# Patient Record
Sex: Male | Born: 1985 | Race: White | Hispanic: No | Smoking: Current every day smoker
Health system: Southern US, Community
[De-identification: ages and names within clinical notes are randomized; demographics above are authoritative.]

## PROBLEM LIST (undated history)

## (undated) DIAGNOSIS — J45909 Unspecified asthma, uncomplicated: Secondary | ICD-10-CM

## (undated) DIAGNOSIS — K439 Ventral hernia without obstruction or gangrene: Secondary | ICD-10-CM

## (undated) DIAGNOSIS — F1910 Other psychoactive substance abuse, uncomplicated: Secondary | ICD-10-CM

## (undated) DIAGNOSIS — K219 Gastro-esophageal reflux disease without esophagitis: Secondary | ICD-10-CM

## (undated) DIAGNOSIS — R000 Tachycardia, unspecified: Secondary | ICD-10-CM

## (undated) DIAGNOSIS — M1990 Unspecified osteoarthritis, unspecified site: Secondary | ICD-10-CM

## (undated) DIAGNOSIS — R0600 Dyspnea, unspecified: Secondary | ICD-10-CM

## (undated) DIAGNOSIS — J189 Pneumonia, unspecified organism: Secondary | ICD-10-CM

## (undated) DIAGNOSIS — I2089 Other forms of angina pectoris: Secondary | ICD-10-CM

## (undated) DIAGNOSIS — I208 Other forms of angina pectoris: Secondary | ICD-10-CM

## (undated) HISTORY — PX: HERNIA REPAIR: SHX51

## (undated) HISTORY — PX: OTHER SURGICAL HISTORY: SHX169

---
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
## 2004-05-02 ENCOUNTER — Emergency Department: Payer: Self-pay | Admitting: Emergency Medicine

## 2004-07-29 ENCOUNTER — Emergency Department: Payer: Self-pay | Admitting: Unknown Physician Specialty

## 2004-09-05 ENCOUNTER — Emergency Department: Payer: Self-pay | Admitting: General Practice

## 2004-12-30 ENCOUNTER — Emergency Department: Payer: Self-pay | Admitting: Unknown Physician Specialty

## 2006-02-21 ENCOUNTER — Emergency Department: Payer: Self-pay | Admitting: Emergency Medicine

## 2006-05-04 ENCOUNTER — Emergency Department: Payer: Self-pay | Admitting: Emergency Medicine

## 2006-11-17 ENCOUNTER — Emergency Department: Payer: Self-pay | Admitting: Emergency Medicine

## 2007-01-01 ENCOUNTER — Emergency Department: Payer: Self-pay | Admitting: Emergency Medicine

## 2007-10-02 ENCOUNTER — Emergency Department: Payer: Self-pay | Admitting: Emergency Medicine

## 2008-09-10 ENCOUNTER — Emergency Department: Payer: Self-pay | Admitting: Emergency Medicine

## 2009-07-28 ENCOUNTER — Emergency Department: Payer: Self-pay | Admitting: Internal Medicine

## 2009-10-30 ENCOUNTER — Emergency Department (HOSPITAL_COMMUNITY): Admission: EM | Admit: 2009-10-30 | Discharge: 2009-10-30 | Payer: Self-pay | Admitting: Emergency Medicine

## 2009-10-31 ENCOUNTER — Emergency Department (HOSPITAL_COMMUNITY): Admission: EM | Admit: 2009-10-31 | Discharge: 2009-10-31 | Payer: Self-pay | Admitting: Emergency Medicine

## 2010-03-12 ENCOUNTER — Emergency Department (HOSPITAL_COMMUNITY)
Admission: EM | Admit: 2010-03-12 | Discharge: 2010-03-12 | Payer: Self-pay | Source: Home / Self Care | Admitting: Emergency Medicine

## 2010-05-09 ENCOUNTER — Emergency Department: Payer: Self-pay | Admitting: Emergency Medicine

## 2010-05-19 ENCOUNTER — Emergency Department: Payer: Self-pay | Admitting: Emergency Medicine

## 2010-05-26 ENCOUNTER — Emergency Department (HOSPITAL_COMMUNITY)
Admission: EM | Admit: 2010-05-26 | Discharge: 2010-05-26 | Payer: Self-pay | Source: Home / Self Care | Admitting: Emergency Medicine

## 2010-08-16 LAB — RPR: RPR Ser Ql: NONREACTIVE

## 2010-08-28 ENCOUNTER — Emergency Department (HOSPITAL_COMMUNITY)
Admission: EM | Admit: 2010-08-28 | Discharge: 2010-08-29 | Disposition: A | Discharge status: Home / Self Care | Payer: Self-pay | Attending: Emergency Medicine | Admitting: Emergency Medicine

## 2010-08-28 DIAGNOSIS — E860 Dehydration: Secondary | ICD-10-CM | POA: Insufficient documentation

## 2010-08-28 DIAGNOSIS — R5383 Other fatigue: Secondary | ICD-10-CM | POA: Insufficient documentation

## 2010-08-28 DIAGNOSIS — IMO0001 Reserved for inherently not codable concepts without codable children: Secondary | ICD-10-CM | POA: Insufficient documentation

## 2010-08-28 DIAGNOSIS — R5381 Other malaise: Secondary | ICD-10-CM | POA: Insufficient documentation

## 2010-08-28 LAB — COMPREHENSIVE METABOLIC PANEL
ALT: 16 U/L (ref 0–53)
AST: 22 U/L (ref 0–37)
Albumin: 4 g/dL (ref 3.5–5.2)
Alkaline Phosphatase: 91 U/L (ref 39–117)
BUN: 8 mg/dL (ref 6–23)
CO2: 27 mEq/L (ref 19–32)
Calcium: 9.1 mg/dL (ref 8.4–10.5)
Chloride: 96 mEq/L (ref 96–112)
Creatinine, Ser: 1.06 mg/dL (ref 0.4–1.5)
GFR calc Af Amer: >60 mL/min (ref >60)
GFR calc non Af Amer: >60 mL/min (ref >60)
Glucose, Bld: 102 mg/dL — ABNORMAL HIGH (ref 70–99)
Potassium: 3.6 mEq/L (ref 3.5–5.1)
Sodium: 132 mEq/L — ABNORMAL LOW (ref 135–145)
Total Bilirubin: 0.3 mg/dL (ref 0.3–1.2)
Total Protein: 7.1 g/dL (ref 6.0–8.3)

## 2010-08-28 LAB — URINALYSIS, ROUTINE W REFLEX MICROSCOPIC
Bilirubin Urine: NEGATIVE
Nitrite: NEGATIVE
Specific Gravity, Urine: 1.01 (ref 1.005–1.030)
Urobilinogen, UA: 1 mg/dL (ref 0.0–1.0)
pH: 7 (ref 5.0–8.0)

## 2010-08-28 LAB — CBC
HCT: 40.1 % (ref 39.0–52.0)
MCV: 90.5 fL (ref 78.0–100.0)
Platelets: 249 10*3/uL (ref 150–400)
RBC: 4.43 MIL/uL (ref 4.22–5.81)
WBC: 11.4 10*3/uL — ABNORMAL HIGH (ref 4.0–10.5)

## 2010-08-28 LAB — DIFFERENTIAL
Basophils Absolute: 0.1 10*3/uL (ref 0.0–0.1)
Eosinophils Absolute: 0.2 10*3/uL (ref 0.0–0.7)
Lymphocytes Relative: 12 % (ref 12–46)
Lymphs Abs: 1.3 10*3/uL (ref 0.7–4.0)
Neutrophils Relative %: 79 % — ABNORMAL HIGH (ref 43–77)

## 2011-04-22 ENCOUNTER — Emergency Department: Payer: Self-pay | Admitting: Emergency Medicine

## 2011-08-02 ENCOUNTER — Emergency Department: Payer: Self-pay | Admitting: Emergency Medicine

## 2011-08-02 LAB — CBC WITH DIFFERENTIAL/PLATELET
Basophil %: 1.2 %
Eosinophil %: 6.3 %
HCT: 40 % (ref 40.0–52.0)
Lymphocyte %: 36.4 %
Monocyte %: 7.8 %
Neutrophil %: 48.3 %
RDW: 13.7 % (ref 11.5–14.5)
WBC: 7 10*3/uL (ref 3.8–10.6)

## 2011-08-02 LAB — URINALYSIS, COMPLETE
Bacteria: NONE SEEN
Bilirubin,UR: NEGATIVE
Glucose,UR: NEGATIVE mg/dL (ref 0–75)
Ketone: NEGATIVE
Leukocyte Esterase: NEGATIVE
Ph: 7 (ref 4.5–8.0)
RBC,UR: 1 /HPF (ref 0–5)
Squamous Epithelial: NONE SEEN
WBC UR: NONE SEEN /HPF (ref 0–5)

## 2011-08-02 LAB — BASIC METABOLIC PANEL
Anion Gap: 12 (ref 7–16)
BUN: 9 mg/dL (ref 7–18)
Calcium, Total: 9 mg/dL (ref 8.5–10.1)
Chloride: 103 mmol/L (ref 98–107)
Co2: 26 mmol/L (ref 21–32)
Osmolality: 280 (ref 275–301)

## 2011-08-04 LAB — URINE CULTURE

## 2012-03-22 ENCOUNTER — Emergency Department: Payer: Self-pay | Admitting: Emergency Medicine

## 2012-08-25 ENCOUNTER — Emergency Department: Payer: Self-pay | Admitting: Unknown Physician Specialty

## 2012-08-25 LAB — CBC WITH DIFFERENTIAL/PLATELET
Basophil #: 0.1 10*3/uL (ref 0.0–0.1)
Basophil %: 1.4 %
Eosinophil #: 0.4 10*3/uL (ref 0.0–0.7)
Eosinophil %: 6.4 %
HCT: 39.5 % — ABNORMAL LOW (ref 40.0–52.0)
MCH: 31 pg (ref 26.0–34.0)
MCHC: 33.3 g/dL (ref 32.0–36.0)
MCV: 93 fL (ref 80–100)
Monocyte #: 0.5 x10 3/mm (ref 0.2–1.0)
Monocyte %: 8.5 %
Neutrophil #: 2.8 10*3/uL (ref 1.4–6.5)
Neutrophil %: 45.9 %
WBC: 6 10*3/uL (ref 3.8–10.6)

## 2012-08-25 LAB — BASIC METABOLIC PANEL
BUN: 13 mg/dL (ref 7–18)
Co2: 30 mmol/L (ref 21–32)
EGFR (Non-African Amer.): >60
Osmolality: 275 (ref 275–301)
Potassium: 3.9 mmol/L (ref 3.5–5.1)

## 2012-09-20 ENCOUNTER — Emergency Department: Payer: Self-pay | Admitting: Emergency Medicine

## 2012-09-20 LAB — COMPREHENSIVE METABOLIC PANEL
Albumin: 4.6 g/dL (ref 3.4–5.0)
Alkaline Phosphatase: 84 U/L (ref 50–136)
Anion Gap: 8 (ref 7–16)
BUN: 8 mg/dL (ref 7–18)
Bilirubin,Total: 0.3 mg/dL (ref 0.2–1.0)
Chloride: 108 mmol/L — ABNORMAL HIGH (ref 98–107)
Co2: 25 mmol/L (ref 21–32)
EGFR (African American): >60
EGFR (Non-African Amer.): >60
SGOT(AST): 26 U/L (ref 15–37)

## 2012-09-20 LAB — CBC
HGB: 15.2 g/dL (ref 13.0–18.0)
MCH: 32.6 pg (ref 26.0–34.0)
MCHC: 34.9 g/dL (ref 32.0–36.0)
Platelet: 297 10*3/uL (ref 150–440)
WBC: 7.3 10*3/uL (ref 3.8–10.6)

## 2012-09-20 LAB — DRUG SCREEN, URINE
Amphetamines, Ur Screen: NEGATIVE (ref ?–1000)
Barbiturates, Ur Screen: NEGATIVE (ref ?–200)
Benzodiazepine, Ur Scrn: NEGATIVE (ref ?–200)
Cannabinoid 50 Ng, Ur ~~LOC~~: POSITIVE (ref ?–50)
Cocaine Metabolite,Ur ~~LOC~~: NEGATIVE (ref ?–300)
MDMA (Ecstasy)Ur Screen: NEGATIVE (ref ?–500)
Methadone, Ur Screen: NEGATIVE (ref ?–300)
Opiate, Ur Screen: NEGATIVE (ref ?–300)

## 2012-09-20 LAB — TSH: Thyroid Stimulating Horm: 3.17 u[IU]/mL

## 2012-09-20 LAB — URINALYSIS, COMPLETE
Bacteria: NONE SEEN
Glucose,UR: NEGATIVE mg/dL (ref 0–75)
Leukocyte Esterase: NEGATIVE
Nitrite: NEGATIVE
Ph: 5 (ref 4.5–8.0)
RBC,UR: 74 /HPF (ref 0–5)
Squamous Epithelial: <1
WBC UR: NONE SEEN /HPF (ref 0–5)

## 2012-09-20 LAB — ETHANOL: Ethanol: 143 mg/dL

## 2013-01-14 ENCOUNTER — Emergency Department: Payer: Self-pay | Admitting: Emergency Medicine

## 2013-01-14 LAB — COMPREHENSIVE METABOLIC PANEL
Albumin: 3.9 g/dL (ref 3.4–5.0)
Alkaline Phosphatase: 103 U/L (ref 50–136)
BUN: 12 mg/dL (ref 7–18)
Chloride: 105 mmol/L (ref 98–107)
EGFR (Non-African Amer.): >60
Glucose: 73 mg/dL (ref 65–99)
SGOT(AST): 25 U/L (ref 15–37)
SGPT (ALT): 25 U/L (ref 12–78)

## 2013-01-14 LAB — CBC WITH DIFFERENTIAL/PLATELET
Basophil %: 5 %
Lymphocyte %: 37.1 %
MCH: 32 pg (ref 26.0–34.0)
Monocyte #: 0.4 x10 3/mm (ref 0.2–1.0)
Platelet: 261 10*3/uL (ref 150–440)
RBC: 4.57 10*6/uL (ref 4.40–5.90)
RDW: 12.8 % (ref 11.5–14.5)

## 2013-12-21 ENCOUNTER — Emergency Department: Payer: Self-pay | Admitting: Emergency Medicine

## 2014-06-07 ENCOUNTER — Emergency Department: Payer: Self-pay | Admitting: Emergency Medicine

## 2017-01-17 ENCOUNTER — Encounter: Payer: Self-pay | Admitting: Emergency Medicine

## 2017-01-17 ENCOUNTER — Emergency Department: Payer: Self-pay

## 2017-01-17 ENCOUNTER — Emergency Department
Admission: EM | Admit: 2017-01-17 | Discharge: 2017-01-17 | Disposition: A | Discharge status: Home / Self Care | Payer: Self-pay | Attending: Emergency Medicine | Admitting: Emergency Medicine

## 2017-01-17 DIAGNOSIS — R079 Chest pain, unspecified: Secondary | ICD-10-CM | POA: Insufficient documentation

## 2017-01-17 DIAGNOSIS — F17200 Nicotine dependence, unspecified, uncomplicated: Secondary | ICD-10-CM | POA: Insufficient documentation

## 2017-01-17 LAB — TROPONIN I: Troponin I: <0.03 ng/mL (ref <0.03)

## 2017-01-17 LAB — BASIC METABOLIC PANEL
ANION GAP: 8 (ref 5–15)
BUN: 20 mg/dL (ref 6–20)
CO2: 27 mmol/L (ref 22–32)
Calcium: 9.6 mg/dL (ref 8.9–10.3)
Chloride: 101 mmol/L (ref 101–111)
Creatinine, Ser: 1.21 mg/dL (ref 0.61–1.24)
GFR calc Af Amer: >60 mL/min (ref >60)
GFR calc non Af Amer: >60 mL/min (ref >60)
GLUCOSE: 97 mg/dL (ref 65–99)
POTASSIUM: 3.7 mmol/L (ref 3.5–5.1)
Sodium: 136 mmol/L (ref 135–145)

## 2017-01-17 LAB — CBC
HEMATOCRIT: 42.6 % (ref 40.0–52.0)
HEMOGLOBIN: 14.8 g/dL (ref 13.0–18.0)
MCH: 32.2 pg (ref 26.0–34.0)
MCHC: 34.7 g/dL (ref 32.0–36.0)
MCV: 92.9 fL (ref 80.0–100.0)
PLATELETS: 336 10*3/uL (ref 150–440)
RBC: 4.59 MIL/uL (ref 4.40–5.90)
RDW: 13.8 % (ref 11.5–14.5)
WBC: 6.2 10*3/uL (ref 3.8–10.6)

## 2017-01-17 MED ORDER — IBUPROFEN 600 MG PO TABS: 600.0000 mg | ORAL_TABLET | Freq: Four times a day (QID) | ORAL | 0 refills | Status: DC | PRN

## 2017-01-17 MED ORDER — KETOROLAC TROMETHAMINE 60 MG/2ML IM SOLN
60.0000 mg | Freq: Once | INTRAMUSCULAR | Status: AC
  Administered 2017-01-17: 60 mg via INTRAMUSCULAR
  Filled 2017-01-17: qty 2

## 2017-01-17 NOTE — ED Provider Notes (Signed)
[facility] Emergency Department Provider Note  Time seen: 6:44 PM  I have reviewed the triage vital signs and the nursing notes.   HISTORY  Chief Complaint Chest Pain and Pleurisy    HPI Kenneth Bautista is a 31 y.o. male with no past medical history presents to the emergency department for chest pain. According to the patient since yesterday morning he has been experiencing central chest discomfort which he states is worse with movement, deep breath or. Presses on the area. Denies any cough or congestion. Denies any leg pain or swelling. Denies fever. Denies any increased exertion. Denies any trauma. Describes his chest pain is mild to moderate sharp type pain.  History reviewed. No pertinent past medical history.  There are no active problems to display for this patient.   History reviewed. No pertinent surgical history.  Prior to Admission medications   Not on File    No Known Allergies  No family history on file.  Social History Social History  Substance Use Topics  . Smoking status: Current Every Day Smoker  . Smokeless tobacco: Never Used  . Alcohol use Yes    Review of Systems Constitutional: Negative for fever. Cardiovascular: Positive for central chest pain worse with palpation or movement Respiratory: Negative for shortness of breath. Gastrointestinal: Negative for abdominal pain Musculoskeletal: No leg pain or swelling. Neurological: Negative for headache All other ROS negative  ____________________________________________   PHYSICAL EXAM:  VITAL SIGNS: ED Triage Vitals [01/17/17 1611]  Enc Vitals Group     BP 122/71     Pulse Rate 61     Resp 20     Temp 98.2 F (36.8 C)     Temp Source Oral     SpO2 100 %     Weight 140 lb (63.5 kg)     Height 5\' 6"  (1.676 m)     Head Circumference      Peak Flow      Pain Score 6     Pain Loc      Pain Edu?      Excl. in GC?     Constitutional: Alert and oriented. Well  appearing and in no distress. Eyes: Normal exam ENT   Head: Normocephalic and atraumatic   Mouth/Throat: Mucous membranes are moist. Cardiovascular: Normal rate, regular rhythm. No murmur Respiratory: Normal respiratory effort without tachypnea nor retractions. Breath sounds are clear. Extremely reproducible central chest pain. Gastrointestinal: Soft and nontender. No distention.   Musculoskeletal: Nontender with normal range of motion in all extremities. No leg edema or tenderness. Neurologic:  Normal speech and language. No gross focal neurologic deficits  Skin:  Skin is warm, dry and intact.  Psychiatric: Mood and affect are normal.  ____________________________________________    EKG  EKG reviewed and interpreted by myself shows normal sinus rhythm at 63 bpm, narrow QRS, normal axis, normal intervals, no ST changes. Normal EKG  ____________________________________________    RADIOLOGY  X-ray negative  ____________________________________________   INITIAL IMPRESSION / ASSESSMENT AND PLAN / ED COURSE  Pertinent labs & imaging results that were available during my care of the patient were reviewed by me and considered in my medical decision making (see chart for details).  Patient presents to the emergency department with chest pain, chest pain is extremely reproducible. Patient's labs are negative including cardiac enzymes. Chest x-ray negative. We will monitor on cardiac monitor, does Toradol for pain control. Reassuringly the patient's vitals are normal. PERC negative.  Patient state his pain  is much improved after Toradol. We will discharge home with ibuprofen. Patient's pain very suggestive Musca skeletal pain.  ____________________________________________   FINAL CLINICAL IMPRESSION(S) / ED DIAGNOSES  Chest pain   Minna Antis, MD 01/17/17 [phone]

## 2017-01-17 NOTE — Discharge Instructions (Signed)
You have been seen in the emergency department today for chest pain. Your workup has shown normal results. As we discussed please follow-up with your primary care physician in the next 1-2 days for recheck. Return to the emergency department for any further chest pain, trouble breathing, or any other symptom personally concerning to yourself. °

## 2017-01-17 NOTE — ED Triage Notes (Signed)
Presents with upper chest discomfort for 2 days   States pain is increased with inspiration and touch

## 2018-07-25 ENCOUNTER — Encounter: Payer: Self-pay | Admitting: Emergency Medicine

## 2018-07-25 ENCOUNTER — Emergency Department: Payer: Self-pay

## 2018-07-25 ENCOUNTER — Emergency Department
Admission: EM | Admit: 2018-07-25 | Discharge: 2018-07-25 | Disposition: A | Discharge status: Home / Self Care | Payer: Self-pay | Attending: Emergency Medicine | Admitting: Emergency Medicine

## 2018-07-25 ENCOUNTER — Other Ambulatory Visit: Payer: Self-pay

## 2018-07-25 DIAGNOSIS — F17210 Nicotine dependence, cigarettes, uncomplicated: Secondary | ICD-10-CM | POA: Insufficient documentation

## 2018-07-25 DIAGNOSIS — J209 Acute bronchitis, unspecified: Secondary | ICD-10-CM | POA: Insufficient documentation

## 2018-07-25 MED ORDER — AZITHROMYCIN 500 MG PO TABS
500.0000 mg | ORAL_TABLET | Freq: Once | ORAL | Status: AC
  Administered 2018-07-25: 500 mg via ORAL
  Filled 2018-07-25: qty 1

## 2018-07-25 MED ORDER — ALBUTEROL SULFATE HFA 108 (90 BASE) MCG/ACT IN AERS: 2.0000 | INHALATION_SPRAY | Freq: Four times a day (QID) | RESPIRATORY_TRACT | 0 refills | Status: DC | PRN

## 2018-07-25 MED ORDER — BENZONATATE 100 MG PO CAPS: 100.0000 mg | ORAL_CAPSULE | Freq: Three times a day (TID) | ORAL | 0 refills | Status: DC | PRN

## 2018-07-25 MED ORDER — ALBUTEROL SULFATE (2.5 MG/3ML) 0.083% IN NEBU: 3.0000 mL | INHALATION_SOLUTION | Freq: Once | RESPIRATORY_TRACT | Status: DC

## 2018-07-25 MED ORDER — AZITHROMYCIN 500 MG PO TABS: 500.0000 mg | ORAL_TABLET | Freq: Every day | ORAL | 0 refills | Status: AC

## 2018-07-25 MED ORDER — BENZONATATE 100 MG PO CAPS
100.0000 mg | ORAL_CAPSULE | Freq: Once | ORAL | Status: AC
  Administered 2018-07-25: 100 mg via ORAL
  Filled 2018-07-25: qty 1

## 2018-07-25 NOTE — ED Provider Notes (Signed)
[facility] Emergency Department Provider Note ____   First MD Initiated Contact with Patient 07/25/18 2251     (approximate)  I have reviewed the triage vital signs and the nursing notes.   HISTORY  Chief Complaint Chest Pain and Cough   HPI Kenneth Bautista is a 33 y.o. male   presents to the emergency department 1 day history of cough congestion.  Patient states half a pack per day cigarette smoking.  Patient denies any fever afebrile on presentation temperature 98.7.  Patient denies taking any medication at home for his symptoms.    History reviewed. No pertinent past medical history.  There are no active problems to display for this patient.   History reviewed. No pertinent surgical history.  Prior to Admission medications   Medication Sig Start Date End Date Taking? Authorizing Provider  ibuprofen (ADVIL,MOTRIN) 600 MG tablet Take 1 tablet (600 mg total) by mouth every 6 (six) hours as needed. 01/17/17   Minna Antis, MD    Allergies Patient has no known allergies.  History reviewed. No pertinent family history.  Social History Social History   Tobacco Use  . Smoking status: Current Every Day Smoker  . Smokeless tobacco: Never Used  Substance Use Topics  . Alcohol use: Yes  . Drug use: Yes    Types: Marijuana    Review of Systems Constitutional: No fever/chills Eyes: No visual changes. ENT: No sore throat. Cardiovascular: Denies chest pain. Respiratory: Denies shortness of breath. Positive for cough Gastrointestinal: No abdominal pain.  No nausea, no vomiting.  No diarrhea.  No constipation. Genitourinary: Negative for dysuria. Musculoskeletal: Negative for neck pain.  Negative for back pain. Integumentary: Negative for rash. Neurological: Negative for headaches, focal weakness or numbness.  ____________________________________________   PHYSICAL EXAM:  VITAL SIGNS: ED Triage Vitals  Enc Vitals Group     BP  07/25/18 1717 117/60     Pulse Rate 07/25/18 1717 77     Resp 07/25/18 1717 (!) 22     Temp 07/25/18 1717 98.7 F (37.1 C)     Temp Source 07/25/18 1717 Oral     SpO2 07/25/18 1717 99 %     Weight 07/25/18 1718 65.8 kg (145 lb)     Height 07/25/18 1718 1.676 m (5\' 6" )     Head Circumference --      Peak Flow --      Pain Score 07/25/18 1718 8     Pain Loc --      Pain Edu? --      Excl. in GC? --     Constitutional: Alert and oriented. Well appearing and in no acute distress. Eyes: Conjunctivae are normal. PERRL. EOMI. Mouth/Throat: Mucous membranes are moist. Oropharynx non-erythematous. Neck: No stridor.   Cardiovascular: Normal rate, regular rhythm. Good peripheral circulation. Grossly normal heart sounds. Respiratory: Normal respiratory effort.  No retractions. Lungs CTAB. Gastrointestinal: Soft and nontender. No distention.  Musculoskeletal: No lower extremity tenderness nor edema. No gross deformities of extremities. Neurologic:  Normal speech and language. No gross focal neurologic deficits are appreciated.  Skin:  Skin is warm, dry and intact. No rash noted. Psychiatric: Mood and affect are normal. Speech and behavior are normal.  ___________  EKG ED ECG REPORT I, Mahar[place] BROWN, the attending physician, personally viewed and interpreted this ECG.   Date: 07/25/2018  EKG Time: 5:27 PM  Rate: 78  Rhythm: Normal sinus rhythm  Axis: Normal  Intervals: Normal  ST&T Change: None  __________________  RADIOLOGY I, Darci Current, personally viewed and evaluated these images (plain radiographs) as part of my medical decision making, as well as reviewing the written report by the radiologist.  ED MD interpretation: Mild bronchitic changes on chest x-ray per radiologist  Official radiology report(s): Dg Chest 2 View  Result Date: 07/25/2018 CLINICAL DATA:  Cough, chest pain EXAM: CHEST - 2 VIEW COMPARISON:  05/25/2018 FINDINGS: Mild peribronchial thickening.  Heart and mediastinal contours are within normal limits. No focal opacities or effusions. No acute bony abnormality. IMPRESSION: Mild bronchitic changes. Electronically Signed   By: Charlett Nose M.D.   On: 07/25/2018 18:46    ____________________________________________    Procedures   ____________________________________________   INITIAL IMPRESSION / MDM / ASSESSMENT AND PLAN / ED COURSE  As part of my medical decision making, I reviewed the following data within the electronic MEDICAL RECORD NUMBER   33 year old male presenting with above-stated history and physical exam with differential diagnosis including pneumonia versus bronchitis.  Chest x-ray consistent with bronchitis.  Patient given Tessalon and azithromycin and albuterol inhaler with recommendation to discontinue smoking. ____________________________________________  FINAL CLINICAL IMPRESSION(S) / ED DIAGNOSES  Final diagnoses:  Acute bronchitis, unspecified organism     MEDICATIONS GIVEN DURING THIS VISIT:  Medications  albuterol (PROVENTIL HFA;VENTOLIN HFA) 108 (90 Base) MCG/ACT inhaler 2 puff (has no administration in time range)  azithromycin (ZITHROMAX) tablet 500 mg (has no administration in time range)  benzonatate (TESSALON) capsule 100 mg (has no administration in time range)     ED Discharge Orders    None       Note:  This document was prepared using Dragon voice recognition software and may include unintentional dictation errors.   Darci Current, MD 07/25/18 [phone]

## 2018-07-25 NOTE — ED Triage Notes (Signed)
Pt to ER with c/o cough, chest pain that worsens with deep inspiration.  PT states cough becomes worse when he walks.  PT states "slept all day yesterday because of this".  Pt reports cough productive of white sputum.

## 2019-06-24 ENCOUNTER — Ambulatory Visit: Payer: Self-pay | Attending: Internal Medicine

## 2019-06-24 DIAGNOSIS — Z8616 Personal history of COVID-19: Secondary | ICD-10-CM

## 2019-06-24 DIAGNOSIS — Z20822 Contact with and (suspected) exposure to covid-19: Secondary | ICD-10-CM

## 2019-06-24 DIAGNOSIS — U071 COVID-19: Secondary | ICD-10-CM | POA: Insufficient documentation

## 2019-06-24 HISTORY — DX: Personal history of COVID-19: Z86.16

## 2019-06-25 LAB — NOVEL CORONAVIRUS, NAA: SARS-CoV-2, NAA: DETECTED — AB

## 2019-06-28 ENCOUNTER — Other Ambulatory Visit: Payer: Self-pay

## 2019-06-28 ENCOUNTER — Emergency Department
Admission: EM | Admit: 2019-06-28 | Discharge: 2019-06-28 | Disposition: A | Discharge status: Home / Self Care | Payer: Self-pay | Attending: Emergency Medicine | Admitting: Emergency Medicine

## 2019-06-28 ENCOUNTER — Emergency Department: Payer: Self-pay

## 2019-06-28 DIAGNOSIS — F17200 Nicotine dependence, unspecified, uncomplicated: Secondary | ICD-10-CM | POA: Insufficient documentation

## 2019-06-28 DIAGNOSIS — U071 COVID-19: Secondary | ICD-10-CM | POA: Insufficient documentation

## 2019-06-28 DIAGNOSIS — J45909 Unspecified asthma, uncomplicated: Secondary | ICD-10-CM | POA: Insufficient documentation

## 2019-06-28 HISTORY — DX: Unspecified osteoarthritis, unspecified site: M19.90

## 2019-06-28 LAB — BASIC METABOLIC PANEL
Anion gap: 10 (ref 5–15)
BUN: 12 mg/dL (ref 6–20)
CO2: 27 mmol/L (ref 22–32)
Calcium: 8.8 mg/dL — ABNORMAL LOW (ref 8.9–10.3)
Chloride: 102 mmol/L (ref 98–111)
Creatinine, Ser: 0.96 mg/dL (ref 0.61–1.24)
GFR calc Af Amer: >60 mL/min (ref >60)
GFR calc non Af Amer: >60 mL/min (ref >60)
Glucose, Bld: 78 mg/dL (ref 70–99)
Potassium: 3.6 mmol/L (ref 3.5–5.1)
Sodium: 139 mmol/L (ref 135–145)

## 2019-06-28 LAB — CBC
HCT: 42.5 % (ref 39.0–52.0)
Hemoglobin: 14.6 g/dL (ref 13.0–17.0)
MCH: 31.7 pg (ref 26.0–34.0)
MCHC: 34.4 g/dL (ref 30.0–36.0)
MCV: 92.4 fL (ref 80.0–100.0)
Platelets: 279 10*3/uL (ref 150–400)
RBC: 4.6 MIL/uL (ref 4.22–5.81)
RDW: 12.9 % (ref 11.5–15.5)
WBC: 5.6 10*3/uL (ref 4.0–10.5)
nRBC: 0 % (ref 0.0–0.2)

## 2019-06-28 LAB — TROPONIN I (HIGH SENSITIVITY): Troponin I (High Sensitivity): <2 ng/L (ref <18)

## 2019-06-28 MED ORDER — ALBUTEROL SULFATE HFA 108 (90 BASE) MCG/ACT IN AERS: 2.0000 | INHALATION_SPRAY | Freq: Four times a day (QID) | RESPIRATORY_TRACT | 0 refills | Status: DC | PRN

## 2019-06-28 MED ORDER — AZITHROMYCIN 250 MG PO TABS: ORAL_TABLET | ORAL | 0 refills | Status: DC

## 2019-06-28 MED ORDER — SODIUM CHLORIDE 0.9% FLUSH: 3.0000 mL | Freq: Once | INTRAVENOUS | Status: DC

## 2019-06-28 MED ORDER — PREDNISONE 10 MG (21) PO TBPK: ORAL_TABLET | ORAL | 0 refills | Status: DC

## 2019-06-28 NOTE — ED Notes (Signed)
See triage note  Presents with some chest discomfort   States was tested positive for COVID last Friday  But states having sharp pain to chest   Afebrile on arrival

## 2019-06-28 NOTE — ED Triage Notes (Signed)
Pt comes via POV from home with c/o chest pain and SOB. Pt states COVID+ result on Friday.  Pt states pain in chest at 7/10 that is sharp pain. Pt states central chest and no radiation.  Pt states hx of asthma.

## 2019-06-28 NOTE — ED Provider Notes (Signed)
[name] Emergency Department Provider Note  ____________________________________________   First MD Initiated Contact with Patient 06/28/19 1718     (approximate)  I have reviewed the triage vital signs and the nursing notes.   HISTORY  Chief Complaint Chest Pain and COVID+    HPI Kenneth Bautista is a 34 y.o. male presents emergency department complaining of chest discomfort.  Tested positive for Covid on Friday.  No fever or chills.  No shortness of breath.  States has a history of asthma and does not have any medication.  No swelling in extremities.  No vomiting or diarrhea.    Past Medical History:  Diagnosis Date  . Arthritis     There are no problems to display for this patient.   History reviewed. No pertinent surgical history.  Prior to Admission medications   Medication Sig Start Date End Date Taking? Authorizing Provider  albuterol (VENTOLIN HFA) 108 (90 Base) MCG/ACT inhaler Inhale 2 puffs into the lungs every 6 (six) hours as needed for wheezing or shortness of breath. 06/28/19   Sherrie Mustache, Roselyn Bering, PA-C  azithromycin (ZITHROMAX Z-PAK) 250 MG tablet 2 pills today then 1 pill a day for 4 days 06/28/19   Sherrie Mustache Roselyn Bering, PA-C  predniSONE (STERAPRED UNI-PAK 21 TAB) 10 MG (21) TBPK tablet Take 6 pills on day one then decrease by 1 pill each day 06/28/19   Faythe Ghee, PA-C    Allergies Patient has no known allergies.  No family history on file.  Social History Social History   Tobacco Use  . Smoking status: Current Every Day Smoker  . Smokeless tobacco: Never Used  Substance Use Topics  . Alcohol use: Yes  . Drug use: Not Currently    Types: Marijuana    Review of Systems  Constitutional: No fever/chills Eyes: No visual changes. ENT: No sore throat. Respiratory: Denies cough Cardiovascular: Positive chest pain Gastrointestinal: Denies abdominal pain Genitourinary: Negative for dysuria. Musculoskeletal: Negative for back  pain. Skin: Negative for rash. Psychiatric: no mood changes,     ____________________________________________   PHYSICAL EXAM:  VITAL SIGNS: ED Triage Vitals  Enc Vitals Group     BP 06/28/19 1621 (!) 158/91     Pulse Rate 06/28/19 1621 79     Resp 06/28/19 1621 19     Temp 06/28/19 1621 98 F (36.7 C)     Temp Source 06/28/19 1621 Oral     SpO2 06/28/19 1621 100 %     Weight 06/28/19 1622 145 lb (65.8 kg)     Height 06/28/19 1622 5\' 6"  (1.676 m)     Head Circumference --      Peak Flow --      Pain Score 06/28/19 1622 7     Pain Loc --      Pain Edu? --      Excl. in GC? --     Constitutional: Alert and oriented. Well appearing and in no acute distress. Eyes: Conjunctivae are normal.  Head: Atraumatic. Nose: No congestion/rhinnorhea. Mouth/Throat: Mucous membranes are moist.   Neck:  supple no lymphadenopathy noted Cardiovascular: Normal rate, regular rhythm. Heart sounds are normal Respiratory: Normal respiratory effort.  No retractions, lungs c t a  GU: deferred Musculoskeletal: FROM all extremities, warm and well perfused Neurologic:  Normal speech and language.  Skin:  Skin is warm, dry and intact. No rash noted. Psychiatric: Mood and affect are normal. Speech and behavior are normal.  ____________________________________________   LABS (all  labs ordered are listed, but only abnormal results are displayed)  Labs Reviewed  BASIC METABOLIC PANEL - Abnormal; Notable for the following components:      Result Value   Calcium 8.8 (*)    All other components within normal limits  CBC  TROPONIN I (HIGH SENSITIVITY)   ____________________________________________   ____________________________________________  RADIOLOGY  Chest x-ray is normal  ____________________________________________   PROCEDURES  Procedure(s) performed: EKG shows normal sinus rhythm   Procedures    ____________________________________________   INITIAL IMPRESSION /  ASSESSMENT AND PLAN / ED COURSE  Pertinent labs & imaging results that were available during my care of the patient were reviewed by me and considered in my medical decision making (see chart for details).   Patient is 34 year old male presents emergency department with positive Covid test and some chest discomfort.  See HPI  Physical exam patient appears very well.  Vitals are normal.  No acute distress.  Remainder the exam is unremarkable  DDx: Covid, CAP, MI  CBC is normal, basic metabolic panel is normal troponin is normal, chest x-ray is normal, EKG has normal sinus rhythm  Explained all the findings to the patient.  Explained to him that everything is normal.  It is normal for him to have some discomfort in his chest due to Covid.  He is to follow-up with his regular doctor return emergency department if worsening.  He was given a prescription for Z-Pak, steroid pack, and albuterol inhaler.  Patient states he does not have any money so I did send this to medication management in hopes that they will be able to help him get his medication.  He was discharged in stable condition.    Kenneth Bautista was evaluated in Emergency Department on 06/28/2019 for the symptoms described in the history of present illness. He was evaluated in the context of the global COVID-19 pandemic, which necessitated consideration that the patient might be at risk for infection with the SARS-CoV-2 virus that causes COVID-19. Institutional protocols and algorithms that pertain to the evaluation of patients at risk for COVID-19 are in a state of rapid change based on information released by regulatory bodies including the CDC and federal and state organizations. These policies and algorithms were followed during the patient's care in the ED.   As part of my medical decision making, I reviewed the following data within the [facility] notes reviewed and incorporated, Labs reviewed the above, EKG  interpreted NSR, Old chart reviewed, Radiograph reviewed chest x-ray normal, Notes from prior ED visits and [place] Controlled Substance Database  ____________________________________________   FINAL CLINICAL IMPRESSION(S) / ED DIAGNOSES  Final diagnoses:  COVID-19      NEW MEDICATIONS STARTED DURING THIS VISIT:  New Prescriptions   ALBUTEROL (VENTOLIN HFA) 108 (90 BASE) MCG/ACT INHALER    Inhale 2 puffs into the lungs every 6 (six) hours as needed for wheezing or shortness of breath.   AZITHROMYCIN (ZITHROMAX Z-PAK) 250 MG TABLET    2 pills today then 1 pill a day for 4 days   PREDNISONE (STERAPRED UNI-PAK 21 TAB) 10 MG (21) TBPK TABLET    Take 6 pills on day one then decrease by 1 pill each day     Note:  This document was prepared using Dragon voice recognition software and may include unintentional dictation errors.    Versie Starks, PA-C 06/28/19 Rutha Bouchard, MD 06/29/19 1106

## 2019-06-28 NOTE — ED Notes (Signed)
Pt verbalized understanding of discharge instructions. NAD at this time. 

## 2019-06-28 NOTE — Discharge Instructions (Addendum)
Return to the emergency department if you are worsening.  Follow-up with medication management to obtain your prescriptions.  Take over-the-counter cough medication.  If your you are becoming more short of breath please return emergency department.  You should remain quarantined for 10 days from the date turned positive.

## 2019-07-05 ENCOUNTER — Other Ambulatory Visit: Payer: Self-pay

## 2019-07-07 ENCOUNTER — Ambulatory Visit: Payer: Self-pay | Attending: Internal Medicine

## 2019-07-07 DIAGNOSIS — R29719 NIHSS score 19: Secondary | ICD-10-CM

## 2019-07-07 DIAGNOSIS — Z20822 Contact with and (suspected) exposure to covid-19: Secondary | ICD-10-CM | POA: Insufficient documentation

## 2019-07-08 LAB — NOVEL CORONAVIRUS, NAA: SARS-CoV-2, NAA: NOT DETECTED

## 2019-12-24 ENCOUNTER — Emergency Department (HOSPITAL_COMMUNITY)
Admission: EM | Admit: 2019-12-24 | Discharge: 2019-12-24 | Disposition: A | Discharge status: Home / Self Care | Payer: Self-pay | Attending: Emergency Medicine | Admitting: Emergency Medicine

## 2019-12-24 ENCOUNTER — Emergency Department (HOSPITAL_COMMUNITY): Payer: Self-pay

## 2019-12-24 ENCOUNTER — Encounter (HOSPITAL_COMMUNITY): Payer: Self-pay

## 2019-12-24 ENCOUNTER — Other Ambulatory Visit: Payer: Self-pay

## 2019-12-24 DIAGNOSIS — N179 Acute kidney failure, unspecified: Secondary | ICD-10-CM | POA: Insufficient documentation

## 2019-12-24 DIAGNOSIS — J4531 Mild persistent asthma with (acute) exacerbation: Secondary | ICD-10-CM | POA: Insufficient documentation

## 2019-12-24 DIAGNOSIS — Z20822 Contact with and (suspected) exposure to covid-19: Secondary | ICD-10-CM | POA: Insufficient documentation

## 2019-12-24 DIAGNOSIS — R079 Chest pain, unspecified: Secondary | ICD-10-CM | POA: Insufficient documentation

## 2019-12-24 DIAGNOSIS — F17200 Nicotine dependence, unspecified, uncomplicated: Secondary | ICD-10-CM | POA: Insufficient documentation

## 2019-12-24 DIAGNOSIS — R0600 Dyspnea, unspecified: Secondary | ICD-10-CM | POA: Insufficient documentation

## 2019-12-24 DIAGNOSIS — E860 Dehydration: Secondary | ICD-10-CM | POA: Insufficient documentation

## 2019-12-24 DIAGNOSIS — E876 Hypokalemia: Secondary | ICD-10-CM | POA: Insufficient documentation

## 2019-12-24 DIAGNOSIS — Z79899 Other long term (current) drug therapy: Secondary | ICD-10-CM | POA: Insufficient documentation

## 2019-12-24 HISTORY — DX: Unspecified asthma, uncomplicated: J45.909

## 2019-12-24 LAB — TROPONIN I (HIGH SENSITIVITY)
Troponin I (High Sensitivity): 10 ng/L (ref <18)
Troponin I (High Sensitivity): 16 ng/L (ref <18)

## 2019-12-24 LAB — BASIC METABOLIC PANEL
Anion gap: 19 — ABNORMAL HIGH (ref 5–15)
BUN: 15 mg/dL (ref 6–20)
CO2: 17 mmol/L — ABNORMAL LOW (ref 22–32)
Calcium: 9.6 mg/dL (ref 8.9–10.3)
Chloride: 100 mmol/L (ref 98–111)
Creatinine, Ser: 1.35 mg/dL — ABNORMAL HIGH (ref 0.61–1.24)
GFR calc Af Amer: >60 mL/min (ref >60)
GFR calc non Af Amer: >60 mL/min (ref >60)
Glucose, Bld: 102 mg/dL — ABNORMAL HIGH (ref 70–99)
Potassium: 2.8 mmol/L — ABNORMAL LOW (ref 3.5–5.1)
Sodium: 136 mmol/L (ref 135–145)

## 2019-12-24 LAB — CBC
HCT: 42.6 % (ref 39.0–52.0)
Hemoglobin: 14.9 g/dL (ref 13.0–17.0)
MCH: 31.8 pg (ref 26.0–34.0)
MCHC: 35 g/dL (ref 30.0–36.0)
MCV: 90.8 fL (ref 80.0–100.0)
Platelets: 332 10*3/uL (ref 150–400)
RBC: 4.69 MIL/uL (ref 4.22–5.81)
RDW: 12.3 % (ref 11.5–15.5)
WBC: 12.1 10*3/uL — ABNORMAL HIGH (ref 4.0–10.5)
nRBC: 0 % (ref 0.0–0.2)

## 2019-12-24 LAB — D-DIMER, QUANTITATIVE: D-Dimer, Quant: <0.27 ug/mL-FEU (ref 0.00–0.50)

## 2019-12-24 LAB — SARS CORONAVIRUS 2 BY RT PCR (HOSPITAL ORDER, PERFORMED IN ~~LOC~~ HOSPITAL LAB): SARS Coronavirus 2: NEGATIVE

## 2019-12-24 MED ORDER — DEXAMETHASONE SODIUM PHOSPHATE 10 MG/ML IJ SOLN
10.0000 mg | Freq: Once | INTRAMUSCULAR | Status: AC
  Administered 2019-12-24: 10 mg via INTRAVENOUS
  Filled 2019-12-24: qty 1

## 2019-12-24 MED ORDER — SODIUM CHLORIDE 0.9 % IV BOLUS
1000.0000 mL | Freq: Once | INTRAVENOUS | Status: AC
  Administered 2019-12-24: 1000 mL via INTRAVENOUS

## 2019-12-24 MED ORDER — POTASSIUM CHLORIDE CRYS ER 20 MEQ PO TBCR
40.0000 meq | EXTENDED_RELEASE_TABLET | Freq: Once | ORAL | Status: AC
  Administered 2019-12-24: 40 meq via ORAL
  Filled 2019-12-24: qty 2

## 2019-12-24 MED ORDER — ALBUTEROL SULFATE HFA 108 (90 BASE) MCG/ACT IN AERS
2.0000 | INHALATION_SPRAY | Freq: Once | RESPIRATORY_TRACT | Status: AC
  Administered 2019-12-24: 2 via RESPIRATORY_TRACT
  Filled 2019-12-24: qty 6.7

## 2019-12-24 MED ORDER — ACETAMINOPHEN 500 MG PO TABS
1000.0000 mg | ORAL_TABLET | Freq: Once | ORAL | Status: AC
  Administered 2019-12-24: 1000 mg via ORAL
  Filled 2019-12-24: qty 2

## 2019-12-24 MED ORDER — FENTANYL CITRATE (PF) 100 MCG/2ML IJ SOLN
50.0000 ug | Freq: Once | INTRAMUSCULAR | Status: AC
  Administered 2019-12-24: 50 ug via INTRAVENOUS
  Filled 2019-12-24: qty 2

## 2019-12-24 MED ORDER — SODIUM CHLORIDE 0.9% FLUSH
3.0000 mL | Freq: Once | INTRAVENOUS | Status: AC
  Administered 2019-12-24: 3 mL via INTRAVENOUS

## 2019-12-24 NOTE — ED Triage Notes (Addendum)
Pt arrives to ED w/ c/o intermittent 5/10 centrally located chest pain and SOB since 1100. Pt denies pain at this time. Pt states she has hx of COPD and asthma, states that his inhalers have not been giving him any relief. Resp e/L in triage, SPO2 100% on RA.

## 2019-12-24 NOTE — ED Provider Notes (Signed)
[facility] EMERGENCY DEPARTMENT Provider Note   CSN: [id number] Arrival date & time: 12/24/19  1450     History Chief Complaint  Patient presents with  . Shortness of Breath    Kenneth Bautista is a 34 y.o. male.  Patient with history of COPD/asthma, uses albuterol inhaler as needed, cigarette smoker presents with intermittent sharp central chest pain nonradiating shortness of breath the last approximately 2 to 3 hours starting 11:00 today. Shortness of breath feels more significant than his typical asthma history. No cough or fever. No cardiac or blood clot history. No leg swelling. No recent surgeries or travel.        Past Medical History:  Diagnosis Date  . Arthritis   . Asthma     There are no problems to display for this patient.   History reviewed. No pertinent surgical history.     No family history on file.  Social History   Tobacco Use  . Smoking status: Current Every Day Smoker  . Smokeless tobacco: Never Used  Substance Use Topics  . Alcohol use: Yes  . Drug use: Not Currently    Types: Marijuana    Home Medications Prior to Admission medications   Medication Sig Start Date End Date Taking? Authorizing Provider  albuterol (VENTOLIN HFA) 108 (90 Base) MCG/ACT inhaler Inhale 2 puffs into the lungs every 6 (six) hours as needed for wheezing or shortness of breath. 06/28/19   Sherrie Mustache, Roselyn Bering, PA-C  azithromycin (ZITHROMAX Z-PAK) 250 MG tablet 2 pills today then 1 pill a day for 4 days 06/28/19   Sherrie Mustache Roselyn Bering, PA-C  predniSONE (STERAPRED UNI-PAK 21 TAB) 10 MG (21) TBPK tablet Take 6 pills on day one then decrease by 1 pill each day 06/28/19   Faythe Ghee, PA-C    Allergies    Patient has no known allergies.  Review of Systems   Review of Systems  Constitutional: Negative for chills and fever.  HENT: Negative for congestion.   Eyes: Negative for visual disturbance.  Respiratory: Positive for shortness of breath.     Cardiovascular: Positive for chest pain. Negative for leg swelling.  Gastrointestinal: Negative for abdominal pain and vomiting.  Genitourinary: Negative for dysuria and flank pain.  Musculoskeletal: Negative for back pain, neck pain and neck stiffness.  Skin: Negative for rash.  Neurological: Negative for light-headedness and headaches.    Physical Exam Updated Vital Signs BP 125/74   Pulse 74   Temp 98.3 F (36.8 C) (Oral)   Resp 16   SpO2 99%   Physical Exam Vitals and nursing note reviewed.  Constitutional:      Appearance: He is well-developed.  HENT:     Head: Normocephalic and atraumatic.  Eyes:     General:        Right eye: No discharge.        Left eye: No discharge.     Conjunctiva/sclera: Conjunctivae normal.  Neck:     Trachea: No tracheal deviation.  Cardiovascular:     Rate and Rhythm: Regular rhythm. Tachycardia present.  Pulmonary:     Effort: Pulmonary effort is normal.     Breath sounds: Decreased breath sounds (lower bilateral) present.  Abdominal:     General: There is no distension.     Palpations: Abdomen is soft.     Tenderness: There is no abdominal tenderness. There is no guarding.  Musculoskeletal:     Cervical back: Normal range of motion and neck supple.  Skin:    General: Skin is warm.     Findings: No rash.  Neurological:     Mental Status: He is alert and oriented to person, place, and time.     ED Results / Procedures / Treatments   Labs (all labs ordered are listed, but only abnormal results are displayed) Labs Reviewed  BASIC METABOLIC PANEL - Abnormal; Notable for the following components:      Result Value   Potassium 2.8 (*)    CO2 17 (*)    Glucose, Bld 102 (*)    Creatinine, Ser 1.35 (*)    Anion gap 19 (*)    All other components within normal limits  CBC - Abnormal; Notable for the following components:   WBC 12.1 (*)    All other components within normal limits  SARS CORONAVIRUS 2 BY RT PCR (HOSPITAL ORDER,  PERFORMED IN [facility])  D-DIMER, QUANTITATIVE (NOT AT [facility])  TROPONIN I (HIGH SENSITIVITY)  TROPONIN I (HIGH SENSITIVITY)    EKG EKG Interpretation  Date/Time:  Friday December 24 2019 15:03:11 EDT Ventricular Rate:  112 PR Interval:  112 QRS Duration: 100 QT Interval:  362 QTC Calculation: 494 R Axis:   104 Text Interpretation: Sinus tachycardia Rightward axis Borderline ECG Confirmed by Tilden Fossa [phone]) on 12/24/2019 4:30:49 PM   Radiology DG Chest 2 View  Result Date: 12/24/2019 CLINICAL DATA:  Shortness of breath, productive cough EXAM: CHEST - 2 VIEW COMPARISON:  Radiograph 06/28/2019 FINDINGS: No consolidation, features of edema, pneumothorax, or effusion. Pulmonary vascularity is normally distributed. The cardiomediastinal contours are unremarkable. No acute osseous or soft tissue abnormality. IMPRESSION: No acute cardiopulmonary abnormality. Electronically Signed   By: Kreg Shropshire M.D.   On: 12/24/2019 15:44    Procedures Procedures (including critical care time)  Medications Ordered in ED Medications  potassium chloride SA (KLOR-CON) CR tablet 40 mEq (has no administration in time range)  sodium chloride flush (NS) 0.9 % injection 3 mL (3 mLs Intravenous Given 12/24/19 2050)  potassium chloride SA (KLOR-CON) CR tablet 40 mEq (40 mEq Oral Given 12/24/19 1934)  acetaminophen (TYLENOL) tablet 1,000 mg (1,000 mg Oral Given 12/24/19 1934)  dexamethasone (DECADRON) injection 10 mg (10 mg Intravenous Given 12/24/19 1936)  albuterol (VENTOLIN HFA) 108 (90 Base) MCG/ACT inhaler 2 puff (2 puffs Inhalation Given 12/24/19 1936)  sodium chloride 0.9 % bolus 1,000 mL (1,000 mLs Intravenous New Bag/Given 12/24/19 2035)  fentaNYL (SUBLIMAZE) injection 50 mcg (50 mcg Intravenous Given 12/24/19 2050)    ED Course  I have reviewed the triage vital signs and the nursing notes.  Pertinent labs & imaging results that were available during my care of the patient were reviewed  by me and considered in my medical decision making (see chart for details).    MDM Rules/Calculators/A&P                          Patient presents with worsening shortness of breath and intermittent sharp chest pain. Aside from cigarette smoking patient is low risk from a cardiac standpoint, patient has no classic risk factors for blood clots however with tachycardia and shortness of breath D-dimer sent and pending. Initial troponin negative. EKG no acute ischemic findings. Blood work reviewed normal hemoglobin, slight elevation white blood cell count 12, with anion gap of 19. Potassium 2.8. Oral potassium, oral fluids and IV fluid bolus ordered. Patient not in respiratory distress, not requiring oxygen, mild tachycardia and tachypnea.  Covid test added. Albuterol and steroids given for possible asthma exacerbation. Chest x-ray no acute abnormalities. Tylenol given for pain.  On reassessment patient improving, heart rate in the 70s.  IV and oral fluids given.  Repeat oral potassium ordered.  Discussed importance of repeat lab work next week, staying well-hydrated.  D-dimer negative, repeat troponin negative.  Pain medicines given.  Patient stable for outpatient follow-up.  No indication for CT scan at this time.  Kenneth Bautista was evaluated in Emergency Department on 12/24/2019 for the symptoms described in the history of present illness. He was evaluated in the context of the global COVID-19 pandemic, which necessitated consideration that the patient might be at risk for infection with the SARS-CoV-2 virus that causes COVID-19. Institutional protocols and algorithms that pertain to the evaluation of patients at risk for COVID-19 are in a state of rapid change based on information released by regulatory bodies including the CDC and federal and state organizations. These policies and algorithms were followed during the patient's care in the ED.  Final Clinical Impression(s) / ED Diagnoses Final diagnoses:   Acute chest pain  Acute dyspnea  Dehydration  Acute renal failure, unspecified acute renal failure type (HCC)  Hypokalemia  Mild persistent asthma with acute exacerbation    Rx / DC Orders ED Discharge Orders    None       Blane Ohara, MD 12/24/19 2101

## 2019-12-24 NOTE — ED Notes (Signed)
Patient verbalizes understanding of discharge instructions. Opportunity for questioning and answers were provided. Armband removed by staff, pt discharged from ED ambulatory.   

## 2019-12-24 NOTE — Discharge Instructions (Addendum)
Have kidney function and potassium lab rechecked Monday or Tuesday by a primary doctor. Stay well hydrated with water. Avoid NSAIDS (ibuprofen, motrin type medicines)

## 2020-01-01 ENCOUNTER — Emergency Department (HOSPITAL_COMMUNITY)
Admission: EM | Admit: 2020-01-01 | Discharge: 2020-01-02 | Disposition: A | Discharge status: Home / Self Care | Payer: Self-pay | Attending: Emergency Medicine | Admitting: Emergency Medicine

## 2020-01-01 ENCOUNTER — Emergency Department (HOSPITAL_COMMUNITY): Payer: Self-pay

## 2020-01-01 DIAGNOSIS — R300 Dysuria: Secondary | ICD-10-CM | POA: Insufficient documentation

## 2020-01-01 DIAGNOSIS — R42 Dizziness and giddiness: Secondary | ICD-10-CM | POA: Insufficient documentation

## 2020-01-01 DIAGNOSIS — Z5321 Procedure and treatment not carried out due to patient leaving prior to being seen by health care provider: Secondary | ICD-10-CM | POA: Insufficient documentation

## 2020-01-01 DIAGNOSIS — R0602 Shortness of breath: Secondary | ICD-10-CM | POA: Insufficient documentation

## 2020-01-01 DIAGNOSIS — R0789 Other chest pain: Secondary | ICD-10-CM | POA: Insufficient documentation

## 2020-01-01 LAB — BASIC METABOLIC PANEL
Anion gap: 11 (ref 5–15)
BUN: 12 mg/dL (ref 6–20)
CO2: 22 mmol/L (ref 22–32)
Calcium: 9.4 mg/dL (ref 8.9–10.3)
Chloride: 104 mmol/L (ref 98–111)
Creatinine, Ser: 1.2 mg/dL (ref 0.61–1.24)
GFR calc Af Amer: >60 mL/min (ref >60)
GFR calc non Af Amer: >60 mL/min (ref >60)
Glucose, Bld: 93 mg/dL (ref 70–99)
Potassium: 3.3 mmol/L — ABNORMAL LOW (ref 3.5–5.1)
Sodium: 137 mmol/L (ref 135–145)

## 2020-01-01 LAB — TROPONIN I (HIGH SENSITIVITY)
Troponin I (High Sensitivity): 3 ng/L (ref <18)
Troponin I (High Sensitivity): 4 ng/L (ref <18)

## 2020-01-01 LAB — CBC
HCT: 39 % (ref 39.0–52.0)
Hemoglobin: 13.5 g/dL (ref 13.0–17.0)
MCH: 31.5 pg (ref 26.0–34.0)
MCHC: 34.6 g/dL (ref 30.0–36.0)
MCV: 90.9 fL (ref 80.0–100.0)
Platelets: 330 10*3/uL (ref 150–400)
RBC: 4.29 MIL/uL (ref 4.22–5.81)
RDW: 12.4 % (ref 11.5–15.5)
WBC: 5.5 10*3/uL (ref 4.0–10.5)
nRBC: 0 % (ref 0.0–0.2)

## 2020-01-01 MED ORDER — SODIUM CHLORIDE 0.9% FLUSH: 3.0000 mL | Freq: Once | INTRAVENOUS | Status: DC

## 2020-01-01 NOTE — ED Notes (Addendum)
Pt was told the risk of leaving. Pt was encouraged to stay. Pt  Said they can no longer wait

## 2020-01-01 NOTE — ED Notes (Signed)
Pt step outside 

## 2020-01-01 NOTE — ED Triage Notes (Signed)
Pt returns persistent chest pain, shortness of breath.  Onset several days dizziness and dysuria.  Denies hematuria, penile discharge.  Sexually active, one partner, does not use condoms.

## 2020-01-26 NOTE — Patient Instructions (Signed)
Thank you for choosing Primary Care at [facility] to be your medical home!    Kenneth Bautista was seen by De Hollingshead, DO today.   Kenneth Bautista's primary care provider is Marcy Siren, DO.   For the best care possible, you should try to see Marcy Siren, DO whenever you come to the clinic.   We look forward to seeing you again soon!  If you have any questions about your visit today, please call us at [phone] or feel free to reach your primary care provider via MyChart.

## 2020-01-27 ENCOUNTER — Ambulatory Visit (INDEPENDENT_AMBULATORY_CARE_PROVIDER_SITE_OTHER): Payer: 59 | Admitting: Internal Medicine

## 2020-01-27 ENCOUNTER — Encounter: Payer: Self-pay | Admitting: Internal Medicine

## 2020-01-27 DIAGNOSIS — J4541 Moderate persistent asthma with (acute) exacerbation: Secondary | ICD-10-CM

## 2020-01-27 DIAGNOSIS — Z7689 Persons encountering health services in other specified circumstances: Secondary | ICD-10-CM

## 2020-01-27 DIAGNOSIS — N179 Acute kidney failure, unspecified: Secondary | ICD-10-CM

## 2020-01-27 DIAGNOSIS — E876 Hypokalemia: Secondary | ICD-10-CM

## 2020-01-27 DIAGNOSIS — Z09 Encounter for follow-up examination after completed treatment for conditions other than malignant neoplasm: Secondary | ICD-10-CM | POA: Diagnosis not present

## 2020-01-27 DIAGNOSIS — M940 Chondrocostal junction syndrome [Tietze]: Secondary | ICD-10-CM

## 2020-01-27 MED ORDER — ALBUTEROL SULFATE HFA 108 (90 BASE) MCG/ACT IN AERS: 2.0000 | INHALATION_SPRAY | Freq: Four times a day (QID) | RESPIRATORY_TRACT | 0 refills | Status: DC | PRN

## 2020-01-27 MED ORDER — FLOVENT HFA 44 MCG/ACT IN AERO: 2.0000 | INHALATION_SPRAY | Freq: Two times a day (BID) | RESPIRATORY_TRACT | 12 refills | Status: DC

## 2020-01-27 MED ORDER — MELOXICAM 15 MG PO TABS: 15.0000 mg | ORAL_TABLET | Freq: Every day | ORAL | 0 refills | Status: DC

## 2020-01-27 NOTE — Progress Notes (Signed)
Virtual Visit via Telephone Note  I connected with Kenneth Bautista, on 01/27/2020 at 10:32 AM by telephone due to the COVID-19 pandemic and verified that I am speaking with the correct person using two identifiers.   Consent: I discussed the limitations, risks, security and privacy concerns of performing an evaluation and management service by telephone and the availability of in person appointments. I also discussed with the patient that there may be a patient responsible charge related to this service. The patient expressed understanding and agreed to proceed.   Location of Patient: Home   Location of Provider: Clinic    Persons participating in Telemedicine visit: Nain Rudd [facility] Dr. Earlene Plater      History of Present Illness: Patient has a visit to establish care. PMH significant for asthma. Has not seen a PCP for several years.     Was seen in ED on 7/23 for asthma exacerbation. Reports he is still having some SOB and chest tightness. Feels like muscle discomfort "around his heart". Was told at the hospital that his discomfort is related to muscle pain. Bending over and lifting boxes worsens his pain. He is using Albuterol at least twice per day. Has nighttime awakenings about 2, maybe 3, times per week with SOB. Thinks he might have been on a controller inhaler at some point after a hospital discharge for asthma but doesn't remember the name and the Rx ran out since he wasn't established with a PCP. Has been admitted for his asthma. Has not been on vent.   ED Provider A/P:   Patient presents with worsening shortness of breath and intermittent sharp chest pain. Aside from cigarette smoking patient is low risk from a cardiac standpoint, patient has no classic risk factors for blood clots however with tachycardia and shortness of breath D-dimer sent and pending. Initial troponin negative. EKG no acute ischemic findings. Blood work reviewed normal hemoglobin, slight  elevation white blood cell count 12, with anion gap of 19. Potassium 2.8. Oral potassium, oral fluids and IV fluid bolus ordered. Patient not in respiratory distress, not requiring oxygen, mild tachycardia and tachypnea. Covid test added. Albuterol and steroids given for possible asthma exacerbation. Chest x-ray no acute abnormalities. Tylenol given for pain.  On reassessment patient improving, heart rate in the 70s.  IV and oral fluids given.  Repeat oral potassium ordered.  Discussed importance of repeat lab work next week, staying well-hydrated.  D-dimer negative, repeat troponin negative.  Pain medicines given.  Patient stable for outpatient follow-up.  No indication for CT scan at this time   Past Medical History:  Diagnosis Date  . Arthritis   . Asthma    No Known Allergies  Current Outpatient Medications on File Prior to Visit  Medication Sig Dispense Refill  . albuterol (VENTOLIN HFA) 108 (90 Base) MCG/ACT inhaler Inhale 2 puffs into the lungs every 6 (six) hours as needed for wheezing or shortness of breath. 16 g 0   No current facility-administered medications on file prior to visit.    Observations/Objective: NAD. Speaking clearly.  Work of breathing normal.  Alert and oriented. Mood appropriate.   Assessment and Plan: 1. Encounter to establish care Reviewed patient's PMH, social history, surgical history, and medications.  Is overdue for annual exam, screening blood work, and health maintenance topics. Have asked patient to return for visit to address these items.   2. Hospital discharge follow-up  3. Moderate persistent asthma  Does not seem to be having an overt exacerbation  but suspect that he is suffering from uncontrolled persistent asthma. Will add in inhaled corticosteroid to his regimen. Discussed that he should use this BID regardless of symptoms in an effort to reduce his use of rescue inhaler and prevent further exacerbations. Smoking reduction/quitting would  benefit his asthma significantly.  - albuterol (VENTOLIN HFA) 108 (90 Base) MCG/ACT inhaler; Inhale 2 puffs into the lungs every 6 (six) hours as needed for wheezing or shortness of breath.  Dispense: 16 g; Refill: 0 - fluticasone (FLOVENT HFA) 44 MCG/ACT inhaler; Inhale 2 puffs into the lungs in the morning and at bedtime.  Dispense: 1 each; Refill: 12  4. Acute renal failure, unspecified acute renal failure type (HCC) Cr was initially 1.35 but improved to 1.2 after IVFs in hospital. Monitor.  - Basic Metabolic Panel  5. Hypokalemia K 2.8>3.3 after repletion. Monitor.  - Basic Metabolic Panel  6. Costochondritis Symptoms sound consistent with costochondritis from persistent asthma. Treat with anti-inflammatory. Reviewed that had normal troponin and EKG in hospital. Negative D-dimer.   - meloxicam (MOBIC) 15 MG tablet; Take 1 tablet (15 mg total) by mouth daily.  Dispense: 30 tablet; Refill: 0   Follow Up Instructions: Lab visit 8.27; Annual exam 9/20    I discussed the assessment and treatment plan with the patient. The patient was provided an opportunity to ask questions and all were answered. The patient agreed with the plan and demonstrated an understanding of the instructions.   The patient was advised to call back or seek an in-person evaluation if the symptoms worsen or if the condition fails to improve as anticipated.     I provided 22 minutes total of non-face-to-face time during this encounter including median intraservice time, reviewing previous notes, investigations, ordering medications, medical decision making, coordinating care and patient verbalized understanding at the end of the visit.    Marcy Siren, D.O. Primary Care at [facility]  01/27/2020, 10:32 AM

## 2020-01-28 ENCOUNTER — Other Ambulatory Visit (INDEPENDENT_AMBULATORY_CARE_PROVIDER_SITE_OTHER): Payer: 59

## 2020-01-28 ENCOUNTER — Other Ambulatory Visit: Payer: Self-pay

## 2020-01-28 DIAGNOSIS — N179 Acute kidney failure, unspecified: Secondary | ICD-10-CM

## 2020-01-28 DIAGNOSIS — E876 Hypokalemia: Secondary | ICD-10-CM | POA: Diagnosis not present

## 2020-01-29 LAB — BASIC METABOLIC PANEL
BUN/Creatinine Ratio: 12 (ref 9–20)
BUN: 12 mg/dL (ref 6–20)
CO2: 25 mmol/L (ref 20–29)
Calcium: 9.5 mg/dL (ref 8.7–10.2)
Chloride: 102 mmol/L (ref 96–106)
Creatinine, Ser: 0.98 mg/dL (ref 0.76–1.27)
GFR calc Af Amer: 115 mL/min/{1.73_m2} (ref >59)
GFR calc non Af Amer: 99 mL/min/{1.73_m2} (ref >59)
Glucose: 73 mg/dL (ref 65–99)
Potassium: 4.4 mmol/L (ref 3.5–5.2)
Sodium: 138 mmol/L (ref 134–144)

## 2020-02-18 NOTE — Patient Instructions (Addendum)
Labs today: hep C and HIV  Stop flovent  You declined your flu vaccine  Please get a COVID vaccine, see below:  COVID-19 Vaccine Information can be found at: PodExchange.nl For questions related to vaccine distribution or appointments, please email [email or web address] or call [phone].   Stop smoking   Obtain a dental exam, you will need your teeth removed   Return Dr Earlene Plater in 4 months face to face   Smoking Tobacco Information, Adult Smoking tobacco can be harmful to your health. Tobacco contains a poisonous (toxic), colorless chemical called nicotine. Nicotine is addictive. It changes the brain and can make it hard to stop smoking. Tobacco also has other toxic chemicals that can hurt your body and raise your risk of many cancers. How can smoking tobacco affect me? Smoking tobacco puts you at risk for:  Cancer. Smoking is most commonly associated with lung cancer, but can also lead to cancer in other parts of the body.  Chronic obstructive pulmonary disease (COPD). This is a long-term lung condition that makes it hard to breathe. It also gets worse over time.  High blood pressure (hypertension), heart disease, stroke, or heart attack.  Lung infections, such as pneumonia.  Cataracts. This is when the lenses in the eyes become clouded.  Digestive problems. This may include peptic ulcers, heartburn, and gastroesophageal reflux disease (GERD).  Oral health problems, such as gum disease and tooth loss.  Loss of taste and smell. Smoking can affect your appearance by causing:  Wrinkles.  Yellow or stained teeth, fingers, and fingernails. Smoking tobacco can also affect your social life, because:  It may be challenging to find places to smoke when away from home. Many workplaces, [employer], hotels, and public places are tobacco-free.  Smoking is expensive. This is due to the cost of tobacco and the  long-term costs of treating health problems from smoking.  Secondhand smoke may affect those around you. Secondhand smoke can cause lung cancer, breathing problems, and heart disease. Children of smokers have a higher risk for: ? Sudden infant death syndrome (SIDS). ? Ear infections. ? Lung infections. If you currently smoke tobacco, quitting now can help you:  Lead a longer and healthier life.  Look, smell, breathe, and feel better over time.  Save money.  Protect others from the harms of secondhand smoke. What actions can I take to prevent health problems? Quit smoking   Do not start smoking. Quit if you already do.  Make a plan to quit smoking and commit to it. Look for programs to help you and ask your health care provider for recommendations and ideas.  Set a date and write down all the reasons you want to quit.  Let your friends and family know you are quitting so they can help and support you. Consider finding friends who also want to quit. It can be easier to quit with someone else, so that you can support each other.  Talk with your health care provider about using nicotine replacement medicines to help you quit, such as gum, lozenges, patches, sprays, or pills.  Do not replace cigarette smoking with electronic cigarettes, which are commonly called e-cigarettes. The safety of e-cigarettes is not known, and some may contain harmful chemicals.  If you try to quit but return to smoking, stay positive. It is common to slip up when you first quit, so take it one day at a time.  Be prepared for cravings. When you feel the urge to smoke, chew gum or suck on hard  candy. Lifestyle  Stay busy and take care of your body.  Drink enough fluid to keep your urine pale yellow.  Get plenty of exercise and eat a healthy diet. This can help prevent weight gain after quitting.  Monitor your eating habits. Quitting smoking can cause you to have a larger appetite than when you  smoke.  Find ways to relax. Go out with friends or family to a movie or a restaurant where people do not smoke.  Ask your health care provider about having regular tests (screenings) to check for cancer. This may include blood tests, imaging tests, and other tests.  Find ways to manage your stress, such as meditation, yoga, or exercise. Where to find support To get support to quit smoking, consider:  Asking your health care provider for more information and resources.  Taking classes to learn more about quitting smoking.  Looking for local organizations that offer resources about quitting smoking.  Joining a support group for people who want to quit smoking in your local community.  Calling the smokefree.gov counselor helpline: 1-800-Quit-Now (551)670-8569) Where to find more information You may find more information about quitting smoking from:  HelpGuide.org: www.helpguide.org  BankRights.uy: smokefree.gov  American Lung Association: www.lung.org Contact a health care provider if you:  Have problems breathing.  Notice that your lips, nose, or fingers turn blue.  Have chest pain.  Are coughing up blood.  Feel faint or you pass out.  Have other health changes that cause you to worry. Summary  Smoking tobacco can negatively affect your health, the health of those around you, your finances, and your social life.  Do not start smoking. Quit if you already do. If you need help quitting, ask your health care provider.  Think about joining a support group for people who want to quit smoking in your local community. There are many effective programs that will help you to quit this behavior. This information is not intended to replace advice given to you by your health care provider. Make sure you discuss any questions you have with your health care provider. Document Revised: 02/12/2019 Document Reviewed: 06/04/2016 Elsevier Patient Education  2020 [employer].    Keeping you healthy  Get these tests  Blood pressure- Have your blood pressure checked once a year by your healthcare provider.  Normal blood pressure is 120/80.  Weight- Have your body mass index (BMI) calculated to screen for obesity.  BMI is a measure of body fat based on height and weight. You can also calculate your own BMI at https://www.west-esparza.com/.  Cholesterol- Have your cholesterol checked regularly starting at age 12, sooner may be necessary if you have diabetes, high blood pressure, if a family member developed heart diseases at an early age or if you smoke.   Chlamydia, HIV, and other sexual transmitted disease- Get screened each year until the age of 69 then within three months of each new sexual partner.  Diabetes- Have your blood sugar checked regularly if you have high blood pressure, high cholesterol, a family history of diabetes or if you are overweight.  Get these vaccines  Flu shot- Every fall.  Tetanus shot- Every 10 years.  Menactra- Single dose; prevents meningitis.  Take these steps  Don't smoke- If you do smoke, ask your healthcare provider about quitting. For tips on how to quit, go to www.smokefree.gov or call 1-800-QUIT-NOW.  Be physically active- Exercise 5 days a week for at least 30 minutes.  If you are not already physically active  start slow and gradually work up to 30 minutes of moderate physical activity.  Examples of moderate activity include walking briskly, mowing the yard, dancing, swimming bicycling, etc.  Eat a healthy diet- Eat a variety of healthy foods such as fruits, vegetables, low fat milk, low fat cheese, yogurt, lean meats, poultry, fish, beans, tofu, etc.  For more information on healthy eating, go to www.thenutritionsource.org  Drink alcohol in moderation- Limit alcohol intake two drinks or less a day.  Never drink and drive.  Dentist- Brush and floss teeth twice daily; visit your dentis twice a year.  Depression-Your  emotional health is as important as your physical health.  If you're feeling down, losing interest in things you normally enjoy please talk with your healthcare provider.  Gun Safety- If you keep a gun in your home, keep it unloaded and with the safety lock on.  Bullets should be stored separately.  Helmet use- Always wear a helmet when riding a motorcycle, bicycle, rollerblading or skateboarding.  Safe sex- If you may be exposed to a sexually transmitted infection, use a condom  Seat belts- Seat bels can save your life; always wear one.  Smoke/Carbon Monoxide detectors- These detectors need to be installed on the appropriate level of your home.  Replace batteries at least once a year.  Skin Cancer- When out in the sun, cover up and use sunscreen SPF 15 or higher.  Violence- If anyone is threatening or hurting you, please tell your healthcare provider.

## 2020-02-20 NOTE — Progress Notes (Signed)
Subjective:    Patient ID: Kenneth Bautista, male    DOB: [birth year], 34 y.o.   MRN: [id number]  This a 34 year old male seen today for evaluation to establish primary care.  Note the patient has suffered homelessness and was homeless for 4 months this year but just 2 weeks ago obtain housing.  He smokes 1 pack a day of cigarettes.  The patient had a visit in August that was a phone visit to establish care.  At that time he was given a prescription for Flovent inhaler for asthma however he could not fill this because he could not afford the medications.  He does have the albuterol he uses this as needed.  He was trying to cut back on smoking.  He also had been in the hospital previously with volume depletion and dehydration creatinine 1.35 however follow-up metabolic panel recently returned normal with a creatinine and potassium was normal as well.  He also has history of costochondritis exacerbated by coughing and dehydration.  He is on meloxicam which is not improved the symptoms as much.  Patient denies any Covid exposure.  On arrival blood pressure is 128/74.   Past Medical History:  Diagnosis Date  . Arthritis   . Asthma      History reviewed. No pertinent family history.   Social History   Socioeconomic History  . Marital status: Single    Spouse name: Not on file  . Number of children: Not on file  . Years of education: Not on file  . Highest education level: Not on file  Occupational History  . Not on file  Tobacco Use  . Smoking status: Current Every Day Smoker  . Smokeless tobacco: Never Used  Substance and Sexual Activity  . Alcohol use: Yes  . Drug use: Not Currently    Types: Marijuana  . Sexual activity: Not on file  Other Topics Concern  . Not on file  Social History Narrative  . Not on file   Social Determinants of Health   Financial Resource Strain:   . Difficulty of Paying Living Expenses: Not on file  Food Insecurity:   . Worried About Brewing technologist in the Last Year: Not on file  . Ran Out of Food in the Last Year: Not on file  Transportation Needs:   . Lack of Transportation (Medical): Not on file  . Lack of Transportation (Non-Medical): Not on file  Physical Activity:   . Days of Exercise per Week: Not on file  . Minutes of Exercise per Session: Not on file  Stress:   . Feeling of Stress : Not on file  Social Connections:   . Frequency of Communication with Friends and Family: Not on file  . Frequency of Social Gatherings with Friends and Family: Not on file  . Attends Religious Services: Not on file  . Active Member of Clubs or Organizations: Not on file  . Attends Banker Meetings: Not on file  . Marital Status: Not on file  Intimate Partner Violence:   . Fear of Current or Ex-Partner: Not on file  . Emotionally Abused: Not on file  . Physically Abused: Not on file  . Sexually Abused: Not on file     No Known Allergies   Outpatient Medications Prior to Visit  Medication Sig Dispense Refill  . albuterol (VENTOLIN HFA) 108 (90 Base) MCG/ACT inhaler Inhale 2 puffs into the lungs every 6 (six) hours as needed for wheezing or shortness  of breath. 16 g 0  . meloxicam (MOBIC) 15 MG tablet Take 1 tablet (15 mg total) by mouth daily. 30 tablet 0  . fluticasone (FLOVENT HFA) 44 MCG/ACT inhaler Inhale 2 puffs into the lungs in the morning and at bedtime. (Patient not taking: Reported on 02/21/2020) 1 each 12   No facility-administered medications prior to visit.     Review of Systems  Constitutional: Negative.   HENT: Positive for dental problem.   Eyes: Negative.   Respiratory: Positive for cough. Negative for shortness of breath and wheezing.   Cardiovascular: Positive for chest pain.  Gastrointestinal: Negative.   Endocrine: Negative.   Musculoskeletal: Negative.   Allergic/Immunologic: Negative.   Neurological: Negative.   Hematological: Negative.   Psychiatric/Behavioral: Negative.         Objective:   Physical Exam Vitals:   02/21/20 0857  BP: 128/74  Pulse: (!) 51  Resp: 17  Temp: (!) 97.3 F (36.3 C)  TempSrc: Temporal  SpO2: 98%  Weight: 136 lb (61.7 kg)  Height: 5\' 4"  (1.626 m)    Gen: Pleasant, well-nourished, in no distress,  normal affect  ENT: No lesions,  mouth clear,  oropharynx clear, no postnasal drip, multiple carious teeth many of whom are worn down  Neck: No JVD, no TMG, no carotid bruits  Lungs: No use of accessory muscles, no dullness to percussion, clear without rales or rhonchi Tender anterior chest wall along the left sternal border compatible with costochondritis  Cardiovascular: RRR, heart sounds normal, no murmur or gallops, no peripheral edema  Abdomen: soft and NT, no HSM,  BS normal  Musculoskeletal: No deformities, no cyanosis or clubbing  Neuro: alert, non focal  Skin: Warm, no lesions or rashes  No results found.       Assessment & Plan:  I personally reviewed all images and lab data in the [facility] system as well as any outside material available during this office visit and agree with the  radiology impressions.   Asthma, mild intermittent Mild intermittent asthma stable at this time continue albuterol alone discontinue Flovent pursue smoking cessation  Dental caries Severe dental caries with significant erosion of both teeth upper and lower  Referral to dentistry given  Tobacco use disorder    . Current smoking consumption amount: 1 pack a day  . Dicsussion on advise to quit smoking and smoking impacts: Effects on lung oral and cardiovascular health  . Patient's willingness to quit: Willing to quit  . Methods to quit smoking discussed: Use of nicotine lozenge behavioral modification  . Medication management of smoking session drugs discussed: Use 4 mg nicotine lozenges 3-4 times daily  . Resources provided:  AVS   . Setting quit date 2 months  . Follow-up arranged 4 months   Time spent counseling the  patient:     Costochondritis Costochondritis anterior chest wall will give Voltaren gel for this   Kenneth Bautista was seen today for annual exam and asthma.  Diagnoses and all orders for this visit:  Annual physical exam -     Cancel: CBC with Differential -     Cancel: Comprehensive metabolic panel -     Cancel: Lipid Panel -     CBC with Differential; Future -     Comprehensive metabolic panel; Future -     Lipid Panel; Future  Need for hepatitis C screening test -     Cancel: HCV Ab w/Rflx to Verification -     HCV Ab w/Rflx to Verification; Future  Moderate persistent asthma with exacerbation  Encounter for screening for HIV -     Cancel: HIV antibody (with reflex) -     HIV antibody (with reflex); Future  Tobacco use disorder  Mild intermittent asthma without complication  Dental caries  Costochondritis  Other orders -     diclofenac Sodium (VOLTAREN) 1 % GEL; Apply 2 g topically 4 (four) times daily.     Will check HIV level and hepatitis C assays  I spent 10 minutes discussing the need for Covid vaccination I believe the patient will give this consideration.  Initially when he came in he said it had been studied too quickly.  I addressed all of his concerns and used shared decision making.  The combination of this and the smoking cessation took 15 minutes of this office visit

## 2020-02-21 ENCOUNTER — Ambulatory Visit (INDEPENDENT_AMBULATORY_CARE_PROVIDER_SITE_OTHER): Payer: 59 | Admitting: Critical Care Medicine

## 2020-02-21 ENCOUNTER — Other Ambulatory Visit: Payer: Self-pay

## 2020-02-21 VITALS — BP 128/74 | HR 51 | Temp 97.3°F | Resp 17 | Ht 64.0 in | Wt 136.0 lb

## 2020-02-21 DIAGNOSIS — J4541 Moderate persistent asthma with (acute) exacerbation: Secondary | ICD-10-CM

## 2020-02-21 DIAGNOSIS — F17200 Nicotine dependence, unspecified, uncomplicated: Secondary | ICD-10-CM

## 2020-02-21 DIAGNOSIS — K029 Dental caries, unspecified: Secondary | ICD-10-CM | POA: Insufficient documentation

## 2020-02-21 DIAGNOSIS — Z720 Tobacco use: Secondary | ICD-10-CM | POA: Insufficient documentation

## 2020-02-21 DIAGNOSIS — J4520 Mild intermittent asthma, uncomplicated: Secondary | ICD-10-CM | POA: Insufficient documentation

## 2020-02-21 DIAGNOSIS — Z114 Encounter for screening for human immunodeficiency virus [HIV]: Secondary | ICD-10-CM | POA: Diagnosis not present

## 2020-02-21 DIAGNOSIS — M940 Chondrocostal junction syndrome [Tietze]: Secondary | ICD-10-CM | POA: Insufficient documentation

## 2020-02-21 DIAGNOSIS — Z0000 Encounter for general adult medical examination without abnormal findings: Secondary | ICD-10-CM | POA: Diagnosis not present

## 2020-02-21 DIAGNOSIS — Z1159 Encounter for screening for other viral diseases: Secondary | ICD-10-CM | POA: Diagnosis not present

## 2020-02-21 MED ORDER — DICLOFENAC SODIUM 1 % EX GEL: 2.0000 g | Freq: Four times a day (QID) | CUTANEOUS | 0 refills | Status: DC

## 2020-02-21 NOTE — Assessment & Plan Note (Signed)
Mild intermittent asthma stable at this time continue albuterol alone discontinue Flovent pursue smoking cessation

## 2020-02-21 NOTE — Assessment & Plan Note (Signed)
Costochondritis anterior chest wall will give Voltaren gel for this

## 2020-02-21 NOTE — Assessment & Plan Note (Signed)
  .   Current smoking consumption amount: 1 pack a day  . Dicsussion on advise to quit smoking and smoking impacts: Effects on lung oral and cardiovascular health  . Patient's willingness to quit: Willing to quit  . Methods to quit smoking discussed: Use of nicotine lozenge behavioral modification  . Medication management of smoking session drugs discussed: Use 4 mg nicotine lozenges 3-4 times daily  . Resources provided:  AVS   . Setting quit date 2 months  . Follow-up arranged 4 months   Time spent counseling the patient:

## 2020-02-21 NOTE — Assessment & Plan Note (Signed)
Severe dental caries with significant erosion of both teeth upper and lower  Referral to dentistry given

## 2020-04-03 ENCOUNTER — Emergency Department (HOSPITAL_COMMUNITY)
Admission: EM | Admit: 2020-04-03 | Discharge: 2020-04-03 | Disposition: A | Discharge status: Home / Self Care | Payer: 59 | Attending: Emergency Medicine | Admitting: Emergency Medicine

## 2020-04-03 ENCOUNTER — Emergency Department (HOSPITAL_COMMUNITY): Payer: 59

## 2020-04-03 ENCOUNTER — Encounter (HOSPITAL_COMMUNITY): Payer: Self-pay | Admitting: *Deleted

## 2020-04-03 ENCOUNTER — Other Ambulatory Visit: Payer: Self-pay

## 2020-04-03 DIAGNOSIS — J4520 Mild intermittent asthma, uncomplicated: Secondary | ICD-10-CM | POA: Diagnosis not present

## 2020-04-03 DIAGNOSIS — F17200 Nicotine dependence, unspecified, uncomplicated: Secondary | ICD-10-CM | POA: Diagnosis not present

## 2020-04-03 DIAGNOSIS — R0789 Other chest pain: Secondary | ICD-10-CM | POA: Insufficient documentation

## 2020-04-03 LAB — CBC
HCT: 42.8 % (ref 39.0–52.0)
Hemoglobin: 14.7 g/dL (ref 13.0–17.0)
MCH: 31.8 pg (ref 26.0–34.0)
MCHC: 34.3 g/dL (ref 30.0–36.0)
MCV: 92.6 fL (ref 80.0–100.0)
Platelets: 265 10*3/uL (ref 150–400)
RBC: 4.62 MIL/uL (ref 4.22–5.81)
RDW: 12.7 % (ref 11.5–15.5)
WBC: 6 10*3/uL (ref 4.0–10.5)
nRBC: 0 % (ref 0.0–0.2)

## 2020-04-03 LAB — TROPONIN I (HIGH SENSITIVITY): Troponin I (High Sensitivity): <2 ng/L (ref <18)

## 2020-04-03 LAB — BASIC METABOLIC PANEL
Anion gap: 9 (ref 5–15)
BUN: 13 mg/dL (ref 6–20)
CO2: 24 mmol/L (ref 22–32)
Calcium: 9.2 mg/dL (ref 8.9–10.3)
Chloride: 103 mmol/L (ref 98–111)
Creatinine, Ser: 0.88 mg/dL (ref 0.61–1.24)
GFR, Estimated: >60 mL/min (ref >60)
Glucose, Bld: 114 mg/dL — ABNORMAL HIGH (ref 70–99)
Potassium: 3.3 mmol/L — ABNORMAL LOW (ref 3.5–5.1)
Sodium: 136 mmol/L (ref 135–145)

## 2020-04-03 MED ORDER — DICLOFENAC SODIUM 1 % EX GEL
2.0000 g | Freq: Four times a day (QID) | CUTANEOUS | 0 refills | Status: DC
Start: 2020-04-03 — End: 2020-08-17

## 2020-04-03 NOTE — ED Provider Notes (Signed)
[facility]-EMERGENCY DEPT Provider Note   CSN: [id number] Arrival date & time: 04/03/20  6644     History Chief Complaint  Patient presents with  . Chest Pain    Kenneth Bautista is a 34 y.o. male.  HPI  HPI: A 34 year old patient presents for evaluation of chest pain. Initial onset of pain was more than 6 hours ago. The patient's chest pain is sharp and is not worse with exertion. The patient's chest pain is middle- or left-sided, is not well-localized, is not described as heaviness/pressure/tightness and does not radiate to the arms/jaw/neck. The patient does not complain of nausea and denies diaphoresis. The patient has smoked in the past 90 days. The patient has no history of stroke, has no history of peripheral artery disease, denies any history of treated diabetes, has no relevant family history of coronary artery disease (first degree relative at less than age 38), is not hypertensive, has no history of hypercholesterolemia and does not have an elevated BMI (>=30). Patient presents to the ED for evaluation of chest pain.  Patient states he has been having the symptoms off and on for the last couple of months.  Patient states he has a sharp pain in the anterior aspect of his chest that increases with palpation and movement.  Patient states he was previously seen in the emergency room and was started on ibuprofen.  Patient was not given any refills.  Medical records indicate the patient saw a doctor at [facility] for his asthma.  There is a comment about him having costochondritis and getting Voltaren gel.  Patient states he has not received that medication.  Patient denies any shortness of breath.  No fevers or chills.  He had he was at work today when he had the pain and had to come to the ED.  Past Medical History:  Diagnosis Date  . Arthritis   . Asthma     Patient Active Problem List   Diagnosis Date Noted  . Tobacco use disorder 02/21/2020  .  Asthma, mild intermittent 02/21/2020  . Dental caries 02/21/2020  . Costochondritis 02/21/2020    History reviewed. No pertinent surgical history.     No family history on file.  Social History   Tobacco Use  . Smoking status: Current Every Day Smoker  . Smokeless tobacco: Never Used  Substance Use Topics  . Alcohol use: Yes  . Drug use: Not Currently    Types: Marijuana    Home Medications Prior to Admission medications   Medication Sig Start Date End Date Taking? Authorizing Provider  albuterol (VENTOLIN HFA) 108 (90 Base) MCG/ACT inhaler Inhale 2 puffs into the lungs every 6 (six) hours as needed for wheezing or shortness of breath. 01/27/20   Arvilla Market, DO  diclofenac Sodium (VOLTAREN) 1 % GEL Apply 2 g topically 4 (four) times daily. 04/03/20   Linwood Dibbles, MD    Allergies    Patient has no known allergies.  Review of Systems   Review of Systems  All other systems reviewed and are negative.   Physical Exam Updated Vital Signs BP (!) 120/52   Pulse 61   Temp 98.2 F (36.8 C) (Oral)   Resp 15   Ht 1.676 m (5\' 6" )   Wt 61.2 kg   SpO2 100%   BMI 21.79 kg/m   Physical Exam Vitals and nursing note reviewed.  Constitutional:      General: He is not in acute distress.  Appearance: He is well-developed.  HENT:     Head: Normocephalic and atraumatic.     Right Ear: External ear normal.     Left Ear: External ear normal.  Eyes:     General: No scleral icterus.       Right eye: No discharge.        Left eye: No discharge.     Conjunctiva/sclera: Conjunctivae normal.  Neck:     Trachea: No tracheal deviation.  Cardiovascular:     Rate and Rhythm: Normal rate and regular rhythm.  Pulmonary:     Effort: Pulmonary effort is normal. No respiratory distress.     Breath sounds: Normal breath sounds. No stridor. No wheezing or rales.  Chest:     Chest wall: Tenderness present. No crepitus.  Abdominal:     General: Bowel sounds are normal.  There is no distension.     Palpations: Abdomen is soft.     Tenderness: There is no abdominal tenderness. There is no guarding or rebound.  Musculoskeletal:        General: No tenderness.     Cervical back: Neck supple.  Skin:    General: Skin is warm and dry.     Findings: No rash.  Neurological:     Mental Status: He is alert.     Cranial Nerves: No cranial nerve deficit (no facial droop, extraocular movements intact, no slurred speech).     Sensory: No sensory deficit.     Motor: No abnormal muscle tone or seizure activity.     Coordination: Coordination normal.     ED Results / Procedures / Treatments   Labs (all labs ordered are listed, but only abnormal results are displayed) Labs Reviewed  BASIC METABOLIC PANEL - Abnormal; Notable for the following components:      Result Value   Potassium 3.3 (*)    Glucose, Bld 114 (*)    All other components within normal limits  CBC  TROPONIN I (HIGH SENSITIVITY)    EKG EKG Interpretation  Date/Time:  Monday April 03 2020 09:28:11 EDT Ventricular Rate:  87 PR Interval:    QRS Duration: 100 QT Interval:  356 QTC Calculation: 429 R Axis:   91 Text Interpretation: Sinus rhythm Borderline short PR interval Borderline right axis deviation 12 Lead; Mason-Likar No significant change since last tracing Confirmed by Linwood Dibbles [phone]) on 04/03/2020 10:39:21 AM   Radiology DG Chest 2 View  Result Date: 04/03/2020 CLINICAL DATA:  Chest pain. EXAM: CHEST - 2 VIEW COMPARISON:  01/01/2020. FINDINGS: The heart size and mediastinal contours are within normal limits. Both lungs are clear. No pleural effusions or visible pneumothorax. No acute osseous abnormality. Mild dextrocurvature of the upper thoracic spine. IMPRESSION: No active cardiopulmonary disease. Electronically Signed   By: Feliberto Harts MD   On: 04/03/2020 09:46    Procedures Procedures (including critical care time)  Medications Ordered in ED Medications - No data  to display  ED Course  I have reviewed the triage vital signs and the nursing notes.  Pertinent labs & imaging results that were available during my care of the patient were reviewed by me and considered in my medical decision making (see chart for details).  Clinical Course as of Apr 03 1224  Mon Apr 03, 2020  1124 Chest x-ray without acute findings   [JK]  1124 Laboratory tests unremarkable   [JK]    Clinical Course User Index [JK] Linwood Dibbles, MD   MDM Rules/Calculators/A&P HEAR Score:  1                        Patient presented to ED for evaluation of chest pain.  Symptoms atypical for cardiac etiology.  Low risk heart score.  Troponin negative.  Per heart pathway second troponin is not necessary.  Patient is not having any dyspnea.  No fevers.  I doubt acute infection.  I doubt PE or dissection.  He does have chest wall tenderness and I suspect this is the etiology of his pain.  I will give a prescription for Voltaren gel that was previously recommended by his doctor. Final Clinical Impression(s) / ED Diagnoses Final diagnoses:  Chest wall pain    Rx / DC Orders ED Discharge Orders         Ordered    diclofenac Sodium (VOLTAREN) 1 % GEL  4 times daily        04/03/20 1223           Linwood Dibbles, MD 04/03/20 1225

## 2020-04-03 NOTE — ED Triage Notes (Signed)
Pt states he has been having upper mid and left chest pain for about 2 months, it has become worse the last few days, he was seen at [facility] but did not follow up outpatient. Pt states he was sent home from work on Sat and took 7 Asprin. Pt hyperventilating in triage

## 2020-04-03 NOTE — Discharge Instructions (Signed)
Take the Voltaren gel medications to help with your pain.  Follow-up with your doctor if you continue to have trouble with your chest pain.

## 2020-06-19 ENCOUNTER — Encounter (HOSPITAL_COMMUNITY): Payer: Self-pay | Admitting: *Deleted

## 2020-06-19 ENCOUNTER — Other Ambulatory Visit: Payer: Self-pay

## 2020-06-19 ENCOUNTER — Emergency Department (HOSPITAL_COMMUNITY)
Admission: EM | Admit: 2020-06-19 | Discharge: 2020-06-19 | Disposition: A | Discharge status: Home / Self Care | Payer: 59 | Attending: Emergency Medicine | Admitting: Emergency Medicine

## 2020-06-19 DIAGNOSIS — R059 Cough, unspecified: Secondary | ICD-10-CM | POA: Insufficient documentation

## 2020-06-19 DIAGNOSIS — F17200 Nicotine dependence, unspecified, uncomplicated: Secondary | ICD-10-CM | POA: Diagnosis not present

## 2020-06-19 DIAGNOSIS — R197 Diarrhea, unspecified: Secondary | ICD-10-CM | POA: Insufficient documentation

## 2020-06-19 DIAGNOSIS — J4520 Mild intermittent asthma, uncomplicated: Secondary | ICD-10-CM | POA: Insufficient documentation

## 2020-06-19 DIAGNOSIS — R509 Fever, unspecified: Secondary | ICD-10-CM | POA: Diagnosis not present

## 2020-06-19 DIAGNOSIS — Z20822 Contact with and (suspected) exposure to covid-19: Secondary | ICD-10-CM | POA: Insufficient documentation

## 2020-06-19 LAB — RESP PANEL BY RT-PCR (FLU A&B, COVID) ARPGX2
Influenza A by PCR: NEGATIVE
Influenza B by PCR: NEGATIVE
SARS Coronavirus 2 by RT PCR: NEGATIVE

## 2020-06-19 NOTE — Discharge Instructions (Signed)

## 2020-06-19 NOTE — ED Provider Notes (Signed)
[facility] EMERGENCY DEPARTMENT Provider Note   CSN: [id number] Arrival date & time: 06/19/20  0844     History Chief Complaint  Patient presents with  . Cough  . Fever    Kenneth Bautista is a 35 y.o. male.   Cough Cough characteristics:  Non-productive Sputum characteristics:  Nondescript Severity:  Moderate Onset quality:  Gradual Duration:  2 days Timing:  Constant Progression:  Waxing and waning Chronicity:  New Context: sick contacts and upper respiratory infection   Relieved by:  Nothing Worsened by:  Nothing Ineffective treatments:  None tried Associated symptoms: fever   Associated symptoms: no chest pain, no chills, no headaches, no rash, no rhinorrhea and no shortness of breath   Fever Associated symptoms: cough and diarrhea   Associated symptoms: no chest pain, no chills, no congestion, no dysuria, no headaches, no nausea, no rash, no rhinorrhea and no vomiting        Past Medical History:  Diagnosis Date  . Arthritis   . Asthma     Patient Active Problem List   Diagnosis Date Noted  . Tobacco use disorder 02/21/2020  . Asthma, mild intermittent 02/21/2020  . Dental caries 02/21/2020  . Costochondritis 02/21/2020    History reviewed. No pertinent surgical history.     History reviewed. No pertinent family history.  Social History   Tobacco Use  . Smoking status: Current Every Day Smoker  . Smokeless tobacco: Never Used  Substance Use Topics  . Alcohol use: Yes  . Drug use: Not Currently    Types: Marijuana    Home Medications Prior to Admission medications   Medication Sig Start Date End Date Taking? Authorizing Provider  albuterol (VENTOLIN HFA) 108 (90 Base) MCG/ACT inhaler Inhale 2 puffs into the lungs every 6 (six) hours as needed for wheezing or shortness of breath. 01/27/20   Arvilla Market, DO  diclofenac Sodium (VOLTAREN) 1 % GEL Apply 2 g topically 4 (four) times daily. 04/03/20   Linwood Dibbles, MD     Allergies    Patient has no known allergies.  Review of Systems   Review of Systems  Constitutional: Positive for fever. Negative for chills.  HENT: Negative for congestion and rhinorrhea.   Respiratory: Positive for cough. Negative for shortness of breath.   Cardiovascular: Negative for chest pain and palpitations.  Gastrointestinal: Positive for diarrhea. Negative for anal bleeding, nausea and vomiting.  Genitourinary: Negative for difficulty urinating and dysuria.  Musculoskeletal: Negative for arthralgias and back pain.  Skin: Negative for color change and rash.  Neurological: Negative for light-headedness and headaches.    Physical Exam Updated Vital Signs BP (!) 151/75 (BP Location: Left Arm)   Pulse 85   Temp 98.3 F (36.8 C) (Oral)   Resp 16   Ht 5\' 6"  (1.676 m)   Wt 63.5 kg   SpO2 98%   BMI 22.60 kg/m   Physical Exam Vitals and nursing note reviewed.  Constitutional:      General: He is not in acute distress.    Appearance: Normal appearance.  HENT:     Head: Normocephalic and atraumatic.     Nose: No rhinorrhea.     Mouth/Throat:     Mouth: Mucous membranes are moist.  Eyes:     General:        Right eye: No discharge.        Left eye: No discharge.     Conjunctiva/sclera: Conjunctivae normal.  Cardiovascular:  Rate and Rhythm: Normal rate and regular rhythm.  Pulmonary:     Effort: Pulmonary effort is normal. No respiratory distress.     Breath sounds: No stridor. No wheezing, rhonchi or rales.  Abdominal:     General: Abdomen is flat. There is no distension.     Palpations: Abdomen is soft.  Musculoskeletal:        General: No deformity or signs of injury.  Skin:    General: Skin is warm and dry.     Capillary Refill: Capillary refill takes less than 2 seconds.  Neurological:     General: No focal deficit present.     Mental Status: He is alert. Mental status is at baseline.     Motor: No weakness.  Psychiatric:        Mood and  Affect: Mood normal.        Behavior: Behavior normal.        Thought Content: Thought content normal.     ED Results / Procedures / Treatments   Labs (all labs ordered are listed, but only abnormal results are displayed) Labs Reviewed  RESP PANEL BY RT-PCR (FLU A&B, COVID) [id number]    EKG None  Radiology No results found.  Procedures Procedures (including critical care time)  Medications Ordered in ED Medications - No data to display  ED Course  I have reviewed the triage vital signs and the nursing notes.  Pertinent labs & imaging results that were available during my care of the patient were reviewed by me and considered in my medical decision making (see chart for details).    MDM Rules/Calculators/A&P                          Well-appearing 35 year old male exposed to COVID with viral illness type symptoms to include cough congestion fevers chills and diarrhea.  Overall well-appearing with a soft abdomen normal work of breathing clear lungs stable vital signs.  COVID test sent and pending at time of discharge with precautions and quarantine instructions given.  Patient agrees with outpatient follow-up.  He is not vaccinated but is planning on getting it later this month.  Pt is safe for DC home with outpatient follow up. The patient agrees with the plan and has no other questions or concerns.  Final Clinical Impression(s) / ED Diagnoses Final diagnoses:  Cough  Fever, unspecified fever cause  Close exposure to COVID-19 virus    Rx / DC Orders ED Discharge Orders    None       Sabino Donovan, MD 06/19/20 [phone]

## 2020-06-19 NOTE — ED Triage Notes (Signed)
Pt reports having mild cough and low grade fever x 2 days.

## 2020-06-22 ENCOUNTER — Encounter: Payer: Self-pay | Admitting: Internal Medicine

## 2020-06-22 ENCOUNTER — Ambulatory Visit (INDEPENDENT_AMBULATORY_CARE_PROVIDER_SITE_OTHER): Payer: 59 | Admitting: Internal Medicine

## 2020-06-22 ENCOUNTER — Other Ambulatory Visit: Payer: Self-pay

## 2020-06-22 VITALS — BP 119/75 | HR 83 | Temp 98.1°F | Resp 18 | Ht 66.0 in | Wt 144.0 lb

## 2020-06-22 DIAGNOSIS — J069 Acute upper respiratory infection, unspecified: Secondary | ICD-10-CM | POA: Diagnosis not present

## 2020-06-22 DIAGNOSIS — J4520 Mild intermittent asthma, uncomplicated: Secondary | ICD-10-CM

## 2020-06-22 MED ORDER — BENZONATATE 100 MG PO CAPS: 100.0000 mg | ORAL_CAPSULE | Freq: Two times a day (BID) | ORAL | 0 refills | Status: DC | PRN

## 2020-06-22 NOTE — Progress Notes (Signed)
  Subjective:    Kenneth Bautista - 35 y.o. male MRN [id number]  Date of birth: February 25, 1986  HPI  RANBIR CHEW is here for asthma follow up. Patient had ED visit on 1/17 for cough and chest pain. At the time was thought to be viral in nature. COVID test was obtained due to close exposure and was negative. Patient has albuterol inhaler for prn use. Not currently on controller medication. Reports was previously febrile, now afebrile. Symptoms have improved but still has mild cough and congestion. No SOB or wheezing. Has been using Albuterol slightly more often maybe 2x per day with his cough. No nighttime awakenings or need for rescue inhaler.      Health Maintenance:  Health Maintenance Due  Topic Date Due  . Hepatitis C Screening  Never done    -  reports that he has been smoking. He has never used smokeless tobacco. - Review of Systems: Per HPI. - Past Medical History: Patient Active Problem List   Diagnosis Date Noted  . Tobacco use disorder 02/21/2020  . Asthma, mild intermittent 02/21/2020  . Dental caries 02/21/2020  . Costochondritis 02/21/2020   - Medications: reviewed and updated   Objective:   Physical Exam BP 119/75 (BP Location: Right Arm, Patient Position: Sitting, Cuff Size: Normal)   Pulse 83   Temp 98.1 F (36.7 C) (Oral)   Resp 18   Ht 5\' 6"  (1.676 m)   Wt 144 lb (65.3 kg)   SpO2 95%   BMI 23.24 kg/m  Physical Exam Constitutional:      General: He is not in acute distress.    Appearance: He is not diaphoretic.  HENT:     Head: Normocephalic and atraumatic.  Eyes:     Extraocular Movements: EOM normal.     Conjunctiva/sclera: Conjunctivae normal.  Cardiovascular:     Rate and Rhythm: Normal rate and regular rhythm.     Heart sounds: Normal heart sounds. No murmur heard.   Pulmonary:     Effort: Pulmonary effort is normal. No respiratory distress.     Breath sounds: Normal breath sounds.  Musculoskeletal:        General: Normal range of motion.   Skin:    General: Skin is warm and dry.  Neurological:     Mental Status: He is alert and oriented to person, place, and time.  Psychiatric:        Mood and Affect: Affect normal.        Judgment: Judgment normal.            Assessment & Plan:   1. Mild intermittent asthma without complication No signs of acute exacerbation. Continue Albuterol prn.   2. Viral URI with cough Suspect common cold as etiology of his symptoms. Recently COVID neg. Tessalon Rx given. Discussed other supportive care measures can take for symptom improvement/relief. Return precautions given underlying h/o asthma discussed.  - benzonatate (TESSALON) 100 MG capsule; Take 1 capsule (100 mg total) by mouth 2 (two) times daily as needed for cough.  Dispense: 20 capsule; Refill: 0   , D.O. 06/22/2020, 9:31 AM Primary Care at [facility]

## 2020-08-17 ENCOUNTER — Observation Stay (HOSPITAL_COMMUNITY)
Admission: EM | Admit: 2020-08-17 | Discharge: 2020-08-18 | Disposition: A | Discharge status: Home / Self Care | Payer: 59 | Attending: Internal Medicine | Admitting: Internal Medicine

## 2020-08-17 ENCOUNTER — Other Ambulatory Visit: Payer: Self-pay

## 2020-08-17 ENCOUNTER — Encounter (HOSPITAL_COMMUNITY): Payer: Self-pay

## 2020-08-17 ENCOUNTER — Emergency Department (HOSPITAL_COMMUNITY): Payer: 59

## 2020-08-17 DIAGNOSIS — R0789 Other chest pain: Secondary | ICD-10-CM | POA: Diagnosis not present

## 2020-08-17 DIAGNOSIS — J4520 Mild intermittent asthma, uncomplicated: Secondary | ICD-10-CM

## 2020-08-17 DIAGNOSIS — N179 Acute kidney failure, unspecified: Secondary | ICD-10-CM | POA: Insufficient documentation

## 2020-08-17 DIAGNOSIS — F17200 Nicotine dependence, unspecified, uncomplicated: Secondary | ICD-10-CM | POA: Insufficient documentation

## 2020-08-17 DIAGNOSIS — Z720 Tobacco use: Secondary | ICD-10-CM

## 2020-08-17 DIAGNOSIS — F1410 Cocaine abuse, uncomplicated: Secondary | ICD-10-CM

## 2020-08-17 DIAGNOSIS — R42 Dizziness and giddiness: Secondary | ICD-10-CM | POA: Diagnosis present

## 2020-08-17 DIAGNOSIS — R7989 Other specified abnormal findings of blood chemistry: Secondary | ICD-10-CM

## 2020-08-17 DIAGNOSIS — Z20822 Contact with and (suspected) exposure to covid-19: Secondary | ICD-10-CM | POA: Diagnosis not present

## 2020-08-17 DIAGNOSIS — J45909 Unspecified asthma, uncomplicated: Secondary | ICD-10-CM | POA: Insufficient documentation

## 2020-08-17 DIAGNOSIS — Z79899 Other long term (current) drug therapy: Secondary | ICD-10-CM | POA: Insufficient documentation

## 2020-08-17 DIAGNOSIS — R079 Chest pain, unspecified: Secondary | ICD-10-CM | POA: Diagnosis not present

## 2020-08-17 DIAGNOSIS — F1910 Other psychoactive substance abuse, uncomplicated: Secondary | ICD-10-CM | POA: Diagnosis not present

## 2020-08-17 DIAGNOSIS — F1010 Alcohol abuse, uncomplicated: Secondary | ICD-10-CM | POA: Diagnosis not present

## 2020-08-17 DIAGNOSIS — R778 Other specified abnormalities of plasma proteins: Secondary | ICD-10-CM

## 2020-08-17 DIAGNOSIS — E860 Dehydration: Secondary | ICD-10-CM

## 2020-08-17 LAB — CBC
HCT: 48.9 % (ref 39.0–52.0)
Hemoglobin: 17.2 g/dL — ABNORMAL HIGH (ref 13.0–17.0)
MCH: 31.8 pg (ref 26.0–34.0)
MCHC: 35.2 g/dL (ref 30.0–36.0)
MCV: 90.4 fL (ref 80.0–100.0)
Platelets: 369 10*3/uL (ref 150–400)
RBC: 5.41 MIL/uL (ref 4.22–5.81)
RDW: 12.8 % (ref 11.5–15.5)
WBC: 9.5 10*3/uL (ref 4.0–10.5)
nRBC: 0 % (ref 0.0–0.2)

## 2020-08-17 LAB — BASIC METABOLIC PANEL
Anion gap: 15 (ref 5–15)
BUN: 16 mg/dL (ref 6–20)
CO2: 23 mmol/L (ref 22–32)
Calcium: 9.7 mg/dL (ref 8.9–10.3)
Chloride: 96 mmol/L — ABNORMAL LOW (ref 98–111)
Creatinine, Ser: 1.25 mg/dL — ABNORMAL HIGH (ref 0.61–1.24)
GFR, Estimated: >60 mL/min (ref >60)
Glucose, Bld: 90 mg/dL (ref 70–99)
Potassium: 3.6 mmol/L (ref 3.5–5.1)
Sodium: 134 mmol/L — ABNORMAL LOW (ref 135–145)

## 2020-08-17 LAB — URINALYSIS, ROUTINE W REFLEX MICROSCOPIC
Bilirubin Urine: NEGATIVE
Glucose, UA: NEGATIVE mg/dL
Hgb urine dipstick: NEGATIVE
Ketones, ur: 80 mg/dL — AB
Leukocytes,Ua: NEGATIVE
Nitrite: NEGATIVE
Protein, ur: NEGATIVE mg/dL
Specific Gravity, Urine: 1.025 (ref 1.005–1.030)
pH: 5 (ref 5.0–8.0)

## 2020-08-17 LAB — HEPATIC FUNCTION PANEL
ALT: 22 U/L (ref 0–44)
AST: 26 U/L (ref 15–41)
Albumin: 5.2 g/dL — ABNORMAL HIGH (ref 3.5–5.0)
Alkaline Phosphatase: 77 U/L (ref 38–126)
Bilirubin, Direct: 0.2 mg/dL (ref 0.0–0.2)
Indirect Bilirubin: 1.2 mg/dL — ABNORMAL HIGH (ref 0.3–0.9)
Total Bilirubin: 1.4 mg/dL — ABNORMAL HIGH (ref 0.3–1.2)
Total Protein: 8.1 g/dL (ref 6.5–8.1)

## 2020-08-17 LAB — LACTIC ACID, PLASMA
Lactic Acid, Venous: 2.1 mmol/L (ref 0.5–1.9)
Lactic Acid, Venous: 1 mmol/L (ref 0.5–1.9)

## 2020-08-17 LAB — TSH: TSH: 1.801 u[IU]/mL (ref 0.350–4.500)

## 2020-08-17 LAB — LIPASE, BLOOD: Lipase: 25 U/L (ref 11–51)

## 2020-08-17 LAB — SARS CORONAVIRUS 2 (TAT 6-24 HRS): SARS Coronavirus 2: NEGATIVE

## 2020-08-17 LAB — TROPONIN I (HIGH SENSITIVITY)
Troponin I (High Sensitivity): 6 ng/L (ref <18)
Troponin I (High Sensitivity): 52 ng/L — ABNORMAL HIGH (ref <18)
Troponin I (High Sensitivity): 58 ng/L — ABNORMAL HIGH (ref <18)

## 2020-08-17 MED ORDER — SODIUM CHLORIDE 0.9 % IV BOLUS
1000.0000 mL | Freq: Once | INTRAVENOUS | Status: AC
  Administered 2020-08-17: 1000 mL via INTRAVENOUS

## 2020-08-17 MED ORDER — LIDOCAINE VISCOUS HCL 2 % MT SOLN
15.0000 mL | Freq: Once | OROMUCOSAL | Status: AC
  Administered 2020-08-17: 15 mL via ORAL
  Filled 2020-08-17: qty 15

## 2020-08-17 MED ORDER — ALUM & MAG HYDROXIDE-SIMETH 200-200-20 MG/5ML PO SUSP
30.0000 mL | Freq: Once | ORAL | Status: AC
  Administered 2020-08-17: 30 mL via ORAL
  Filled 2020-08-17: qty 30

## 2020-08-17 MED ORDER — LIDOCAINE 5 % EX PTCH
1.0000 | MEDICATED_PATCH | CUTANEOUS | Status: DC
  Administered 2020-08-18: 1 via TRANSDERMAL
  Filled 2020-08-17: qty 1

## 2020-08-17 MED ORDER — PANTOPRAZOLE SODIUM 40 MG PO TBEC: 40.0000 mg | DELAYED_RELEASE_TABLET | Freq: Every day | ORAL | Status: DC

## 2020-08-17 MED ORDER — ACETAMINOPHEN 325 MG PO TABS
650.0000 mg | ORAL_TABLET | Freq: Four times a day (QID) | ORAL | Status: DC | PRN
  Administered 2020-08-18: 650 mg via ORAL
  Filled 2020-08-17: qty 2

## 2020-08-17 MED ORDER — ASPIRIN EC 81 MG PO TBEC
81.0000 mg | DELAYED_RELEASE_TABLET | Freq: Every day | ORAL | Status: DC
  Administered 2020-08-18: 81 mg via ORAL
  Filled 2020-08-17: qty 1

## 2020-08-17 MED ORDER — ASPIRIN 81 MG PO CHEW
324.0000 mg | CHEWABLE_TABLET | Freq: Every day | ORAL | Status: AC
  Administered 2020-08-17: 324 mg via ORAL
  Filled 2020-08-17: qty 4

## 2020-08-17 MED ORDER — SODIUM CHLORIDE 0.9% FLUSH
3.0000 mL | Freq: Two times a day (BID) | INTRAVENOUS | Status: DC
  Administered 2020-08-18 (×2): 3 mL via INTRAVENOUS

## 2020-08-17 MED ORDER — ACETAMINOPHEN 650 MG RE SUPP: 650.0000 mg | Freq: Four times a day (QID) | RECTAL | Status: DC | PRN

## 2020-08-17 MED ORDER — ENOXAPARIN SODIUM 40 MG/0.4ML ~~LOC~~ SOLN
40.0000 mg | SUBCUTANEOUS | Status: DC
  Administered 2020-08-18: 40 mg via SUBCUTANEOUS
  Filled 2020-08-17: qty 0.4

## 2020-08-17 MED ORDER — PROMETHAZINE HCL 25 MG PO TABS: 12.5000 mg | ORAL_TABLET | Freq: Four times a day (QID) | ORAL | Status: DC | PRN

## 2020-08-17 MED ORDER — NICOTINE 14 MG/24HR TD PT24
14.0000 mg | MEDICATED_PATCH | Freq: Every day | TRANSDERMAL | Status: DC
  Administered 2020-08-17 – 2020-08-18 (×2): 14 mg via TRANSDERMAL
  Filled 2020-08-17 (×2): qty 1

## 2020-08-17 NOTE — H&P (Addendum)
Date: 08/17/2020               Patient Name:  Kenneth Bautista MRN: [id number]  DOB: 1986-04-10 Age / Sex: 35 y.o., male   PCP: Arvilla Market, DO         Medical Service: Internal Medicine Teaching Service         Attending Physician: Dr. Earl Lagos, MD    First Contact: Dr. Thalia Bloodgood Pager: [phone]  Second Contact: Dr. Dellia Cloud Pager: [phone]       After Hours (After 5p/  First Contact Pager: [phone]  weekends / holidays): Second Contact Pager: [phone]   Chief Complaint: fatigue, chest pain  History of Present Illness: Mr Kenneth Bautista is a 35 year old male with PMHx of asthma presenting with 3-4 days of decreased appetite, increasing fatigue and notes that he has been mostly sleeping over the past three days. He notes increasing generalized weakness. Has not been eating or drinking much due to decreased appetite Notes that this morning, he had some chest pain for which he took two aspirins and fell asleep. He notes having some dizziness when he awoke earlier but denies any nausea/vomiting. He notes having intermittent right-sided chest pain over the past several months. Over this past week, he has had 2-3 episodes of right sided chest pain that has awaken him from sleep. Also endorses some chest pain when walking to work.  However, this does not always helps symptoms. He notes that approximately one week ago, he had a squeezing type pain across his entire chest radiating to his back. He notes not being able to breath during this episode. Each episode usually lasts approximately one hour, couple times per week, sometimes daily. Denies any current chest pain, shortness of breath, nausea/vomiting, recent fevers/chills, runny nose, cough,  loss of consciousness, palpitations, abdominal pain, diarrhea/constipation, or urinary symptoms.  Notes one episode of loose stool earlier today after getting fluids.   Meds:  Current Meds  Medication Sig  . albuterol  (VENTOLIN HFA) 108 (90 Base) MCG/ACT inhaler Inhale 2 puffs into the lungs every 6 (six) hours as needed for wheezing or shortness of breath.   Allergies: Allergies as of 08/17/2020  . (No Known Allergies)   Past Medical History:  Diagnosis Date  . Arthritis   . Asthma    Family History:  Family History  Problem Relation Age of Onset  . Heart disease Mother        details unknown  . Heart disease Father        details unknown  Father - cancer, passed of MI at age 31   Social History:  Patient lives by himself. He works at [employer]. He currently smokes 10-15 cigarettes/day for 15 years. He also smokes marijuana, last use was Sunday. Smokes cocaine as well occasionally - last smoke was on Wednesday.  He drinks up to 1/5 liquor per day, last drink was one week ago. Usually drinks 1/2 bottle every 2 weeks. Previously drinking a lot more but has cut down since his DUI in December.   Review of Systems: A complete ROS was negative except as per HPI.   Physical Exam: Blood pressure 123/74, pulse 76, temperature 97.8 F (36.6 C), resp. rate (!) 22, height 5\' 6"  (1.676 m), weight 68 kg, SpO2 100 %. Physical Exam Constitutional:      General: He is not in acute distress.    Comments: Hands are soiled with black stains on clothing  HENT:     Head: Normocephalic and atraumatic.     Right Ear: External ear normal.     Left Ear: External ear normal.     Mouth/Throat:     Mouth: Mucous membranes are dry.  Eyes:     Extraocular Movements: Extraocular movements intact.     Pupils: Pupils are equal, round, and reactive to light.  Cardiovascular:     Rate and Rhythm: Normal rate and regular rhythm.     Pulses: Normal pulses.     Heart sounds: Normal heart sounds. No murmur heard. No friction rub. No gallop.      Comments: Distal pulses intact, ttp of the sternal area Pulmonary:     Effort: Pulmonary effort is normal.     Breath sounds: No wheezing or rhonchi.  Abdominal:     General:  Abdomen is flat. There is no distension.     Palpations: There is no mass.     Tenderness: There is abdominal tenderness (epigastric).  Musculoskeletal:        General: No signs of injury. Normal range of motion.     Cervical back: Normal range of motion and neck supple.     Right lower leg: No edema.     Left lower leg: No edema.  Skin:    General: Skin is warm and dry.     Capillary Refill: Capillary refill takes less than 2 seconds.  Neurological:     General: No focal deficit present.     Mental Status: He is alert and oriented to person, place, and time.     Cranial Nerves: No cranial nerve deficit.     Motor: No weakness.  Psychiatric:        Mood and Affect: Mood normal.        Behavior: Behavior normal.    EKG: personally reviewed my interpretation is normal sinus rhythm, No ST segment changes.   CXR: The heart size and mediastinal contours are within normal limits. Both lungs are clear. The visualized skeletal structures are unremarkable.  Assessment & Plan by Problem: Active Problems:   Atypical chest pain  Mr Kenneth Bautista is a 35 year old male with PMHx of asthma presenting with 3-4 days of decreased appetite, fatigue, dizziness, and episodes of atypical chest pain.   Atypical chest pain Patient reports intermittent episodes of right sided chest pain starting in July of 2021. Pain generally wakes him out of sleep and lasts about 1 hour. Prior EKG unremarkable. Given Voltaren gel for costochondritis by PCP without improvement. Tachy to 110s on arrival with BP of 99/70 in setting of poor PO intake. Improved with IV fluids HR now in 70s, HD stable. EKG in NSR with no ST changes. Troponin 6>52>56. TSH and lipase wnl. UDS and COVID pending. CP may be due to cocaine use. Does endorse recent cocaine use yesterday after chest pain started states this somewhat helped with pain. PE less likely Wells score of 1.5 for recent immobility, pericarditis also a possibility likely has viral  illness. Checking EHO for structural heart disease.  If further cardiac workup negative possible this is due to costochondritis. Pt endorses frequently lifting heavy boxes that worsens pain with reproducible pain on exam.  - Cardiology consult, appreciate recommendations - continue ASA 81 mg   - Lidocaine patch for pain - Follow up echo - Follow up lipid panel - Avoid BB in setting of recent cocaine use - Monitor on tele  AKI Fatigue, lightheadedness Presents with fatigue and  decreased appetite for the past 3 days. Tachycardiac and mildly hypotensive on arrival with improvement with 2L NS in the ED. Currently afebrile with, vitals stable. No leukocytosis. Hemoglobin of 17.2 and Cr. of 1.25 in the setting of decreased PO intake. Lactic acid of 2.1, now resolvedPossibly due to acute viral illness. COVID pending. - Follow up COVID test - Restart diet and encourage PO intake - CMP in AM  Asthma, mild intermittent No symptoms of dyspnea, stable on room air, no wheezing on exam. -Continue albuterol   Substance use disorder Patient his history of tobacco, alcohol, marajuana, and cocaine use. Reports last drink was Thursday, and drank half a bottle of gin. Drinks inconsistently when he gets paid. Has been trying to drink less since DUI in December. Denies prior symptoms of withdrawals or DTs. Last used cocaine yesterday states he does this intermittently for chest pain.  - Follow up UDS - nicotine patch - Continue to encourage cessation  Dispo: Admit patient to Observation with expected length of stay less than 2 midnights.  Signed: Quincy Simmonds, MD 08/17/2020, 8:42 PM  Pager: [phone] After 5pm on weekdays and 1pm on weekends: On Call pager: [phone]

## 2020-08-17 NOTE — ED Provider Notes (Signed)
Please see provider Fayrene Helper, PA-C documentation for full HPI and MDM.  Received a call from Cardiology APP. Patient was evaluated by Cardiology with initial plans for Cardiology admission, however after evaluation patient's presentation was felt to be more viral/dehydration etiology. Cardiology states they will round on patient and there are plans for ECHO and trend troponins.   Consult placed for admission, Internal Medicine teaching service to admit.   Robinson, [place] N, PA-C 08/17/20 1847    Gerhard Munch, MD 08/21/20 [phone]

## 2020-08-17 NOTE — Consult Note (Addendum)
Cardiology Consultation   Patient ID: Kenneth Bautista MRN: [id number]; DOB: May 09, 1986   Admission date: 08/17/2020  PCP:  Arvilla Market, DO   [facility]  Cardiologist:  New to Dr. Mayford Knife Advanced Practice Provider:  No care team member to display Electrophysiologist:  None   Chief Complaint:  Weak, lightheaded, no appetite, chest pain  Patient Profile:   Kenneth Bautista is a 35 y.o. male with a hx of asthma, tobacco/THC use, cocaine use, sporadic ETOH use who is being seen today for the evaluation of chest pain at the request of Dr. Jeraldine Loots.  History of Present Illness:   Kenneth Bautista denies any prior cardiac history or workup. His parents had heart disease but he does not recall the details. He has prior history of heavier ETOH use but cut down since his DUI and now drinks about a fifth of gin every 1-2 weeks. He has smoked about 1ppd since age 58. He originally denied cocaine to me, but reported to MD that his last use was 8 days ago. He has been under a lot of stress lately. He was previously homeless but now resides in an apartment.  He has been having chest pain for about 4-5 months. He states he's previously had this evaluated by a regular medical doctor and was told it was nothing. He has a really hard time describing it. It is across his chest, nonradiating. It happens a few times a week up to an hour. Sometimes it wakes him up out of sleep. He usually can convince himself to go back to sleep and when he wakes up it is gone. He does a lot of walking for his job at [employer]. This sometimes happens when he exerts himself but other times does not affect him at all. He has been feeling poorly the last few days in a non-descript way - general malaise, weakness, poor appetite, states he has barely eaten or drank in 3 days. Denies palpitations, fevers, chills, nausea, vomiting, or diarrhea - but did feel feverish yesterday. States he had to lay in bed for  several days. He got up for work this morning and when he arrived felt lightheaded. He mentioned feeling poorly to his boss who recommended he come to the hospital. On the way here he had recurrent chest discomfort as well as some SOB as well. He states he does not usually get SOB otherwise. He received a GI cocktail with relief of pain. No recent long travel (rode train to [place] to pay a fine recently) or surgery. Chest pain was not worse with inspiration or palpation. No edema or orthopnea. VSS. Labs show mild hyponatremia/hypochoremia, elevated albumin, elevated TBili, Hgb 17.2, lactic acidosis of 2.1, hsTroponin 6->52, UA with ketones, Covid swab pending, TSH wnl, AKI with Cr 1.25 (previously 0.9-1). He was also given IV fluids due to his elevated lactic acid. Marland Kitchen He denies any current symptoms and is feeling much better presently.  Past Medical History:  Diagnosis Date   Arthritis    Asthma     Past Surgical History:  Procedure Laterality Date   None       Medications Prior to Admission: Prior to Admission medications   Medication Sig Start Date End Date Taking? Authorizing Provider  albuterol (VENTOLIN HFA) 108 (90 Base) MCG/ACT inhaler Inhale 2 puffs into the lungs every 6 (six) hours as needed for wheezing or shortness of breath. 01/27/20  Yes Arvilla Market, DO  benzonatate (TESSALON) 100 MG  capsule Take 1 capsule (100 mg total) by mouth 2 (two) times daily as needed for cough. Patient not taking: Reported on 08/17/2020 06/22/20   Arvilla Market, DO     Allergies:   No Known Allergies  Social History:   Social History   Socioeconomic History   Marital status: Single    Spouse name: Not on file   Number of children: Not on file   Years of education: Not on file   Highest education level: Not on file  Occupational History   Not on file  Tobacco Use   Smoking status: Current Every Day Smoker   Smokeless tobacco: Never Used   Tobacco comment: since age  57  Vaping Use   Vaping Use: Never used  Substance and Sexual Activity   Alcohol use: Yes    Comment: 1/5th of gin every 1-2 weeks   Drug use: Yes    Types: Marijuana, Cocaine   Sexual activity: Not on file  Other Topics Concern   Not on file  Social History Narrative   Not on file   Social Determinants of Health   Financial Resource Strain: Not on file  Food Insecurity: Not on file  Transportation Needs: Not on file  Physical Activity: Not on file  Stress: Not on file  Social Connections: Not on file  Intimate Partner Violence: Not on file    Family History:   The patient's family history includes Heart disease in his father and mother.   Father had cancer Mother had hold in her heart and skipped heart beats.  ROS:  Please see the history of present illness.  All other ROS reviewed and negative.     Physical Exam/Data:   Vitals:   08/17/20 1630 08/17/20 1645 08/17/20 1700 08/17/20 1715  BP: 108/68 108/62 124/74   Pulse: 72 67 88 66  Resp: 18 17 18 15   Temp:      SpO2: 98% 100% 98% 100%  Weight:      Height:        Intake/Output Summary (Last 24 hours) at 08/17/2020 1802 Last data filed at 08/17/2020 1440 Gross per 24 hour  Intake 2000 ml  Output --  Net 2000 ml   Last 3 Weights 08/17/2020 06/22/2020 06/19/2020  Weight (lbs) 150 lb 144 lb 140 lb  Weight (kg) 68.04 kg 65.318 kg 63.504 kg     Body mass index is 24.21 kg/m.  General: Thin M in no acute distress. Head: Normocephalic, atraumatic, sclera non-icteric, no xanthomas, nares are without discharge. Neck: Negative for carotid bruits. JVP not elevated. Lungs: Clear bilaterally to auscultation without wheezes, rales, or rhonchi. Breathing is unlabored. Heart: RRR S1 S2 without murmurs, rubs, or gallops.  Abdomen: Soft, non-tender, non-distended with normoactive bowel sounds. No rebound/guarding. Extremities: No clubbing or cyanosis. No edema. Distal pedal pulses are 2+ and equal bilaterally. Hands have  residual grime on them from working on car recently Neuro: Alert and oriented X 3. Moves all extremities spontaneously. Psych:  Responds to questions appropriately with a normal affect.  EKG:  The ECG that was done today was personally reviewed:  1) NSR 67bpm sinus tach 108bpm, possible arm lead reversal, nonspecific STT changes and rounded ST segment in aVL, isolated ST elevation in avR  2) NSR 67bpm, borderline short PR interval 06/21/2020, nonspecific TWI avL, no acute changes  Relevant CV Studies: None  Laboratory Data:  High Sensitivity Troponin:   Recent Labs  Lab 08/17/20 1020 08/17/20 1139  TROPONINIHS 6  52*      Chemistry Recent Labs  Lab 08/17/20 1020  NA 134*  K 3.6  CL 96*  CO2 23  GLUCOSE 90  BUN 16  CREATININE 1.25*  CALCIUM 9.7  GFRNONAA >60  ANIONGAP 15    Recent Labs  Lab 08/17/20 1139  PROT 8.1  ALBUMIN 5.2*  AST 26  ALT 22  ALKPHOS 77  BILITOT 1.4*   Hematology Recent Labs  Lab 08/17/20 1020  WBC 9.5  RBC 5.41  HGB 17.2*  HCT 48.9  MCV 90.4  MCH 31.8  MCHC 35.2  RDW 12.8  PLT 369   BNPNo results for input(s): BNP, PROBNP in the last 168 hours.  DDimer No results for input(s): DDIMER in the last 168 hours.   Radiology/Studies:  DG Chest 2 View  Result Date: 08/17/2020 CLINICAL DATA:  Shortness of breath EXAM: CHEST - 2 VIEW COMPARISON:  04/03/2020 FINDINGS: The heart size and mediastinal contours are within normal limits. Both lungs are clear. The visualized skeletal structures are unremarkable. IMPRESSION: No acute abnormality of the lungs. Electronically Signed   By: Lauralyn PrimesAlex  Bibbey M.D.   On: 08/17/2020 10:46     Assessment and Plan:   1. Malaise, poor appetite, weakness and lightheadedness - not clearly cardiac in etiology, presenting with generalized symptoms as above with indicators of dehydration to include hyponatremia, hypochloremia, AKI and lactic acidosis - ?related to viral illness we are seeing in the community with  similar symptoms - Covid pending, but no specific symptoms to suggest this  2. Elevated troponin with several month history of chest pain - would trend another troponin to assess - he is not tachycardic, tachypneic or hypoxic to suggest PE  - pulses equal bilaterally, no radiation to back, mediastinal contours normal and remains pain free after GI cocktail - per discussion with MD, we doubt his chest pain is related to his initial presentation - his episodic cocaine use could explain the paroxysmal nature of his symptoms - we will start ASA, check lipids in AM, and obtain echocardiogram - hold off heparin per pharmacy unless significant troponin rise (will s/o to on-call app to follow) - see below regarding MD thoughts on ischemic eval - tentatively not planning inpatient workup unless echo significantly abnormal  3. Habitual tobacco/THC use, also episodic alcohol use - patient denied cocaine use initially but then reported last use 8 days ago - will add UDS panel  - recommend cessation - would highly consider social work consult and CIWA with admission orders  4. Asthma - not presently wheezing   Risk Assessment/Risk Scores:     TIMI Risk Score for Unstable Angina or Non-ST Elevation MI:   The patient's TIMI risk score is 2, which indicates a 8% risk of all cause mortality, new or recurrent myocardial infarction or need for urgent revascularization in the next 14 days.  For questions or updates, please contact [facility] Please consult [email or web address] for contact info under    Signed, Laurann MontanaDayna N Dunn, PA-C  08/17/2020 6:02 PM    Personally seen and examined. Agree with APP above with the following comments: Briefly 35 yo M with a history of tobacco abuse, marijuana abuse, alcohol abuse with prior withdrawal symptoms (denied since hist prior DWI), Cocaine abuse last use 08/09/20, Asthma (mild, intermittent with rescue inhaler at home) who presents for CP, Malaise, poor appetite,  weakness and lightheadedness Patient notes that has been diagnosed with a heart attack in the past, possibly around cocaine use, which  he uses sparingly for chronic pain.  When at work, his boss said that he looked ill; and patient notes struggling to make it ED from home. Exam notable for black oil on hands from recent car work, otherwise as above Labs notable for troponin X 2 < 100 Personally reviewed relevant tests; ECG sinus tachy with non-specific T wave changes, tele shows SR without ectopy Would recommend  - trending troponin - discussed nicotine cessation; will need patch - discussed marijuana cessation - discussed cocaine cessation; would hold BB and would defer CCTA in the setting of recent use; ok for PRN nitroglycerin - would get echocardiogram - discussed alcohol cessation- patient has not had withdrawal since his DWI but CIWA would be reasonable - Social Work Consult would be reasonable:  has had frequent ED visits, has no PC MD, and is trying to deal with a variety of substance issues and keep from returning to homelessness  Will continue to follow.  Riley Lam, MD Cardiologist [facility]  [address] [place], [address] [place], [place] [address] [phone]  6:18 PM

## 2020-08-17 NOTE — Progress Notes (Signed)
Last troponin flat at 58, no need for IV heparin unless recurrent issues.

## 2020-08-17 NOTE — ED Provider Notes (Signed)
[facility] EMERGENCY DEPARTMENT Provider Note   CSN: [id number] Arrival date & time: 08/17/20  1000     History Chief Complaint  Patient presents with  . Dizziness    Kenneth Bautista is a 35 y.o. male.  The history is provided by the patient and medical records. No language interpreter was used.  Dizziness    35 year old male significant history of asthma, presented to ED with complaints of lightheadedness.  Patient report for the past 3 days he has been feeling lightheadedness and weak.  States that he is mostly in bed and have not had much of an appetite.  States he has been drinking and eating much at all.  He endorsed a mild chest discomfort but states that this is an ongoing issue for more than a year and states he has been evaluated multiple times work without any definitive diagnosis.  He is unable to describe the chest discomfort but states it is currently not present.  It is nonexertional.  Yesterday he felt a bit feverish.  Patient however denies having any headache, runny nose sneezing coughing loss of taste or smell no sore throat shortness of breath abdominal pain dysuria focal numbness or weakness.  Also denies nausea vomiting diarrhea and denies any postprandial pain.  Admits to tobacco use and smoke approximately 10 cigarettes a day.  Endorse occasional marijuana use but denies other drug use.  Denies alcohol abuse.  He does take NSAIDs on a regular basis for his chest pain but denies any black tarry stools or any abnormal bleeding.  No complaint of any abdominal pain.  He denies any hx of thyroid disease.  Does admits that he is going through a lot of stress but denies feeling depressed.  No history of IV drug use.  He has not been vaccinated for COVID-19 he does report family history of cardiac disease.  Past Medical History:  Diagnosis Date  . Arthritis   . Asthma     Patient Active Problem List   Diagnosis Date Noted  . Tobacco use disorder  02/21/2020  . Asthma, mild intermittent 02/21/2020  . Dental caries 02/21/2020  . Costochondritis 02/21/2020    History reviewed. No pertinent surgical history.     History reviewed. No pertinent family history.  Social History   Tobacco Use  . Smoking status: Current Every Day Smoker  . Smokeless tobacco: Never Used  Vaping Use  . Vaping Use: Never used  Substance Use Topics  . Alcohol use: Yes  . Drug use: Not Currently    Types: Marijuana    Home Medications Prior to Admission medications   Medication Sig Start Date End Date Taking? Authorizing Provider  albuterol (VENTOLIN HFA) 108 (90 Base) MCG/ACT inhaler Inhale 2 puffs into the lungs every 6 (six) hours as needed for wheezing or shortness of breath. 01/27/20   Arvilla Market, DO  benzonatate (TESSALON) 100 MG capsule Take 1 capsule (100 mg total) by mouth 2 (two) times daily as needed for cough. 06/22/20   Arvilla Market, DO  diclofenac Sodium (VOLTAREN) 1 % GEL Apply 2 g topically 4 (four) times daily. Patient not taking: Reported on 06/22/2020 04/03/20   Linwood Dibbles, MD    Allergies    Patient has no known allergies.  Review of Systems   Review of Systems  Neurological: Positive for dizziness.  All other systems reviewed and are negative.   Physical Exam Updated Vital Signs BP 99/70 (BP Location: Right Arm)  Pulse (!) 115   Temp 97.8 F (36.6 C)   Resp (!) 26   SpO2 100%   Physical Exam Vitals and nursing note reviewed.  Constitutional:      General: He is not in acute distress.    Appearance: He is well-developed.     Comments: Patient appears to be in no acute discomfort.  HENT:     Head: Atraumatic.     Mouth/Throat:     Comments: Patient with poor dentition.  Mouth is dry Eyes:     Conjunctiva/sclera: Conjunctivae normal.  Cardiovascular:     Rate and Rhythm: Tachycardia present.     Pulses: Normal pulses.     Heart sounds: Normal heart sounds.  Pulmonary:     Effort:  Pulmonary effort is normal.     Breath sounds: Normal breath sounds. No wheezing, rhonchi or rales.  Abdominal:     General: Abdomen is flat.     Palpations: Abdomen is soft.     Tenderness: There is no abdominal tenderness.  Musculoskeletal:     Cervical back: Normal range of motion and neck supple. No rigidity.     Comments: 5 out of 5 strength all 4 extremities.  Skin:    Findings: No rash.  Neurological:     Mental Status: He is alert and oriented to person, place, and time.     GCS: GCS eye subscore is 4. GCS verbal subscore is 5. GCS motor subscore is 6.  Psychiatric:        Mood and Affect: Mood normal.        Speech: Speech normal.        Behavior: Behavior is cooperative.        Thought Content: Thought content does not include homicidal or suicidal ideation.     ED Results / Procedures / Treatments   Labs (all labs ordered are listed, but only abnormal results are displayed) Labs Reviewed  BASIC METABOLIC PANEL - Abnormal; Notable for the following components:      Result Value   Sodium 134 (*)    Chloride 96 (*)    Creatinine, Ser 1.25 (*)    All other components within normal limits  CBC - Abnormal; Notable for the following components:   Hemoglobin 17.2 (*)    All other components within normal limits  HEPATIC FUNCTION PANEL - Abnormal; Notable for the following components:   Albumin 5.2 (*)    Total Bilirubin 1.4 (*)    Indirect Bilirubin 1.2 (*)    All other components within normal limits  LACTIC ACID, PLASMA - Abnormal; Notable for the following components:   Lactic Acid, Venous 2.1 (*)    All other components within normal limits  TROPONIN I (HIGH SENSITIVITY) - Abnormal; Notable for the following components:   Troponin I (High Sensitivity) 52 (*)    All other components within normal limits  SARS CORONAVIRUS 2 (TAT 6-24 HRS)  TSH  LIPASE, BLOOD  URINALYSIS, ROUTINE W REFLEX MICROSCOPIC  LACTIC ACID, PLASMA  TROPONIN I (HIGH SENSITIVITY)     EKG EKG Interpretation  Date/Time:  Thursday August 17 2020 10:13:01 EDT Ventricular Rate:  108 PR Interval:  96 QRS Duration: 108 QT Interval:  362 QTC Calculation: 485 R Axis:   103 Text Interpretation: Suspect arm lead reversal, interpretation assumes no reversal Sinus tachycardia with short PR Right atrial enlargement Rightward axis Pulmonary disease pattern Minimal voltage criteria for LVH, may be normal variant ( Cornell product ) Nonspecific ST abnormality  Abnormal ECG Confirmed by Gerhard Munch [phone]) on 08/17/2020 12:19:42 PM   Radiology DG Chest 2 View  Result Date: 08/17/2020 CLINICAL DATA:  Shortness of breath EXAM: CHEST - 2 VIEW COMPARISON:  04/03/2020 FINDINGS: The heart size and mediastinal contours are within normal limits. Both lungs are clear. The visualized skeletal structures are unremarkable. IMPRESSION: No acute abnormality of the lungs. Electronically Signed   By: Lauralyn Primes M.D.   On: 08/17/2020 10:46    Procedures Procedures   Medications Ordered in ED Medications  aspirin chewable tablet 324 mg (has no administration in time range)  sodium chloride 0.9 % bolus 1,000 mL (0 mLs Intravenous Stopped 08/17/20 1300)  sodium chloride 0.9 % bolus 1,000 mL (0 mLs Intravenous Stopped 08/17/20 1440)  alum & mag hydroxide-simeth (MAALOX/MYLANTA) 200-200-20 MG/5ML suspension 30 mL (30 mLs Oral Given 08/17/20 1526)    And  lidocaine (XYLOCAINE) 2 % viscous mouth solution 15 mL (15 mLs Oral Given 08/17/20 1526)    ED Course  I have reviewed the triage vital signs and the nursing notes.  Pertinent labs & imaging results that were available during my care of the patient were reviewed by me and considered in my medical decision making (see chart for details).    MDM Rules/Calculators/A&P                          BP 124/90   Pulse 71   Temp 97.8 F (36.6 C)   Resp 15   Ht 5\' 6"  (1.676 m)   Wt 68 kg   SpO2 98%   BMI 24.21 kg/m   Final Clinical  Impression(s) / ED Diagnoses Final diagnoses:  Dehydration  Elevated troponin  Chest pain, unspecified type    Rx / DC Orders ED Discharge Orders    None     11:32 AM Patient report decrease in appetite as well as generalized fatigue and lightheadedness for the past 3 days.  Otherwise he does not complain of any symptoms to suggest an ongoing infection except for some mild subjective fever yesterday.  He was noted to have a blood pressure of 99/70, and tachycardic with a heart rate of 115.  He has normal oral temperature.  He is in no respiratory discomfort.  Initial chest x-ray unremarkable.  Will give IV fluid, work-up initiated  1:37 PM Although patient has a heart score of 2, he does have an elevated delta troponin from 6 to 52.  He does endorse intermittent chest discomfort.  Will consult cardiology for recommendation. Otherwise pt appears to be dehydrated, with labs supporting signs of dehydration.  He is receiving IVF.  Mildly elevated lactic acid like 2/2 dehydration.    1:48 PM Appreciate consultation to cardiology and spoke with cardmaster Trish who will request cardiology team to evaluate pt.   5:37 PM Cardiology team have evaluated pt and will admit for obs.    , PA-C 08/17/20 1804    08/19/20, MD 08/21/20 [phone]

## 2020-08-17 NOTE — ED Triage Notes (Signed)
Pt reports chest pain ,sob, numbness in both hands bilaterally. Pt reports h/o mild MI. Pt reports for the past 3 days he stated having these s/s. Pt looks unwell in triage.

## 2020-08-18 ENCOUNTER — Observation Stay (HOSPITAL_BASED_OUTPATIENT_CLINIC_OR_DEPARTMENT_OTHER): Payer: 59

## 2020-08-18 ENCOUNTER — Observation Stay (HOSPITAL_COMMUNITY): Payer: 59

## 2020-08-18 DIAGNOSIS — R0789 Other chest pain: Secondary | ICD-10-CM

## 2020-08-18 DIAGNOSIS — N179 Acute kidney failure, unspecified: Secondary | ICD-10-CM | POA: Diagnosis not present

## 2020-08-18 DIAGNOSIS — R079 Chest pain, unspecified: Secondary | ICD-10-CM | POA: Diagnosis not present

## 2020-08-18 DIAGNOSIS — F1010 Alcohol abuse, uncomplicated: Secondary | ICD-10-CM | POA: Diagnosis not present

## 2020-08-18 DIAGNOSIS — E860 Dehydration: Secondary | ICD-10-CM | POA: Diagnosis not present

## 2020-08-18 DIAGNOSIS — Z720 Tobacco use: Secondary | ICD-10-CM | POA: Diagnosis not present

## 2020-08-18 DIAGNOSIS — F1410 Cocaine abuse, uncomplicated: Secondary | ICD-10-CM | POA: Diagnosis not present

## 2020-08-18 LAB — COMPREHENSIVE METABOLIC PANEL
ALT: 15 U/L (ref 0–44)
AST: 18 U/L (ref 15–41)
Albumin: 3.5 g/dL (ref 3.5–5.0)
Alkaline Phosphatase: 49 U/L (ref 38–126)
Anion gap: 6 (ref 5–15)
BUN: 16 mg/dL (ref 6–20)
CO2: 28 mmol/L (ref 22–32)
Calcium: 8.8 mg/dL — ABNORMAL LOW (ref 8.9–10.3)
Chloride: 102 mmol/L (ref 98–111)
Creatinine, Ser: 1 mg/dL (ref 0.61–1.24)
GFR, Estimated: >60 mL/min (ref >60)
Glucose, Bld: 92 mg/dL (ref 70–99)
Potassium: 3.8 mmol/L (ref 3.5–5.1)
Sodium: 136 mmol/L (ref 135–145)
Total Bilirubin: 0.7 mg/dL (ref 0.3–1.2)
Total Protein: 5.3 g/dL — ABNORMAL LOW (ref 6.5–8.1)

## 2020-08-18 LAB — CBC
HCT: 40.1 % (ref 39.0–52.0)
Hemoglobin: 14 g/dL (ref 13.0–17.0)
MCH: 31.7 pg (ref 26.0–34.0)
MCHC: 34.9 g/dL (ref 30.0–36.0)
MCV: 90.9 fL (ref 80.0–100.0)
Platelets: 259 10*3/uL (ref 150–400)
RBC: 4.41 MIL/uL (ref 4.22–5.81)
RDW: 12.7 % (ref 11.5–15.5)
WBC: 5.1 10*3/uL (ref 4.0–10.5)
nRBC: 0 % (ref 0.0–0.2)

## 2020-08-18 LAB — RAPID URINE DRUG SCREEN, HOSP PERFORMED
Amphetamines: POSITIVE — AB
Barbiturates: NOT DETECTED
Benzodiazepines: NOT DETECTED
Cocaine: POSITIVE — AB
Opiates: NOT DETECTED
Tetrahydrocannabinol: POSITIVE — AB

## 2020-08-18 LAB — ECHOCARDIOGRAM COMPLETE
AR max vel: 6.02 cm2
AV Area VTI: 5.73 cm2
AV Area mean vel: 5.51 cm2
AV Mean grad: 2 mmHg
AV Peak grad: 4.8 mmHg
Ao pk vel: 1.09 m/s
Area-P 1/2: 4.1 cm2
Height: 66 in
S' Lateral: 2.5 cm
Weight: 2230.4 oz

## 2020-08-18 LAB — LIPID PANEL
Cholesterol: 132 mg/dL (ref 0–200)
HDL: 64 mg/dL (ref >40)
LDL Cholesterol: 53 mg/dL (ref 0–99)
Total CHOL/HDL Ratio: 2.1 RATIO
Triglycerides: 77 mg/dL (ref <150)
VLDL: 15 mg/dL (ref 0–40)

## 2020-08-18 LAB — TROPONIN I (HIGH SENSITIVITY): Troponin I (High Sensitivity): 16 ng/L (ref <18)

## 2020-08-18 LAB — GLUCOSE, CAPILLARY: Glucose-Capillary: 105 mg/dL — ABNORMAL HIGH (ref 70–99)

## 2020-08-18 MED ORDER — MUSCLE RUB 10-15 % EX CREA
TOPICAL_CREAM | CUTANEOUS | Status: DC | PRN
  Filled 2020-08-18: qty 85

## 2020-08-18 MED ORDER — KETOROLAC TROMETHAMINE 10 MG PO TABS
10.0000 mg | ORAL_TABLET | Freq: Once | ORAL | Status: AC
  Administered 2020-08-18: 10 mg via ORAL
  Filled 2020-08-18: qty 1

## 2020-08-18 MED ORDER — NAPROXEN 250 MG PO TABS
500.0000 mg | ORAL_TABLET | Freq: Once | ORAL | Status: AC
  Administered 2020-08-18: 500 mg via ORAL
  Filled 2020-08-18: qty 2

## 2020-08-18 MED ORDER — PERFLUTREN LIPID MICROSPHERE
1.0000 mL | INTRAVENOUS | Status: AC | PRN
  Administered 2020-08-18: 1 mL via INTRAVENOUS
  Filled 2020-08-18: qty 10

## 2020-08-18 NOTE — Progress Notes (Signed)
Patient d/ced, instructions and paperwork given to him by previous nurse. Was awaiting on work excuse letter but had to leave without it. Stated will come back for it later. Assessments remained unchanged now.

## 2020-08-18 NOTE — Discharge Summary (Signed)
Name: Kenneth Bautista MRN: [id number] DOB: 07/02/1985 35 y.o. PCP: Arvilla Market, DO  Date of Admission: 08/17/2020 10:01 AM Date of Discharge: 08/18/20 Attending Physician: Dr. Heide Spark  Discharge Diagnosis: Active Problems:   Atypical chest pain    Discharge Medications: Allergies as of 08/18/2020   No Known Allergies     Medication List    TAKE these medications   albuterol 108 (90 Base) MCG/ACT inhaler Commonly known as: VENTOLIN HFA Inhale 2 puffs into the lungs every 6 (six) hours as needed for wheezing or shortness of breath.   benzonatate 100 MG capsule Commonly known as: TESSALON Take 1 capsule (100 mg total) by mouth 2 (two) times daily as needed for cough.       Disposition and follow-up:   Kenneth Bautista was discharged from [facility] in Good condition.  At the hospital follow up visit please address:  1.  Follow-up:  a.  Atypical chest pain-follow-up cards    b.  Polysubstance abuse-follow-up with primary care provider   c.  Acute kidney injury   2.  Labs / imaging needed at time of follow-up: BMP  3.  Pending labs/ test needing follow-up: None  4.  Medication Changes  Started: None  Stopped: None  Changed: None  Abx -none   Follow-up Appointments:  Follow-up Information    [facility] Follow up.   Why: [phone] Contact information: [address] [place] [address]            Patient instructed up to follow-up with PCP Dr. Earlene Bautista.  TOC order placed to assist patient with this.  Hospital Course by problem list:  Atypical chest pain Patient presented with intermittent episodes of left-sided chest wall pain that has been occurring for the past few months.  Generally wakes him up from his sleep and last about an hour.  He states that this has been worked up in the past and he has been told that nothing was wrong.  On arrival to the ED he was  tachycardic and BP 99/70.  His heart rate improved with IV fluids, this was thought to be secondary to dehydration.  EKG was normal sinus rhythm with no ST wave changes.  Patient does endorse cocaine use yesterday after since pain started and this somewhat helped the pain.  Cardiology was initially consulted, they believed that this pain was not cardiac in nature.  His troponins were flat.  They did perform a echocardiogram that was WNLs.  AKI Patient presented with acute kidney injury, thought to be prerenal in etiology.  Secondary to his dehydration and poor p.o. intake over the past few days.  Patient also with ketones in his urine with a likely starvation ketosis from poor p.o. intake Resolved with IV fluids no further work-up, but will have patient repeat BMP with PCP follow-up.  Substance use disorder Patient with longstanding history of substance abuse.  He states that he uses this to help relieve the pain in his chest.  He uses cocaine as well as marijuana.  Amphetamines were found in his urine, he states that he does not use amphetamines.  We discussed with him that these substances that he buys from the streets can sometimes be laced with certain things, and that this puts him at high risk.  He acknowledges understanding.  Asked if he needed assistance with substance use and he denied.  Discussed that cocaine use can lead to cardiac  problems, such as heart attacks and strokes.  He acknowledges understanding.   Discharge Subjective: Patient resting comfortably in bed.  He still endorses having chest wall pain.  He states that this pain is similar to pain he has had in the past few months.  The pain occurs when he applies pressure of the area.  He states the pain occurs when he lifts boxes at work.  He notes at times the pain becomes so severe that he does have shortness of breath.  Pain is nonradiating.  The pain is severe and is uncontrolled with over-the-counter medications, he will do cocaine  or smoke marijuana.    Patient also states he has not eaten well over the past few weeks.  During his admission he was able to eat half a tray of food and states that is improved from the past few days.    Discharge Exam:   BP 128/79 (BP Location: Left Arm)   Pulse 69   Temp 98.4 F (36.9 C) (Oral)   Resp 18   Ht 5\' 6"  (1.676 m)   Wt 63.2 kg   SpO2 100%   BMI 22.50 kg/m  General: Awake, alert, oriented x3, NAD CVS: Regular rhythm, normal heart sounds Lungs: CTA bilaterally Abdomen: Soft, nontender, nondistended, active bowel sounds Extremities: No edema noted, nontender to palpation Psych: Normal mood and affect HEENT: Normocephalic, atraumatic Skin: Warm and dry Musculoskeletal: Patient with tenderness over left anterior chest wall.  Pertinent Labs, Studies, and Procedures:  CBC Latest Ref Rng & Units 08/18/2020 08/17/2020 04/03/2020  WBC 4.0 - 10.5 K/uL 5.1 9.5 6.0  Hemoglobin 13.0 - 17.0 g/dL 13/06/2019 17.2(H) 14.7  Hematocrit 39.0 - 52.0 % 40.1 48.9 42.8  Platelets 150 - 400 K/uL 259 369 265    CMP Latest Ref Rng & Units 08/18/2020 08/17/2020 04/03/2020  Glucose 70 - 99 mg/dL 92 90 13/06/2019)  BUN 6 - 20 mg/dL 16 16 13   Creatinine 0.61 - 1.24 mg/dL 476(L ) 4.65  Sodium 135 - 145 mmol/L 136 134(L) 136  Potassium 3.5 - 5.1 mmol/L 3.8 3.6 3.3(L)  Chloride 98 - 111 mmol/L 102 96(L) 103  CO2 22 - 32 mmol/L 28 23 24   Calcium 8.9 - 10.3 mg/dL 0.35(W) 9.7 9.2  Total Protein 6.5 - 8.1 g/dL 5.3(L) 8.1 -  Total Bilirubin 0.3 - 1.2 mg/dL 0.7 6.56) -  Alkaline Phos 38 - 126 U/L 49 77 -  AST 15 - 41 U/L 18 26 -  ALT 0 - 44 U/L 15 22 -    DG Chest 2 View  Result Date: 08/17/2020 CLINICAL DATA:  Shortness of breath EXAM: CHEST - 2 VIEW COMPARISON:  04/03/2020 FINDINGS: The heart size and mediastinal contours are within normal limits. Both lungs are clear. The visualized skeletal structures are unremarkable. IMPRESSION: No acute abnormality of the lungs. Electronically Signed   By: 7.5(T M.D.   On: 08/17/2020 10:46     Echo: 1. Left ventricular ejection fraction, by estimation, is 55 to 60%. The  left ventricle has normal function. The left ventricle has no regional  wall motion abnormalities. Left ventricular diastolic parameters were  normal.  2. Right ventricular systolic function is normal. The right ventricular  size is normal. Tricuspid regurgitation signal is inadequate for assessing  PA pressure.  3. The mitral valve is normal in structure. No evidence of mitral valve  regurgitation. No evidence of mitral stenosis.  4. The aortic valve is tricuspid. Aortic valve regurgitation is not  visualized. No aortic stenosis is present.  5. The inferior vena cava is normal in size with greater than 50%  respiratory variability, suggesting right atrial pressure of 3 mmHg.   Discharge Instructions: Discharge Instructions    Call MD for:  difficulty breathing, headache or visual disturbances   Complete by: As directed    Call MD for:  extreme fatigue   Complete by: As directed    Call MD for:  persistant dizziness or light-headedness   Complete by: As directed    Call MD for:  persistant nausea and vomiting   Complete by: As directed    Call MD for:  redness, tenderness, or signs of infection (pain, swelling, redness, odor or green/yellow discharge around incision site)   Complete by: As directed    Call MD for:  severe uncontrolled pain   Complete by: As directed    Call MD for:  temperature >100.4   Complete by: As directed    Diet - low sodium heart healthy   Complete by: As directed    Discharge instructions   Complete by: As directed    Mr. Cappiello thank you for letting us care for you during your admission.  You were worked up for your chest pain.  We do not believe that this is cardiac in etiology, but would like you to follow-up with a cardiologist on an outpatient basis.  For any continued pain please purchase over-the-counter BenGay/IcyHot.  If  you start having shortness of breath with the pain radiates elsewhere, please call [phone].  If you believe you have any medical emergency please go to the closest emergency room.  Please follow-up with primary care provider for repeat labs as well as discussion of her substance use.   Increase activity slowly   Complete by: As directed       Signed: Dellia Cloud, MD 08/18/2020, 6:34 PM   Pager: [phone]

## 2020-08-18 NOTE — TOC Initial Note (Addendum)
Transition of Care [facility]) - Initial/Assessment Note    Patient Details  Name: Kenneth Bautista MRN: [id number] Date of Birth: 1986-02-24  [facility] CM/SW Contact:    Kingsley Plan, RN Phone Number: 08/18/2020, 11:16 AM  Clinical Narrative:                  Patient from home alone. Has family close by.   Patient already has a PCP Dr Earlene Plater. Discussed with patient if he wants to change how he can do so. Patient consented for NCM to schedule appointment with Dr Earlene Plater.Called, received message line busy , to call back . Will attempt later     1210 Called Dr Earlene Plater office was unable to get through , closed for lunch.  1334 No answer for making appointments    Patient Goals and CMS Choice        Expected Discharge Plan and Services                                                Prior Living Arrangements/Services                       Activities of Daily Living Home Assistive Devices/Equipment: None ADL Screening (condition at time of admission) Patient's cognitive ability adequate to safely complete daily activities?: Yes Is the patient deaf or have difficulty hearing?: No Does the patient have difficulty seeing, even when wearing glasses/contacts?: No Does the patient have difficulty concentrating, remembering, or making decisions?: No Patient able to express need for assistance with ADLs?: Yes Does the patient have difficulty dressing or bathing?: No Independently performs ADLs?: Yes (appropriate for developmental age) Does the patient have difficulty walking or climbing stairs?: No Weakness of Legs: None Weakness of Arms/Hands: None  Permission Sought/Granted                  Emotional Assessment              Admission diagnosis:  Dehydration [E86.0] Atypical chest pain [R07.89] Elevated troponin [R77.8] Chest pain, unspecified type [R07.9] Patient Active Problem List   Diagnosis Date Noted  . Atypical chest  pain 08/17/2020  . Tobacco use disorder 02/21/2020  . Asthma, mild intermittent 02/21/2020  . Dental caries 02/21/2020  . Costochondritis 02/21/2020   PCP:  Arvilla Market, DO Pharmacy:   [facility] [address] ([place]), [place] - [address] [place]) [place] [address] Phone: [phone] Fax: [phone]     Social Determinants of Health (SDOH) Interventions    Readmission Risk Interventions No flowsheet data found.

## 2020-08-18 NOTE — Progress Notes (Signed)
Nutrition Brief Note  Patient identified on the Malnutrition Screening Tool (MST) Report.  Wt Readings from Last 15 Encounters:  08/18/20 63.2 kg  06/22/20 65.3 kg  06/19/20 63.5 kg  04/03/20 61.2 kg  02/21/20 61.7 kg  01/01/20 65.8 kg  06/28/19 65.8 kg  07/25/18 65.8 kg  01/17/17 63.5 kg    Body mass index is 22.5 kg/m. Patient meets criteria for normal weight based on current BMI.   Current diet order is regular, patient reports he has been eating well. Labs and medications reviewed.   No nutrition interventions warranted at this time. If nutrition issues arise, please consult RD.   Gabriel Rainwater, RD, LDN, CNSC Please refer to [facility] for contact information.

## 2020-08-18 NOTE — Progress Notes (Addendum)
Progress Note  Patient Name: Kenneth Bautista Date of Encounter: 08/18/2020  [facility] Cardiologist: Christell Constant, MD   Subjective   Pt states he is still having constant chest pain. Upon entering the room, he is resting comfortably. Discussed cocaine and effects on his heart. He states that cocaine helped his chest pain.  Inpatient Medications    Scheduled Meds: . aspirin EC  81 mg Oral Daily  . enoxaparin (LOVENOX) injection  40 mg Subcutaneous Q24H  . lidocaine  1 patch Transdermal Q24H  . nicotine  14 mg Transdermal Daily  . sodium chloride flush  3 mL Intravenous Q12H   Continuous Infusions:  PRN Meds: acetaminophen **OR** acetaminophen, Muscle Rub, promethazine   Vital Signs    Vitals:   08/18/20 0224 08/18/20 0422 08/18/20 0500 08/18/20 0508  BP: 111/74   113/70  Pulse: (!) 58   (!) 57  Resp: 16   17  Temp:  97.9 F (36.6 C)  97.6 F (36.4 C)  TempSrc:  Oral  Oral  SpO2: 100%   100%  Weight:   63.2 kg   Height:        Intake/Output Summary (Last 24 hours) at 08/18/2020 1053 Last data filed at 08/17/2020 1440 Gross per 24 hour  Intake 2000 ml  Output --  Net 2000 ml   Last 3 Weights 08/18/2020 08/17/2020 06/22/2020  Weight (lbs) 139 lb 6.4 oz 150 lb 144 lb  Weight (kg) 63.231 kg 68.04 kg 65.318 kg      Telemetry    Sinus rhythm in the 60s - Personally Reviewed  ECG    No new tracings - Personally Reviewed  Physical Exam   GEN: No acute distress.   Neck: No JVD Cardiac: RRR, no murmurs, rubs, or gallops.  Respiratory: Clear to auscultation bilaterally. GI: Soft, nontender, non-distended  MS: No edema; No deformity. Neuro:  Nonfocal  Psych: Normal affect   Labs    High Sensitivity Troponin:   Recent Labs  Lab 08/17/20 1020 08/17/20 1139 08/17/20 1726 08/18/20 0455  TROPONINIHS 6 52* 58* 16      Chemistry Recent Labs  Lab 08/17/20 1020 08/17/20 1139 08/18/20 0455  NA 134*  --  136  K 3.6  --  3.8  CL 96*  --   102  CO2 23  --  28  GLUCOSE 90  --  92  BUN 16  --  16  CREATININE 1.25*  --  1.00  CALCIUM 9.7  --  8.8*  PROT  --  8.1 5.3*  ALBUMIN  --  5.2* 3.5  AST  --  26 18  ALT  --  22 15  ALKPHOS  --  77 49  BILITOT  --  1.4* 0.7  GFRNONAA >60  --  >60  ANIONGAP 15  --  6     Hematology Recent Labs  Lab 08/17/20 1020 08/18/20 0455  WBC 9.5 5.1  RBC 5.41 4.41  HGB 17.2* 14.0  HCT 48.9 40.1  MCV 90.4 90.9  MCH 31.8 31.7  MCHC 35.2 34.9  RDW 12.8 12.7  PLT 369 259    BNPNo results for input(s): BNP, PROBNP in the last 168 hours.   DDimer No results for input(s): DDIMER in the last 168 hours.   Radiology    DG Chest 2 View  Result Date: 08/17/2020 CLINICAL DATA:  Shortness of breath EXAM: CHEST - 2 VIEW COMPARISON:  04/03/2020 FINDINGS: The heart size and mediastinal contours are  within normal limits. Both lungs are clear. The visualized skeletal structures are unremarkable. IMPRESSION: No acute abnormality of the lungs. Electronically Signed   By: Lauralyn Primes M.D.   On: 08/17/2020 10:46    Cardiac Studies   Echo pending  Patient Profile     35 y.o. male with a hx of asthma, tobacco/THC use, cocaine use, sporadic ETOH use who is being seen for the evaluation of chest pain.  Assessment & Plan    Atypical chest pain Mildly elevated troponin - recent history of cocaine use, UDS positive for cocaine, amphetamines, and THC - hs troponin have been mild and flat - no indication for heparin: 6 --> 52 --> 58 - not consistent with ACS - no BB given recent cocaine use - would complicate use of CT coronary - echo pending - if echo abnormal, will consider inpatient workup, otherwise can discharge home if normal echo   Polysubstance abuse Current smoker - discussed cessation of cigarettes, alcohol, cocaine, and daily THC   Asthma - has home PRN albuterol - states he uses it between zero and 2 times weekly - may be able to consider lexiscan myoview if needed, no  wheezing   Cardiology follow up has been arranged.     For questions or updates, please contact [facility] Please consult [email or web address] for contact info under        Signed, Marcelino Duster, PA  08/18/2020, 10:53 AM    Personally seen and examined. Agree with APP above with the following comments: Briefly 35 yo M with history of substance abuse who presents with chest pain Patient notes that he is still having rib pain, but notes it could be his heart. Exam notable for poor dentition not assessed 08/17/20; otherwise no change in exam Labs notable for minimal elevation in biomarkers Agree with the above; patient notes echo has not been performed yet; if WNL; outpatient primary and cardiology follow is reasonable.  If unable to stay off cocaine, will not be able to do outpatient CCTA.  Riley Lam, MD Cardiologist [facility]  [address] [place], [address] [place], [place] [address] [phone]  2:08 PM

## 2020-08-18 NOTE — Progress Notes (Signed)
Pt need MD note for work, IM medicine paged 6x times no response pt needs before DC.

## 2020-08-18 NOTE — Hospital Course (Addendum)
Kenneth Bautista states that he has had left-sided chest pain that is intermittently associated with movement and lifting of his arms. His pain worsens when he touches his overlying skin. His pain is also intermittently associated with deep breathing, although he notes he has asthma and has a hard time differentiating his symptoms. He says he was feeling light-headed yesterday. He has not been eating over the past 3 days due to decreased appetite. He says his light-headedness improved after eating half his breakfast this morning. Endorses epigastric pain associated with a known hernia, although denies nausea or vomiting. He says he did not have a BM 3 days PTA although has passed his bowels well in the hospital after receiving fluids. He notes he does not have a PCP that he follows up with. He states he has never had an ECHO. He last used cocaine last week and pain seems to trigger his use. He states he doesn't use amphetamines. He smokes daily and uses marijuana and is interested in stopping although notes he has a lot of stressors currently.   Changes - I consulted TOC for PCP needs and substance use counseling  To Do: F/u ECHO

## 2020-08-18 NOTE — Progress Notes (Signed)
I read through patient's chart and noticed a note from his nurse stating that he needed a work note for his recent admission.  Unfortunately, I never received a page on my pager or on the call pager regarding this note.  I spoke with the intern on call and she confirms that she has not received a page in regards to this patient's work note either.  Neither of [name] have received a direct chat message in regard to this note as well.  Nevertheless, a work note was filled out for this patient and available for him at discharge.    Chari Manning, D.O.  Internal Medicine Resident, PGY-2 [facility]  Pager: [phone] 7:27 PM, 08/18/2020

## 2020-08-21 ENCOUNTER — Telehealth: Payer: Self-pay

## 2020-08-21 NOTE — Telephone Encounter (Signed)
Transition Care Management Unsuccessful Follow-up Telephone Call  Date of discharge and from where:  08/18/2020, [facility]   Attempts:  1st Attempt  Reason for unsuccessful TCM follow-up call:  Left voice message on # [phone].   Need to discuss scheduling a hospital follow up appointment with PCP

## 2020-08-22 ENCOUNTER — Telehealth: Payer: Self-pay

## 2020-08-22 NOTE — Telephone Encounter (Signed)
Transition Care Management Follow-up Telephone Call  Date of discharge and from where: 08/18/2020, [facility]   How have you been since you were released from the hospital? He said that he is still having chest pain.  When he is up walking around, he rates the pain 10/10.  He said that it can also wake him up in the middle of the night.  When he sitting more relaxed, he said the pain is still 5/10 and he is trying to manage it.  He describes the pain as sharp, shooting in the middle of his chest.  The same pain he had experienced when he went to the hospital . He denied any nausea, vomiting, dizziness, shortness of breath and said that he has been drinking water/juices  He said that he returned to work, at [employer],  2 days ago but and he had to come home early today because the pain would not subside. This CM discussed returning to the ED and he said he did not need to go now, but understands to go if he feels it is necessary.  Discussed the importance of seeking immediate medical attention for chest pain to prevent possible complications and he said he understands and would go to ED if he felt it was warranted.  Any questions or concerns? Yes - noted above  Items Reviewed:  Did the pt receive and understand the discharge instructions provided? Yes   Medications obtained and verified? he has the albuterol inhaler but could not afford the tessalon.   Other? No   Any new allergies since your discharge? No   Do you have support at home? lives alone but his mother and a friend help with transportation when they are able.   Home Care and Equipment/Supplies: Were home health services ordered? no If so, what is the name of the agency? n/a  Has the agency set up a time to come to the patient's home? not applicable Were any new equipment or medical supplies ordered?  No What is the name of the medical supply agency? n/a Were you able to get the supplies/equipment? not applicable Do you have  any questions related to the use of the equipment or supplies? No  Functional Questionnaire: (I = Independent and D = Dependent) ADLs: independent   Follow up appointments reviewed:   PCP Hospital f/u appt confirmed? Yes  Scheduled to see Ricky Stabs, NP 08/25/2020  Specialist Hospital f/u appt confirmed? Yes  Scheduled to see cardiology - 09/05/2020. .  Are transportation arrangements needed? No   If their condition worsens, is the pt aware to call PCP or go to the Emergency Dept.? Yes  Was the patient provided with contact information for the PCP's office or ED? Yes  Was to pt encouraged to call back with questions or concerns? Yes

## 2020-08-25 ENCOUNTER — Ambulatory Visit (INDEPENDENT_AMBULATORY_CARE_PROVIDER_SITE_OTHER): Payer: 59 | Admitting: Family

## 2020-08-25 ENCOUNTER — Encounter: Payer: Self-pay | Admitting: Family

## 2020-08-25 ENCOUNTER — Other Ambulatory Visit: Payer: Self-pay

## 2020-08-25 VITALS — BP 132/90 | HR 85 | Ht 64.02 in | Wt 134.2 lb

## 2020-08-25 DIAGNOSIS — N179 Acute kidney failure, unspecified: Secondary | ICD-10-CM | POA: Diagnosis not present

## 2020-08-25 DIAGNOSIS — R0789 Other chest pain: Secondary | ICD-10-CM | POA: Diagnosis not present

## 2020-08-25 DIAGNOSIS — F1910 Other psychoactive substance abuse, uncomplicated: Secondary | ICD-10-CM

## 2020-08-25 DIAGNOSIS — Z09 Encounter for follow-up examination after completed treatment for conditions other than malignant neoplasm: Secondary | ICD-10-CM

## 2020-08-25 NOTE — Progress Notes (Addendum)
TRANSITION OF CARE VISIT   Primary Care Physician (PCP): Marcy Siren, DO                                                    [facility]                                                   [address]                                                   [place], [place] [address]                                                   [place]    Date of Admission: 08/17/2020  Date of Discharge: 08/18/2020  Date patient called for Transition of Care Visit: Robyne Peers, RN called patient 08/22/2020  Discharged from: [facility]  Discharge Diagnosis: Atypical chest pain  Summary of Admission:  Atypical chest pain: Patient presented with intermittent episodes of left-sided chest wall pain that has been occurring for the past few months. Generally wakes him up from his sleep and last about an hour.  He states that this has been worked up in the past and he has been told that nothing was wrong.  On arrival to the ED he was tachycardic and BP 99/70.  His heart rate improved with IV fluids, this was thought to be secondary to dehydration.  EKG was normal sinus rhythm with no ST wave changes.  Patient does endorse cocaine use yesterday after since pain started and this somewhat helped the pain.  Cardiology was initially consulted, they believed that this pain was not cardiac in nature.  His troponins were flat.  They did perform a echocardiogram that was WNLs.  AKI: Patient presented with acute kidney injury, thought to be prerenal in etiology.  Secondary to his dehydration and poor p.o. intake over the past few days.  Patient also with ketones in his urine with a likely starvation ketosis from poor p.o. intake Resolved with IV fluids no further work-up, but will have patient repeat BMP with PCP follow-up.  Substance use disorder: Patient with longstanding history of substance abuse.  He states that he uses this to help relieve the pain  in his chest.  He uses cocaine as well as marijuana.  Amphetamines were found in his urine, he states that he does not use amphetamines.  We discussed with him that these substances that he buys from the streets can sometimes be laced with certain things, and that this puts him at high risk.  He acknowledges understanding.  Asked if he needed assistance with substance use and he denied.  Discussed that cocaine use can lead to cardiac problems, such as heart attacks and strokes.  He acknowledges understanding.  Discharge Subjective: Patient resting comfortably in bed.  He still endorses having chest wall  pain.  He states that this pain is similar to pain he has had in the past few months.  The pain occurs when he applies pressure of the area.  He states the pain occurs when he lifts boxes at work.  He notes at times the pain becomes so severe that he does have shortness of breath.  Pain is nonradiating. The pain is severe and is uncontrolled with over-the-counter medications, he will do cocaine or smoke marijuana.  Patient also states he has not eaten well over the past few weeks.  During his admission he was able to eat half a tray of food and states that is improved from the past few days.    Discharge Instructions: Mr. Hilbert thank you for letting us care for you during your admission.  You were worked up for your chest pain.  We do not believe that this is cardiac in etiology, but would like you to follow-up with a cardiologist on an outpatient basis.  For any continued pain please purchase over-the-counter BenGay/IcyHot.  If you start having shortness of breath with the pain radiates elsewhere, please call 911.  If you believe you have any medical emergency please go to the closest emergency room.  Please follow-up with primary care provider for repeat labs as well as discussion of her substance use.   TODAY's VISIT Pain has improved, last night was bad with chest pain. At that time he took 5  Ibuprofen which didn't help. Has worked the past 2 days at a [employer]. Reports he is not using illicit substances. States when he was using cocaine it was to make him forget about the chest pain. Denies referral for polysubstance use disorder. Consuming alcohol once every 2 weeks Time since discharge: 7 days Diagnosis: Atypical chest pain Consultants: none New medications: none Discharge instructions: Follow-up with PCP and Cardiology Status: Reports feeling about the same since hospital discharge.  Patient/Caregiver self-reported problems/concerns: None  MEDICATIONS  Medication Reconciliation conducted with patient/caregiver? (Yes/ No): Yes  New medications prescribed/discontinued upon discharge? (Yes/No): None  Barriers identified related to medications: No  LABS  Lab Reviewed (Yes/No/NA): Yes  PHYSICAL EXAM:  General appearance - alert, well appearing, and in no distress, oriented to person, place, and time and normal appearing weight Mental status - alert, oriented to person, place, and time, normal mood, behavior, speech, dress, motor activity, and thought processes Eyes - pupils equal and reactive, extraocular eye movements intact Neck - supple, no significant adenopathy Chest - clear to auscultation, no wheezes, rales or rhonchi, symmetric air entry, no tachypnea, retractions or cyanosis Heart - normal rate, regular rhythm, normal S1, S2, no murmurs, rubs, clicks or gallops Neurological - alert, oriented, normal speech, no focal findings or movement disorder noted  ASSESSMENT: 1. Hospital discharge follow-up: - Today reports feeling about the same since hospital discharge.   2. Atypical chest pain: - Stable during today's office visit. - Referral to Cardiology for further evaluation and management.  - Ambulatory referral to Cardiology  3. AKI (acute kidney injury) (HCC): - Repeat BMP to check kidney function and electrolyte balance.  - Basic Metabolic  Panel  4. Polysubstance abuse [facility]): - Patient declined referral.    PATIENT EDUCATION PROVIDED: See AVS   FOLLOW-UP (Include any further testing or referrals): Referral to Cardiology

## 2020-08-25 NOTE — Progress Notes (Signed)
HFU- experiencing sharp shooting chest pain

## 2020-08-25 NOTE — Patient Instructions (Signed)
   Keep appointment with Cardiology.   Lab today.   Follow-up with primary provider as scheduled.  Chest Wall Pain Chest wall pain is pain in or around the bones and muscles of your chest. Chest wall pain may be caused by:  An injury.  Coughing a lot.  Using your chest and arm muscles too much. Sometimes, the cause may not be known. This pain may take a few weeks or longer to get better. Follow these instructions at home: Managing pain, stiffness, and swelling If told, put ice on the painful area:  Put ice in a plastic bag.  Place a towel between your skin and the bag.  Leave the ice on for 20 minutes, 2-3 times a day.   Activity  Rest as told by your doctor.  Avoid doing things that cause pain. This includes lifting heavy items.  Ask your doctor what activities are safe for you. General instructions  Take over-the-counter and prescription medicines only as told by your doctor.  Do not use any products that contain nicotine or tobacco, such as cigarettes, e-cigarettes, and chewing tobacco. If you need help quitting, ask your doctor.  Keep all follow-up visits as told by your doctor. This is important.   Contact a doctor if:  You have a fever.  Your chest pain gets worse.  You have new symptoms. Get help right away if:  You feel sick to your stomach (nauseous) or you throw up (vomit).  You feel sweaty or light-headed.  You have a cough with mucus from your lungs (sputum) or you cough up blood.  You are short of breath. These symptoms may be an emergency. Do not wait to see if the symptoms will go away. Get medical help right away. Call your local emergency services (911 in the [place] Do not drive yourself to the hospital. Summary  Chest wall pain is pain in or around the bones and muscles of your chest.  It may be treated with ice, rest, and medicines. Your condition may also get better if you avoid doing things that cause pain.  Contact a doctor if you have  a fever, chest pain that gets worse, or new symptoms.  Get help right away if you feel light-headed or you get short of breath. These symptoms may be an emergency. This information is not intended to replace advice given to you by your health care provider. Make sure you discuss any questions you have with your health care provider. Document Revised: 11/20/2017 Document Reviewed: 11/20/2017 Elsevier Patient Education  2021 [employer].

## 2020-08-26 ENCOUNTER — Emergency Department (HOSPITAL_COMMUNITY)
Admission: EM | Admit: 2020-08-26 | Discharge: 2020-08-26 | Disposition: A | Discharge status: Home / Self Care | Payer: 59 | Attending: Emergency Medicine | Admitting: Emergency Medicine

## 2020-08-26 ENCOUNTER — Emergency Department (HOSPITAL_COMMUNITY): Payer: 59

## 2020-08-26 ENCOUNTER — Encounter (HOSPITAL_COMMUNITY): Payer: Self-pay | Admitting: Emergency Medicine

## 2020-08-26 DIAGNOSIS — R0602 Shortness of breath: Secondary | ICD-10-CM | POA: Insufficient documentation

## 2020-08-26 DIAGNOSIS — R0789 Other chest pain: Secondary | ICD-10-CM

## 2020-08-26 DIAGNOSIS — J4520 Mild intermittent asthma, uncomplicated: Secondary | ICD-10-CM | POA: Insufficient documentation

## 2020-08-26 DIAGNOSIS — F17200 Nicotine dependence, unspecified, uncomplicated: Secondary | ICD-10-CM | POA: Insufficient documentation

## 2020-08-26 LAB — BASIC METABOLIC PANEL
Anion gap: 6 (ref 5–15)
BUN/Creatinine Ratio: 17 (ref 9–20)
BUN: 14 mg/dL (ref 6–20)
BUN: 11 mg/dL (ref 6–20)
CO2: 19 mmol/L — ABNORMAL LOW (ref 20–29)
CO2: 26 mmol/L (ref 22–32)
Calcium: 9.8 mg/dL (ref 8.7–10.2)
Calcium: 9.4 mg/dL (ref 8.9–10.3)
Chloride: 98 mmol/L (ref 96–106)
Chloride: 101 mmol/L (ref 98–111)
Creatinine, Ser: 0.81 mg/dL (ref 0.76–1.27)
Creatinine, Ser: 1.04 mg/dL (ref 0.61–1.24)
GFR, Estimated: >60 mL/min (ref >60)
Glucose, Bld: 92 mg/dL (ref 70–99)
Glucose: 81 mg/dL (ref 65–99)
Potassium: 4.2 mmol/L (ref 3.5–5.2)
Potassium: 3.8 mmol/L (ref 3.5–5.1)
Sodium: 137 mmol/L (ref 134–144)
Sodium: 133 mmol/L — ABNORMAL LOW (ref 135–145)
eGFR: 118 mL/min/{1.73_m2} (ref >59)

## 2020-08-26 LAB — HEPATIC FUNCTION PANEL
ALT: 18 U/L (ref 0–44)
AST: 21 U/L (ref 15–41)
Albumin: 4.1 g/dL (ref 3.5–5.0)
Alkaline Phosphatase: 62 U/L (ref 38–126)
Bilirubin, Direct: <0.1 mg/dL (ref 0.0–0.2)
Total Bilirubin: 0.7 mg/dL (ref 0.3–1.2)
Total Protein: 6.4 g/dL — ABNORMAL LOW (ref 6.5–8.1)

## 2020-08-26 LAB — CBC
HCT: 44 % (ref 39.0–52.0)
Hemoglobin: 14.9 g/dL (ref 13.0–17.0)
MCH: 31.1 pg (ref 26.0–34.0)
MCHC: 33.9 g/dL (ref 30.0–36.0)
MCV: 91.9 fL (ref 80.0–100.0)
Platelets: 332 10*3/uL (ref 150–400)
RBC: 4.79 MIL/uL (ref 4.22–5.81)
RDW: 12.5 % (ref 11.5–15.5)
WBC: 6.4 10*3/uL (ref 4.0–10.5)
nRBC: 0 % (ref 0.0–0.2)

## 2020-08-26 LAB — TROPONIN I (HIGH SENSITIVITY)
Troponin I (High Sensitivity): 3 ng/L (ref <18)
Troponin I (High Sensitivity): 3 ng/L (ref <18)

## 2020-08-26 MED ORDER — PANTOPRAZOLE SODIUM 20 MG PO TBEC: 20.0000 mg | DELAYED_RELEASE_TABLET | Freq: Every day | ORAL | 0 refills | Status: DC

## 2020-08-26 MED ORDER — METHYLPREDNISOLONE SODIUM SUCC 125 MG IJ SOLR
125.0000 mg | Freq: Once | INTRAMUSCULAR | Status: AC
  Administered 2020-08-26: 125 mg via INTRAVENOUS
  Filled 2020-08-26: qty 2

## 2020-08-26 MED ORDER — ALUM & MAG HYDROXIDE-SIMETH 200-200-20 MG/5ML PO SUSP
30.0000 mL | Freq: Once | ORAL | Status: AC
  Administered 2020-08-26: 30 mL via ORAL
  Filled 2020-08-26: qty 30

## 2020-08-26 MED ORDER — IPRATROPIUM-ALBUTEROL 0.5-2.5 (3) MG/3ML IN SOLN
3.0000 mL | Freq: Once | RESPIRATORY_TRACT | Status: AC
  Administered 2020-08-26: 3 mL via RESPIRATORY_TRACT
  Filled 2020-08-26: qty 3

## 2020-08-26 NOTE — ED Provider Notes (Signed)
[facility] EMERGENCY DEPARTMENT Provider Note   CSN: [id number] Arrival date & time: 08/26/20  1206     History No chief complaint on file.   Kenneth Bautista is a 35 y.o. male.  Pt presents to the ED today with cp and sob.  Pt has had the cp for months.  He was admitted from 3/17-18 for atypical cp and AKI.  He said his cp never went away.  Pt does have a hx of drug and alcohol abuse, but denies any use since he was d/c.  Pt does continue to smoke cigarettes.  He has been using his inhaler without any improvement.        Past Medical History:  Diagnosis Date  . Arthritis   . Asthma     Patient Active Problem List   Diagnosis Date Noted  . Atypical chest pain 08/17/2020  . Tobacco use disorder 02/21/2020  . Asthma, mild intermittent 02/21/2020  . Dental caries 02/21/2020  . Costochondritis 02/21/2020    Past Surgical History:  Procedure Laterality Date  . None         Family History  Problem Relation Age of Onset  . Heart disease Mother        details unknown  . Heart disease Father        details unknown    Social History   Tobacco Use  . Smoking status: Current Every Day Smoker  . Smokeless tobacco: Never Used  . Tobacco comment: since age 42  Vaping Use  . Vaping Use: Never used  Substance Use Topics  . Alcohol use: Yes    Comment: 1/5th of gin every 1-2 weeks  . Drug use: Yes    Types: Marijuana, Cocaine    Home Medications Prior to Admission medications   Medication Sig Start Date End Date Taking? Authorizing Provider  albuterol (VENTOLIN HFA) 108 (90 Base) MCG/ACT inhaler Inhale 2 puffs into the lungs every 6 (six) hours as needed for wheezing or shortness of breath. 01/27/20  Yes Arvilla Market, DO  IBUPROFEN PO Take 400 mg by mouth every 6 (six) hours as needed (pain).   Yes [provider]  pantoprazole (PROTONIX) 20 MG tablet Take 1 tablet (20 mg total) by mouth daily. 08/26/20  Yes Jacalyn Lefevre, MD     Allergies    Patient has no known allergies.  Review of Systems   Review of Systems  Respiratory: Positive for shortness of breath.   Cardiovascular: Positive for chest pain.  All other systems reviewed and are negative.   Physical Exam Updated Vital Signs BP 122/83   Pulse 86   Temp (!) 97.5 F (36.4 C) (Oral)   Resp 14   SpO2 100%   Physical Exam Vitals and nursing note reviewed.  Constitutional:      Appearance: Normal appearance.  HENT:     Head: Normocephalic and atraumatic.     Right Ear: External ear normal.     Left Ear: External ear normal.     Nose: Nose normal.     Mouth/Throat:     Mouth: Mucous membranes are moist.     Pharynx: Oropharynx is clear.  Eyes:     Extraocular Movements: Extraocular movements intact.     Conjunctiva/sclera: Conjunctivae normal.     Pupils: Pupils are equal, round, and reactive to light.  Cardiovascular:     Rate and Rhythm: Normal rate and regular rhythm.     Pulses: Normal pulses.  Heart sounds: Normal heart sounds.  Pulmonary:     Effort: Pulmonary effort is normal.     Breath sounds: Normal breath sounds.  Abdominal:     General: Abdomen is flat. Bowel sounds are normal.     Palpations: Abdomen is soft.  Musculoskeletal:        General: Normal range of motion.     Cervical back: Normal range of motion and neck supple.  Skin:    General: Skin is warm.     Capillary Refill: Capillary refill takes less than 2 seconds.  Neurological:     General: No focal deficit present.     Mental Status: He is alert and oriented to person, place, and time.  Psychiatric:        Mood and Affect: Mood normal.        Behavior: Behavior normal.        Thought Content: Thought content normal.        Judgment: Judgment normal.     ED Results / Procedures / Treatments   Labs (all labs ordered are listed, but only abnormal results are displayed) Labs Reviewed  BASIC METABOLIC PANEL - Abnormal; Notable for the following  components:      Result Value   Sodium 133 (*)    All other components within normal limits  HEPATIC FUNCTION PANEL - Abnormal; Notable for the following components:   Total Protein 6.4 (*)    All other components within normal limits  CBC  RAPID URINE DRUG SCREEN, HOSP PERFORMED  TROPONIN I (HIGH SENSITIVITY)  TROPONIN I (HIGH SENSITIVITY)    EKG EKG Interpretation  Date/Time:  Saturday August 26 2020 12:15:46 EDT Ventricular Rate:  71 PR Interval:  118 QRS Duration: 120 QT Interval:  404 QTC Calculation: 439 R Axis:   86 Text Interpretation: Normal sinus rhythm with sinus arrhythmia Non-specific intra-ventricular conduction delay Borderline ECG No significant change since last tracing Confirmed by Jacalyn Lefevre [phone]) on 08/26/2020 3:15:00 PM   Radiology DG Chest 2 View  Result Date: 08/26/2020 CLINICAL DATA:  Intermittent chest pain EXAM: CHEST - 2 VIEW COMPARISON:  08/17/2020 FINDINGS: The heart size and mediastinal contours are within normal limits. 6 mm nodular density projects over the peripheral aspect of the right lower lobe which may reflect a nipple shadow. Lungs appear otherwise clear. No pleural effusion or pneumothorax. The visualized skeletal structures are unremarkable. IMPRESSION: 1. No active cardiopulmonary disease. 2. 6 mm nodular density projects over the peripheral aspect of the right lower lobe which may reflect a nipple shadow. Repeat radiographs with nipple markers could be performed to confirm. Electronically Signed   By: Duanne Guess D.O.   On: 08/26/2020 13:10    Procedures Procedures   Medications Ordered in ED Medications  methylPREDNISolone sodium succinate (SOLU-MEDROL) 125 mg/2 mL injection 125 mg (125 mg Intravenous Given 08/26/20 1334)  ipratropium-albuterol (DUONEB) 0.5-2.5 (3) MG/3ML nebulizer solution 3 mL (3 mLs Nebulization Given 08/26/20 1338)  alum & mag hydroxide-simeth (MAALOX/MYLANTA) 200-200-20 MG/5ML suspension 30 mL (30 mLs  Oral Given 08/26/20 1333)    ED Course  I have reviewed the triage vital signs and the nursing notes.  Pertinent labs & imaging results that were available during my care of the patient were reviewed by me and considered in my medical decision making (see chart for details).    MDM Rules/Calculators/A&P  I suspect pt's CP is likely GI as he's had sx for months.  Cardiac work up is negative.  Pt is encouraged to continue to avoid alcohol and cocaine.  He is to return if worse.  F/u with pcp.  Final Clinical Impression(s) / ED Diagnoses Final diagnoses:  Atypical chest pain    Rx / DC Orders ED Discharge Orders         Ordered    pantoprazole (PROTONIX) 20 MG tablet  Daily        08/26/20 1529           Jacalyn Lefevre, MD 08/26/20 1530

## 2020-08-26 NOTE — Discharge Instructions (Signed)
Continue to avoid alcohol and cocaine.  Try to stop smoking tobacco.

## 2020-08-26 NOTE — ED Triage Notes (Signed)
Pt reports intermittent chest pain for a few months with SOB.  Admitted 3/17 for same.

## 2020-08-27 NOTE — Progress Notes (Signed)
Kidney function normal

## 2020-09-05 ENCOUNTER — Encounter: Payer: Self-pay | Admitting: Internal Medicine

## 2020-09-05 ENCOUNTER — Ambulatory Visit (INDEPENDENT_AMBULATORY_CARE_PROVIDER_SITE_OTHER): Payer: 59 | Admitting: Internal Medicine

## 2020-09-05 ENCOUNTER — Other Ambulatory Visit: Payer: Self-pay

## 2020-09-05 VITALS — BP 110/68 | HR 82 | Ht 66.0 in | Wt 136.6 lb

## 2020-09-05 DIAGNOSIS — F1010 Alcohol abuse, uncomplicated: Secondary | ICD-10-CM | POA: Insufficient documentation

## 2020-09-05 DIAGNOSIS — Z720 Tobacco use: Secondary | ICD-10-CM | POA: Diagnosis not present

## 2020-09-05 DIAGNOSIS — K219 Gastro-esophageal reflux disease without esophagitis: Secondary | ICD-10-CM | POA: Diagnosis not present

## 2020-09-05 DIAGNOSIS — F1410 Cocaine abuse, uncomplicated: Secondary | ICD-10-CM

## 2020-09-05 MED ORDER — FAMOTIDINE 20 MG PO TABS: 20.0000 mg | ORAL_TABLET | Freq: Every day | ORAL | 3 refills | Status: DC

## 2020-09-05 NOTE — Patient Instructions (Signed)
Medication Instructions:  Your physician has recommended you make the following change in your medication:  STOP: pantoprazole START: famotidine (Pepcid) 20 mg by mouth daily  *If you need a refill on your cardiac medications before your next appointment, please call your pharmacy*   Lab Work: NONE If you have labs (blood work) drawn today and your tests are completely normal, you will receive your results only by: Marland Kitchen MyChart Message (if you have MyChart) OR . A paper copy in the mail If you have any lab test that is abnormal or we need to change your treatment, we will call you to review the results.   Testing/Procedures: NONE   Follow-Up: As needed At [facility], you and your health needs are our priority.  As part of our continuing mission to provide you with exceptional heart care, we have created designated Provider Care Teams.  These Care Teams include your primary Cardiologist (physician) and Advanced Practice Providers (APPs -  Physician Assistants and Nurse Practitioners) who all work together to provide you with the care you need, when you need it.

## 2020-09-05 NOTE — Progress Notes (Signed)
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 male with a hx of arthritis, Asthma, Recent Cocaine Abuse, Recent urine tox positive for Amphetamines, Tobacco Abuse, and alcohol abuse.  Initially presented 08/17/20 with CP.  Normal Echo at that time.  Seen 08/26/20 with similar presentation, but amphetamines in testing, thought patient denies [name]. Presenting 09/05/20.  Patient notes that he is doing OK.  Since last evaluation notes other ED visit.  Notes that he smoked two days ago unable to clarify substance) but has been drug free since.  His mother is watching over his finances and only gives him a small amount of money so he won't overspend.  Since his DUI, he has cut down on his drinking significantly.  Notes that he is down to 1/2 pack a day..  Relevant interval testing or therapy include novel troponin < 18 in last ED visit.Marland Kitchen   No chest pain or pressure .  No SOB/DOE and no PND/Orthopnea.  No weight gain or leg swelling.  No palpitations or syncope.  Notes that his reflux medication gives him significant diarrhea and would like to try a different medication.   Past Medical History:  Diagnosis Date  . Arthritis   . Asthma     Past Surgical History:  Procedure Laterality Date  . None      Current Medications: Current Meds  Medication Sig  . albuterol (VENTOLIN HFA) 108 (90 Base) MCG/ACT inhaler Inhale 2 puffs into the lungs every  6 (six) hours as needed for wheezing or shortness of breath.  . famotidine (PEPCID) 20 MG tablet Take 1 tablet (20 mg total) by mouth daily.  . IBUPROFEN PO Take 400 mg by mouth every 6 (six) hours as needed (pain).  . [DISCONTINUED] pantoprazole (PROTONIX) 20 MG tablet Take 1 tablet (20 mg total) by mouth daily.     Allergies:   Patient has no known allergies.   Social History   Socioeconomic History  . Marital status: Single    Spouse name: Not on file  . Number of children: Not on file  . Years of education: Not on file  . Highest education level: Not on file  Occupational History  . Not on file  Tobacco Use  . Smoking status: Current Every Day Smoker  . Smokeless tobacco: Never Used  . Tobacco comment: since age 85  Vaping Use  . Vaping Use: Never used  Substance and Sexual Activity  . Alcohol use: Yes    Comment: 1/5th of gin every 1-2 weeks  . Drug use: Yes    Types: Marijuana, Cocaine  . Sexual activity: Not on file  Other Topics Concern  . Not on file  Social History Narrative  . Not on file   Social Determinants of Health   Financial Resource Strain: Not on file  Food Insecurity: Not on file  Transportation Needs: Not on file  Physical Activity: Not on file  Stress: Not on file  Social Connections: Not on file     Family History: The patient's family history includes Heart disease in his father and mother.  ROS:   Please see the history of present illness.     All other systems reviewed and are negative.  EKGs/Labs/Other Studies Reviewed:    The following studies were reviewed today:   EKG:   08/28/20:  Sinus arrhythmia 71, borderline IVCD  Transthoracic Echocardiogram: Date: 08/18/20 Results: IMPRESSIONS  1. Left ventricular ejection fraction, by estimation, is 55 to 60%. The  left ventricle has normal function. The left ventricle has no regional  wall motion abnormalities. Left ventricular diastolic parameters were  normal.  2. Right  ventricular systolic function is normal. The right ventricular  size is normal. Tricuspid regurgitation signal is inadequate for assessing  PA pressure.  3. The mitral valve is normal in structure. No evidence of mitral valve  regurgitation. No evidence of mitral stenosis.  4. The aortic valve is tricuspid. Aortic valve regurgitation is not  visualized. No aortic stenosis is present.  5. The inferior vena cava is normal in size with greater than 50%  respiratory variability, suggesting right atrial pressure of 3 mmHg.   Recent Labs: 08/17/2020: TSH 1.801 08/26/2020: ALT 18; BUN 11; Creatinine, Ser 1.04; Hemoglobin 14.9; Platelets 332; Potassium 3.8; Sodium 133  Recent Lipid Panel    Component Value Date/Time   CHOL 132 08/18/2020 0455   TRIG 77 08/18/2020 0455   HDL 64 08/18/2020 0455   CHOLHDL 2.1 08/18/2020 0455   VLDL 15 08/18/2020 0455   LDLCALC 53 08/18/2020 0455     Risk Assessment/Calculations:     N/A  Physical Exam:    VS:  BP 110/68   Pulse 82   Ht 5\' 6"  (1.676 m)   Wt 136 lb 9.6 oz (62 kg)   SpO2 99%   BMI 22.05 kg/m     Wt Readings from Last 3 Encounters:  09/05/20 136 lb 9.6 oz (62 kg)  08/25/20 134 lb 3.2 oz (60.9 kg)  08/18/20 139 lb 6.4 oz (63.2 kg)     GEN: Well developed in no acute distress, smells of smoke HEENT: Poor Dentition NECK: No JVD; No carotid bruits LYMPHATICS: No lymphadenopathy CARDIAC: RRR, no murmurs, rubs, gallops RESPIRATORY:  Clear to auscultation without rales, wheezing or rhonchi  ABDOMEN: Soft, non-tender, non-distended MUSCULOSKELETAL:  No edema; No deformity  SKIN: Warm and dry NEUROLOGIC:  Alert and oriented x 3 PSYCHIATRIC:  Normal affect   ASSESSMENT:    1. Cocaine abuse (HCC)   2. Tobacco abuse   3. Gastroesophageal reflux disease without esophagitis   4. Alcohol abuse    PLAN:    In order of problems listed above:  Polysubstance Abuse- Cocaine, Alcohol, Tobacco, amphetamines GERD Hx of Cocaine  Associated vasospasm - will stop PPI and trial H2 blocker - discussed at length how tobacco, cocaine, alcohol, and amphetamines lead to heart disease - offered referral to psych or addiction medicine; deferred at this time  PRN follow up unless new symptoms or abnormal test results warranting change in plan:  When substance free and testing negative, would consider future ischemic testing (CCTA)  Would be reasonable for  APP Follow up     Medication Adjustments/Labs and Tests Ordered: Current medicines are reviewed at length with the patient today.  Concerns regarding medicines are outlined above.  No orders of the defined types were placed in this encounter.  Meds ordered this encounter  Medications  . famotidine (PEPCID) 20 MG tablet    Sig: Take 1 tablet (20 mg total) by mouth daily.    Dispense:  90 tablet    Refill:  3    Patient Instructions  Medication Instructions:  Your physician has recommended you make the following change in your medication:  STOP: pantoprazole START: famotidine (Pepcid) 20 mg by mouth daily  *If you need a refill on your cardiac medications before your next appointment, please call your pharmacy*   Lab Work: NONE If you have labs (blood work) drawn today and your tests are completely normal, you will receive your results only by: Marland Kitchen MyChart Message (if you have MyChart) OR . A paper copy in the mail If you have any lab test that is abnormal or we need to change your treatment, we will call you to review the results.   Testing/Procedures: NONE   Follow-Up: As needed At [facility], you and your health needs are our priority.  As part of our continuing mission to provide you with exceptional heart care, we have created designated Provider Care Teams.  These Care Teams include your primary Cardiologist (physician) and Advanced Practice Providers (APPs -  Physician Assistants and Nurse Practitioners) who all work together to provide you with  the care you need, when you need it.            Signed, Christell Constant, MD  09/05/2020 5:36 PM    [facility]

## 2020-10-02 ENCOUNTER — Telehealth: Payer: Self-pay | Admitting: Internal Medicine

## 2020-10-02 NOTE — Telephone Encounter (Signed)
Pt called stating he received a letter from the hospital about a chest xray he had on 3.26.22. Wants to know if he needs to make an appointment and what was found on the chest xray. Please call pt and advise.

## 2020-10-04 ENCOUNTER — Other Ambulatory Visit: Payer: Self-pay

## 2020-10-04 ENCOUNTER — Encounter (HOSPITAL_BASED_OUTPATIENT_CLINIC_OR_DEPARTMENT_OTHER): Payer: Self-pay

## 2020-10-04 ENCOUNTER — Emergency Department (HOSPITAL_BASED_OUTPATIENT_CLINIC_OR_DEPARTMENT_OTHER)
Admission: EM | Admit: 2020-10-04 | Discharge: 2020-10-04 | Discharge status: Against Medical Advice | Payer: 59 | Attending: Emergency Medicine | Admitting: Emergency Medicine

## 2020-10-04 DIAGNOSIS — F17200 Nicotine dependence, unspecified, uncomplicated: Secondary | ICD-10-CM | POA: Insufficient documentation

## 2020-10-04 DIAGNOSIS — R0789 Other chest pain: Secondary | ICD-10-CM | POA: Diagnosis not present

## 2020-10-04 DIAGNOSIS — J45909 Unspecified asthma, uncomplicated: Secondary | ICD-10-CM | POA: Insufficient documentation

## 2020-10-04 DIAGNOSIS — Z5321 Procedure and treatment not carried out due to patient leaving prior to being seen by health care provider: Secondary | ICD-10-CM | POA: Diagnosis not present

## 2020-10-04 DIAGNOSIS — R42 Dizziness and giddiness: Secondary | ICD-10-CM | POA: Diagnosis not present

## 2020-10-04 DIAGNOSIS — R079 Chest pain, unspecified: Secondary | ICD-10-CM | POA: Diagnosis present

## 2020-10-04 HISTORY — DX: Other psychoactive substance abuse, uncomplicated: F19.10

## 2020-10-04 NOTE — ED Provider Notes (Signed)
[facility] EMERGENCY DEPT Provider Note   CSN: [id number] Arrival date & time: 10/04/20  2219     History Chief Complaint  Patient presents with  . Dizziness  . Chest Pain    Kenneth Bautista is a 35 y.o. male.  Patient presents to the emergency department with chest pain and dizziness.  Patient reports that he has been experiencing frequent chest pain for more than a month.  Record review reveals patient was admitted to the hospital on March 17 for this chest pain and at that time he reported that the pain was present for "months".  Patient reports pain comes and goes.  He woke up with pain this morning and reports that when he went to work and started lifting heavy objects the pain worsened.  Patient also complaining of dizziness.  He reports that he has been feeling dizzy, nonvertiginous, no headache.  This has been ongoing for a week.        Past Medical History:  Diagnosis Date  . Arthritis   . Asthma   . Substance abuse [facility])     Patient Active Problem List   Diagnosis Date Noted  . Cocaine abuse (HCC) 09/05/2020  . Gastroesophageal reflux disease without esophagitis 09/05/2020  . Alcohol abuse 09/05/2020  . Atypical chest pain 08/17/2020  . Tobacco abuse 02/21/2020  . Asthma, mild intermittent 02/21/2020  . Dental caries 02/21/2020  . Costochondritis 02/21/2020    Past Surgical History:  Procedure Laterality Date  . None         Family History  Problem Relation Age of Onset  . Heart disease Mother        details unknown  . Heart disease Father        details unknown    Social History   Tobacco Use  . Smoking status: Current Every Day Smoker  . Smokeless tobacco: Never Used  . Tobacco comment: since age 60  Vaping Use  . Vaping Use: Never used  Substance Use Topics  . Alcohol use: Yes    Comment: 1/5th of gin every 1-2 weeks  . Drug use: Yes    Types: Marijuana, Cocaine    Comment: last cocatin use 09/2020    Home  Medications Prior to Admission medications   Medication Sig Start Date End Date Taking? Authorizing Provider  albuterol (VENTOLIN HFA) 108 (90 Base) MCG/ACT inhaler Inhale 2 puffs into the lungs every 6 (six) hours as needed for wheezing or shortness of breath. 01/27/20   Arvilla Market, DO  famotidine (PEPCID) 20 MG tablet Take 1 tablet (20 mg total) by mouth daily. 09/05/20   Riley Lam A, MD  IBUPROFEN PO Take 400 mg by mouth every 6 (six) hours as needed (pain).    [provider]    Allergies    Patient has no known allergies.  Review of Systems   Review of Systems  Cardiovascular: Positive for chest pain.  Neurological: Positive for dizziness.  All other systems reviewed and are negative.   Physical Exam Updated Vital Signs BP 126/84   Pulse (!) 57   Temp 97.9 F (36.6 C) (Oral)   Resp 12   Ht 5\' 6"  (1.676 m)   Wt 56.7 kg   SpO2 99%   BMI 20.18 kg/m   Physical Exam Vitals and nursing note reviewed.  Constitutional:      General: He is not in acute distress.    Appearance: Normal appearance. He is well-developed.  HENT:  Head: Normocephalic and atraumatic.     Right Ear: Hearing normal.     Left Ear: Hearing normal.     Nose: Nose normal.  Eyes:     Conjunctiva/sclera: Conjunctivae normal.     Pupils: Pupils are equal, round, and reactive to light.  Cardiovascular:     Rate and Rhythm: Regular rhythm.     Heart sounds: S1 normal and S2 normal. No murmur heard. No friction rub. No gallop.   Pulmonary:     Effort: Pulmonary effort is normal. No respiratory distress.     Breath sounds: Normal breath sounds.  Chest:     Chest wall: No tenderness.  Abdominal:     General: Bowel sounds are normal.     Palpations: Abdomen is soft.     Tenderness: There is no abdominal tenderness. There is no guarding or rebound. Negative signs include Murphy's sign and McBurney's sign.     Hernia: No hernia is present.  Musculoskeletal:         General: Normal range of motion.     Cervical back: Normal range of motion and neck supple.  Skin:    General: Skin is warm and dry.     Findings: No rash.  Neurological:     Mental Status: He is alert and oriented to person, place, and time.     GCS: GCS eye subscore is 4. GCS verbal subscore is 5. GCS motor subscore is 6.     Cranial Nerves: No cranial nerve deficit.     Sensory: No sensory deficit.     Coordination: Coordination normal.  Psychiatric:        Speech: Speech normal.        Behavior: Behavior normal.        Thought Content: Thought content normal.     ED Results / Procedures / Treatments   Labs (all labs ordered are listed, but only abnormal results are displayed) Labs Reviewed - No data to display  EKG EKG Interpretation  Date/Time:  Wednesday Oct 04 2020 22:22:16 EDT Ventricular Rate:  60 PR Interval:  116 QRS Duration: 104 QT Interval:  414 QTC Calculation: 414 R Axis:   86 Text Interpretation: Sinus rhythm Borderline short PR interval Confirmed by Gilda Crease [phone]) on 10/04/2020 11:27:51 PM   Radiology No results found.  Procedures Procedures   Medications Ordered in ED Medications - No data to display  ED Course  I have reviewed the triage vital signs and the nursing notes.  Pertinent labs & imaging results that were available during my care of the patient were reviewed by me and considered in my medical decision making (see chart for details).    MDM Rules/Calculators/A&P                          Patient presents to the emergency department for chest pain.  Patient has already had an inpatient work-up for this and it was felt to be noncardiac in nature.  He has been experiencing pain since.  Pain is very atypical, seems to be related to lifting heavy objects.  There is not an exertional component, however.  EKG without acute changes.  Patient suddenly decided he did not want to have any work-up and left AGAINST MEDICAL ADVICE.  I  doubt any acute emergency medical problem that would need further work-up at this time.  Final Clinical Impression(s) / ED Diagnoses Final diagnoses:  Atypical chest pain  Rx / DC Orders ED Discharge Orders    None       Shemiah Rosch, Canary Brim, MD 10/04/20 [phone]

## 2020-10-04 NOTE — ED Notes (Signed)
Pt left AMA due to ride showing up and stating he didn't have any other way home.

## 2020-10-04 NOTE — ED Triage Notes (Signed)
Pt BIB EMS. Pt with a c/o intermittent dizziness, fatigue, chest pain x 2 weeks. Pt states he was started on a "fluid pill" 2 weeks ago. Pt very anxious in appearance with noted hyperventilations.

## 2020-10-09 NOTE — Progress Notes (Signed)
Patient ID: Kenneth Bautista, male    DOB: [birth year]  MRN: [id number]  CC: Chest Pain Follow-Up  Subjective: Kenneth Bautista is a 35 y.o. male who presents for chest pain follow-up. He is accompanied by his Mother. His concerns today include:  1. CHEST PAIN FOLLOW-UP: 08/25/2020: - Stable during today's office visit. - Referral to Cardiology for further evaluation and management.   Visit 09/05/2020 at [facility] per MD note: Polysubstance Abuse- Cocaine, Alcohol, Tobacco, amphetamines GERD Hx of Cocaine Associated vasospasm - will stop PPI and trial H2 blocker - discussed at length how tobacco, cocaine, alcohol, and amphetamines lead to heart disease - offered referral to psych or addiction medicine; deferred at this time  PRN follow up unless new symptoms or abnormal test results warranting change in plan:  When substance free and testing negative, would consider future ischemic testing (CCTA)  Would be reasonable for  APP Follow up  Visit 10/04/2020 at [facility] per MD note: Patient presents to the emergency department for chest pain.  Patient has already had an inpatient work-up for this and it was felt to be noncardiac in nature.  He has been experiencing pain since.  Pain is very atypical, seems to be related to lifting heavy objects.  There is not an exertional component, however.  EKG without acute changes.  Patient suddenly decided he did not want to have any work-up and left AGAINST MEDICAL ADVICE.  I doubt any acute emergency medical problem that would need further work-up at this time.  10/10/2020: Continued chest pain. Today reports he is no longer smoking, using cocaine, using amphetamines, or drinking alcohol. Reports he does use marijuana. Taking Pepcid and not helping. Reports he was unaware to follow-up with Cardiology.   Patient Active Problem List   Diagnosis Date Noted  . Cocaine abuse (HCC) 09/05/2020  .  Gastroesophageal reflux disease without esophagitis 09/05/2020  . Alcohol abuse 09/05/2020  . Atypical chest pain 08/17/2020  . Tobacco abuse 02/21/2020  . Asthma, mild intermittent 02/21/2020  . Dental caries 02/21/2020  . Costochondritis 02/21/2020     Current Outpatient Medications on File Prior to Visit  Medication Sig Dispense Refill  . albuterol (VENTOLIN HFA) 108 (90 Base) MCG/ACT inhaler Inhale 2 puffs into the lungs every 6 (six) hours as needed for wheezing or shortness of breath. 16 g 0  . famotidine (PEPCID) 20 MG tablet Take 1 tablet (20 mg total) by mouth daily. 90 tablet 3  . IBUPROFEN PO Take 400 mg by mouth every 6 (six) hours as needed (pain).     No current facility-administered medications on file prior to visit.    No Known Allergies  Social History   Socioeconomic History  . Marital status: Single    Spouse name: Not on file  . Number of children: Not on file  . Years of education: Not on file  . Highest education level: Not on file  Occupational History  . Not on file  Tobacco Use  . Smoking status: Current Every Day Smoker  . Smokeless tobacco: Never Used  . Tobacco comment: since age 6  Vaping Use  . Vaping Use: Never used  Substance and Sexual Activity  . Alcohol use: Yes    Comment: 1/5th of gin every 1-2 weeks  . Drug use: Yes    Types: Marijuana, Cocaine    Comment: last cocatin use 09/2020  . Sexual activity: Not on file  Other Topics  Concern  . Not on file  Social History Narrative  . Not on file   Social Determinants of Health   Financial Resource Strain: Not on file  Food Insecurity: Not on file  Transportation Needs: Not on file  Physical Activity: Not on file  Stress: Not on file  Social Connections: Not on file  Intimate Partner Violence: Not on file    Family History  Problem Relation Age of Onset  . Heart disease Mother        details unknown  . Heart disease Father        details unknown    Past Surgical  History:  Procedure Laterality Date  . None      ROS: Review of Systems Negative except as stated above  PHYSICAL EXAM: BP 113/86 (BP Location: Left Arm, Patient Position: Sitting, Cuff Size: Normal)   Pulse 97   Temp 98 F (36.7 C)   Resp 12   Ht 5' 5.98" (1.676 m)   Wt 132 lb 6.4 oz (60.1 kg)   SpO2 100%   BMI 21.38 kg/m   Physical Exam HENT:     Head: Normocephalic and atraumatic.  Eyes:     Extraocular Movements: Extraocular movements intact.     Conjunctiva/sclera: Conjunctivae normal.     Pupils: Pupils are equal, round, and reactive to light.  Cardiovascular:     Rate and Rhythm: Normal rate and regular rhythm.     Pulses: Normal pulses.     Heart sounds: Normal heart sounds.  Pulmonary:     Effort: Pulmonary effort is normal.     Breath sounds: Normal breath sounds.  Musculoskeletal:     Cervical back: Normal range of motion and neck supple.  Neurological:     General: No focal deficit present.     Mental Status: He is alert and oriented to person, place, and time.  Psychiatric:        Mood and Affect: Mood normal.        Behavior: Behavior normal.    ASSESSMENT AND PLAN: 1. Atypical chest pain: - Atypical chest pain ongoing. Reference HPI for additional details. - Patient presents today in stable condition and without cardiorespiratory distress.  - Follow-up with Cardiology as scheduled.   Patient was given the opportunity to ask questions.  Patient verbalized understanding of the plan and was able to repeat key elements of the plan. Patient was given clear instructions to go to Emergency Department or return to medical center if symptoms don't improve, worsen, or new problems develop.The patient verbalized understanding.   Requested Prescriptions    No prescriptions requested or ordered in this encounter   Follow-up with Cardiology as scheduled.   Rema Fendt, NP

## 2020-10-10 ENCOUNTER — Encounter: Payer: Self-pay | Admitting: Family

## 2020-10-10 ENCOUNTER — Ambulatory Visit (INDEPENDENT_AMBULATORY_CARE_PROVIDER_SITE_OTHER): Payer: 59 | Admitting: Family

## 2020-10-10 ENCOUNTER — Other Ambulatory Visit: Payer: Self-pay

## 2020-10-10 VITALS — BP 113/86 | HR 97 | Temp 98.0°F | Resp 12 | Ht 65.98 in | Wt 132.4 lb

## 2020-10-10 DIAGNOSIS — R0789 Other chest pain: Secondary | ICD-10-CM | POA: Diagnosis not present

## 2020-10-10 NOTE — Progress Notes (Signed)
Pt loss consciousness at work been feeling lightheaded and dizzy for few days

## 2020-10-18 ENCOUNTER — Emergency Department (HOSPITAL_COMMUNITY): Payer: 59

## 2020-10-18 ENCOUNTER — Encounter (HOSPITAL_COMMUNITY): Payer: Self-pay | Admitting: Emergency Medicine

## 2020-10-18 ENCOUNTER — Emergency Department (HOSPITAL_COMMUNITY)
Admission: EM | Admit: 2020-10-18 | Discharge: 2020-10-18 | Disposition: A | Discharge status: Home / Self Care | Payer: 59 | Attending: Emergency Medicine | Admitting: Emergency Medicine

## 2020-10-18 DIAGNOSIS — R42 Dizziness and giddiness: Secondary | ICD-10-CM | POA: Insufficient documentation

## 2020-10-18 DIAGNOSIS — R0602 Shortness of breath: Secondary | ICD-10-CM | POA: Diagnosis not present

## 2020-10-18 DIAGNOSIS — R062 Wheezing: Secondary | ICD-10-CM | POA: Diagnosis not present

## 2020-10-18 DIAGNOSIS — F17200 Nicotine dependence, unspecified, uncomplicated: Secondary | ICD-10-CM | POA: Diagnosis not present

## 2020-10-18 DIAGNOSIS — R0789 Other chest pain: Secondary | ICD-10-CM | POA: Diagnosis not present

## 2020-10-18 DIAGNOSIS — R079 Chest pain, unspecified: Secondary | ICD-10-CM

## 2020-10-18 LAB — BASIC METABOLIC PANEL
Anion gap: 13 (ref 5–15)
BUN: 9 mg/dL (ref 6–20)
CO2: 22 mmol/L (ref 22–32)
Calcium: 9.7 mg/dL (ref 8.9–10.3)
Chloride: 96 mmol/L — ABNORMAL LOW (ref 98–111)
Creatinine, Ser: 1.4 mg/dL — ABNORMAL HIGH (ref 0.61–1.24)
GFR, Estimated: >60 mL/min (ref >60)
Glucose, Bld: 87 mg/dL (ref 70–99)
Potassium: 3.6 mmol/L (ref 3.5–5.1)
Sodium: 131 mmol/L — ABNORMAL LOW (ref 135–145)

## 2020-10-18 LAB — CBC
HCT: 46.5 % (ref 39.0–52.0)
Hemoglobin: 16.2 g/dL (ref 13.0–17.0)
MCH: 31 pg (ref 26.0–34.0)
MCHC: 34.8 g/dL (ref 30.0–36.0)
MCV: 88.9 fL (ref 80.0–100.0)
Platelets: 448 10*3/uL — ABNORMAL HIGH (ref 150–400)
RBC: 5.23 MIL/uL (ref 4.22–5.81)
RDW: 12.3 % (ref 11.5–15.5)
WBC: 9.2 10*3/uL (ref 4.0–10.5)
nRBC: 0 % (ref 0.0–0.2)

## 2020-10-18 LAB — RAPID URINE DRUG SCREEN, HOSP PERFORMED
Amphetamines: POSITIVE — AB
Barbiturates: NOT DETECTED
Benzodiazepines: NOT DETECTED
Cocaine: NOT DETECTED
Opiates: NOT DETECTED
Tetrahydrocannabinol: POSITIVE — AB

## 2020-10-18 LAB — TROPONIN I (HIGH SENSITIVITY)
Troponin I (High Sensitivity): 4 ng/L (ref <18)
Troponin I (High Sensitivity): 7 ng/L (ref <18)

## 2020-10-18 NOTE — ED Triage Notes (Signed)
[place] cp and sob , pt still smokes and just recently told he has lung CA and also used cocaine about 2 weeks ago

## 2020-10-18 NOTE — ED Provider Notes (Signed)
Emergency Medicine Provider Triage Evaluation Note  WEAVER TWEED , a 35 y.o. male  was evaluated in triage.  Pt complains of CP, hx cocaine use but denies x 2 weeks, SHOB, recently diagnosed with lung ca. Smokes cigarettes. Symptoms started after walking today.  Review of Systems  Positive: SHOB, CP Negative: Nausea, vomiting   Physical Exam  There were no vitals taken for this visit. Gen:   Awake, no distress   Resp:  Normal effort  MSK:   Moves extremities without difficulty  Other:    Medical Decision Making  Medically screening exam initiated at 2:25 PM.  Appropriate orders placed.  VALGENE DELOATCH was informed that the remainder of the evaluation will be completed by another provider, this initial triage assessment does not replace that evaluation, and the importance of remaining in the ED until their evaluation is complete.     Jeannie Fend, PA-C 10/18/20 1427    Tilden Fossa, MD 10/19/20 [phone]

## 2020-10-18 NOTE — Discharge Instructions (Signed)
Use your albuterol inhaler when he began feeling short of breath and wheezy.  You can do 2 puffs every 15 minutes if you are having active shortness of breath and wheezing. Follow close with your primary care provider to discuss your recurring symptoms. Return to the emergency department if your symptoms are persisting and not improved with rest or your albuterol inhaler.

## 2020-10-18 NOTE — ED Provider Notes (Signed)
[facility] EMERGENCY DEPARTMENT Provider Note   CSN: [id number] Arrival date & time: 10/18/20  1426     History Chief Complaint  Patient presents with  . Shortness of Breath  . Chest Pain     Kenneth Bautista is a 35 y.o. male with PMHx cocaine abuse, alcohol abuse, asthma, atypical chest pain.  Patient is presenting for evaluation of persisting symptoms including shortness of breath with exertion.  He states with activity he developed shortness of breath with wheezing, sharp right-sided chest pain, and sometimes lightheadedness.  Symptoms recurred today therefore he presents for reevaluation.  He has been evaluated multiple times in the ED for this. He was last seen on Oct 05, 2020 where he left the ED AMA prior to work-up.  He was also seen in March of this year, as well as admitted just prior to that in early March for similar symptoms.  He had echocardiogram done on 08/18/2020 which was overall normal.  Patient states symptoms are not worsening anyway, they just persist.  Sometimes he uses his albuterol inhaler when he has the symptoms and it does provide some relief.  States they have prescribed him antacids for this, however they do not provide him adequate relief.  Denies any new leg swelling or pain, associated diaphoresis, nausea, radiation to his neck, jaw, back.   The history is provided by the patient.       Past Medical History:  Diagnosis Date  . Arthritis   . Asthma   . Substance abuse [facility])     Patient Active Problem List   Diagnosis Date Noted  . Cocaine abuse (HCC) 09/05/2020  . Gastroesophageal reflux disease without esophagitis 09/05/2020  . Alcohol abuse 09/05/2020  . Atypical chest pain 08/17/2020  . Tobacco abuse 02/21/2020  . Asthma, mild intermittent 02/21/2020  . Dental caries 02/21/2020  . Costochondritis 02/21/2020    Past Surgical History:  Procedure Laterality Date  . None         Family History  Problem Relation Age of  Onset  . Heart disease Mother        details unknown  . Heart disease Father        details unknown    Social History   Tobacco Use  . Smoking status: Current Every Day Smoker  . Smokeless tobacco: Never Used  . Tobacco comment: since age 26  Vaping Use  . Vaping Use: Never used  Substance Use Topics  . Alcohol use: Yes    Comment: 1/5th of gin every 1-2 weeks  . Drug use: Yes    Types: Marijuana, Cocaine    Comment: last cocatin use 09/2020    Home Medications Prior to Admission medications   Medication Sig Start Date End Date Taking? Authorizing Provider  albuterol (VENTOLIN HFA) 108 (90 Base) MCG/ACT inhaler Inhale 2 puffs into the lungs every 6 (six) hours as needed for wheezing or shortness of breath. 01/27/20   Arvilla Market, DO  famotidine (PEPCID) 20 MG tablet Take 1 tablet (20 mg total) by mouth daily. 09/05/20   Riley Lam A, MD  IBUPROFEN PO Take 400 mg by mouth every 6 (six) hours as needed (pain).    [provider]    Allergies    Patient has no known allergies.  Review of Systems   Review of Systems  All other systems reviewed and are negative.   Physical Exam Updated Vital Signs BP 135/82 (BP Location: Right Arm)   Pulse  64   Resp 13   SpO2 100%   Physical Exam Vitals and nursing note reviewed.  Constitutional:      General: He is not in acute distress.    Appearance: He is well-developed. He is not ill-appearing.  HENT:     Head: Normocephalic and atraumatic.  Eyes:     Conjunctiva/sclera: Conjunctivae normal.  Pulmonary:     Effort: Pulmonary effort is normal.  Abdominal:     Palpations: Abdomen is soft.  Skin:    General: Skin is warm.  Neurological:     Mental Status: He is alert.  Psychiatric:        Behavior: Behavior normal.     ED Results / Procedures / Treatments   Labs (all labs ordered are listed, but only abnormal results are displayed) Labs Reviewed  BASIC METABOLIC PANEL - Abnormal;  Notable for the following components:      Result Value   Sodium 131 (*)    Chloride 96 (*)    Creatinine, Ser 1.40 (*)    All other components within normal limits  CBC - Abnormal; Notable for the following components:   Platelets 448 (*)    All other components within normal limits  RAPID URINE DRUG SCREEN, HOSP PERFORMED - Abnormal; Notable for the following components:   Amphetamines POSITIVE (*)    Tetrahydrocannabinol POSITIVE (*)    All other components within normal limits  TROPONIN I (HIGH SENSITIVITY)  TROPONIN I (HIGH SENSITIVITY)    EKG EKG Interpretation  Date/Time:  Wednesday Oct 18 2020 14:32:31 EDT Ventricular Rate:  104 PR Interval:  112 QRS Duration: 108 QT Interval:  350 QTC Calculation: 460 R Axis:   79 Text Interpretation: Sinus tachycardia Non-specific ST-t changes Confirmed by Cathren Laine ([id number]) on 10/18/2020 6:51:18 PM   Radiology DG Chest 1 View  Result Date: 10/18/2020 CLINICAL DATA:  Chest pain with shortness of breath, tobacco use EXAM: CHEST  1 VIEW COMPARISON:  08/26/2020 FINDINGS: Cardiac shadow is within normal limits. The lungs are well aerated bilaterally. Previously seen nodule over the right lung base is not well appreciated and was likely related to a nipple shadow. No sizable effusion is noted. No bony abnormality is seen. IMPRESSION: No acute abnormality noted. Electronically Signed   By: Alcide Clever M.D.   On: 10/18/2020 15:19    Procedures Procedures   Medications Ordered in ED Medications - No data to display  ED Course  I have reviewed the triage vital signs and the nursing notes.  Pertinent labs & imaging results that were available during my care of the patient were reviewed by me and considered in my medical decision making (see chart for details).    MDM Rules/Calculators/A&P                          Patient presenting for reevaluation of recurrent shortness of breath with wheezing, right-sided sharp chest pain.   Symptoms occur with exertion and improved with rest and albuterol.  Has been evaluated many times in the ED for similar symptoms.  Symptoms are worsening or changing in any way.  Had recurrence of symptoms today with lightheadedness, improved with rest.  Chest pain is atypical.  On examination he is well-appearing and in no distress.  Right-sided chest pain is reproducible with palpation.  Lungs are clear.  Chest x-ray is negative for infiltrate.  Troponins x2 are negative.  Metabolic panel with mildly elevated creatinine 1.4, otherwise without  acute significant change from baseline.  UDS obtained in triage is positive for amphetamines and cannabinoids.  EKG is nonischemic.  Patient has follow-up with PCP on Friday.  As patient has wheezing and shortness of breath with exertion, improved with albuterol, this seemingly likely related to asthma.  Regarding the sharp right-sided chest pains that accompany.  The symptoms are atypical for ACS, reproducible on palpation. Few risk factors for CAD. Seems less likely cardiac etiology.  Encouraged to use albuterol more frequently as needed for shortness of breath.  Discussed his recurrent symptoms with PCP on Friday.  Return if worsening.  Patient verbalized understanding agrees with care plan for discharge.  Discussed patient with Dr. Denton Lank, who agrees with workup and care plan.   Final Clinical Impression(s) / ED Diagnoses Final diagnoses:  Atypical chest pain  Shortness of breath    Rx / DC Orders ED Discharge Orders    None       Mozella Rexrode, [place] N, PA-C 10/18/20 2347    Cathren Laine, MD 10/20/20 [phone]

## 2020-10-18 NOTE — ED Notes (Signed)
Pt refused discharge vitals 

## 2020-10-18 NOTE — ED Notes (Signed)
Pt offered [place] sandwich and water

## 2020-10-18 NOTE — ED Notes (Signed)
Patient verbalized understanding of discharge instructions. Opportunity for questions and answers.  

## 2020-10-19 NOTE — Progress Notes (Signed)
Cardiology Office Note   Date:  10/20/2020   ID:  [name], DOB [birth year], MRN [id number]  PCP:  Kenneth Market, DO  Cardiologist:  Dr. Izora Ribas    Chief Complaint  Patient presents with  . Chest Pain    Tachycardia       History of Present Illness: Kenneth Bautista is a 35 y.o. male who presents for hx of asthma, tobacco/THC use, cocaine use, sporadic ETOH has been seen 3 times in hospital and ER since 08/17/20.  It began 7 months ago.  EKG sinus tachy with non-specific T wave changes, tele shows SR without ectopy in March.   Hs Troponin 52,58,16 In 08/26/20 3 and 3  Seen in ER  10/04/20 and 5/18 troponin 4 and 7.  uds + amphetamines and marijuana.  Echo in March with EF55 to 60%, no RWMA  Valves normal.  ekg SR normal all reviewed since March and no acute ST changes.  CBC normal Na 131 K+ 3.6 Cr 1.40 on recent ER visits.   Saw Dr. Izora Ribas in April.  Pt declined appt with therapist to stop drugs.  Per MD if chest pain continues cardiac CTA would be next step.   Today chest pain continues and he was seen in Er yesterday with chest pain with neg troponin.  No EKG changes.  Today still with chest pain. No nausea or SOB.  No diaphoresis.  In hospital GI cocktail helped at one point.  Pain seems worse off PPI and on pepcid alone.   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  In addition to decreased appetite.  Though this pain different from chest pain.  He smokes marijuana to help appetite. He has not had amphetamines or cocaine today.  He also complains of racing HR.  This tends to make his chest pain worse.  Happens almost daily.   Past Medical History:  Diagnosis Date  . Arthritis   . Asthma   . Substance abuse [facility])     Past Surgical History:  Procedure Laterality Date  . None       Current Outpatient Medications  Medication Sig Dispense Refill  . albuterol (VENTOLIN HFA) 108 (90 Base) MCG/ACT inhaler Inhale  2 puffs into the lungs every 6 (six) hours as needed for wheezing or shortness of breath. 16 g 0  . famotidine (PEPCID) 20 MG tablet Take 20 mg by mouth daily.    . pantoprazole (PROTONIX) 40 MG tablet Take 1 tablet (40 mg total) by mouth daily. 30 tablet 11   No current facility-administered medications for this visit.    Allergies:   Patient has no known allergies.    Social History:  The patient  reports that he has been smoking. He has never used smokeless tobacco. He reports current alcohol use. He reports current drug use. Drugs: Marijuana and Cocaine.   Family History:  The patient's family history includes Heart disease in his father and mother.    ROS:  General:no colds or fevers, no weight changes Skin:no rashes or ulcers HEENT:no blurred vision, no congestion CV:see HPI PUL:see HPI GI:no diarrhea constipation or melena, no indigestion GU:no hematuria, no dysuria, decreased appetite and epigastric pain with hernia MS:no joint pain, no claudication Neuro:no syncope, no lightheadedness Endo:no diabetes, no thyroid disease  Wt Readings from Last 3 Encounters:  10/20/20 137 lb 3.2 oz (62.2 kg)  10/10/20 132 lb 6.4 oz (60.1 kg)  10/04/20 125 lb (  56.7 kg)     PHYSICAL EXAM: VS:  BP (!) 98/50   Pulse 99   Ht 5\' 5"  (1.651 m)   Wt 137 lb 3.2 oz (62.2 kg)   SpO2 98%   BMI 22.83 kg/m  , BMI Body mass index is 22.83 kg/m. General:Pleasant affect, NAD Skin:Warm and dry, brisk capillary refill HEENT:normocephalic, sclera clear, mucus membranes moist Neck:supple, no JVD, no bruits  Heart:S1S2 RRR without murmur, gallup, rub or click Lungs:clear without rales, rhonchi, or wheezes , + tenderness epigastric with small hernia.  + BS, do not palpate liver spleen or masses Ext:no lower ext edema, 2+ pedal pulses, 2+ radial pulses Neuro:alert and oriented X 3, MAE, follows commands, + facial symmetry    EKG:  EKG is ordered today. The ekg ordered today demonstrates  SR some early repol but no acute changes from prior EKGs.    Recent Labs: 08/17/2020: TSH 1.801 08/26/2020: ALT 18 10/18/2020: BUN 9; Creatinine, Ser 1.40; Hemoglobin 16.2; Platelets 448; Potassium 3.6; Sodium 131    Lipid Panel    Component Value Date/Time   CHOL 132 08/18/2020 0455   TRIG 77 08/18/2020 0455   HDL 64 08/18/2020 0455   CHOLHDL 2.1 08/18/2020 0455   VLDL 15 08/18/2020 0455   LDLCALC 53 08/18/2020 0455       Other studies Reviewed: Additional studies/ records that were reviewed today include: . Echo 08/18/20 IMPRESSIONS    1. Left ventricular ejection fraction, by estimation, is 55 to 60%. The  left ventricle has normal function. The left ventricle has no regional  wall motion abnormalities. Left ventricular diastolic parameters were  normal.  2. Right ventricular systolic function is normal. The right ventricular  size is normal. Tricuspid regurgitation signal is inadequate for assessing  PA pressure.  3. The mitral valve is normal in structure. No evidence of mitral valve  regurgitation. No evidence of mitral stenosis.  4. The aortic valve is tricuspid. Aortic valve regurgitation is not  visualized. No aortic stenosis is present.  5. The inferior vena cava is normal in size with greater than 50%  respiratory variability, suggesting right atrial pressure of 3 mmHg.   FINDINGS  Left Ventricle: Left ventricular ejection fraction, by estimation, is 55  to 60%. The left ventricle has normal function. The left ventricle has no  regional wall motion abnormalities. Definity contrast agent was given IV  to delineate the left ventricular  endocardial borders. The left ventricular internal cavity size was normal  in size. There is no left ventricular hypertrophy. Left ventricular  diastolic parameters were normal.   Right Ventricle: The right ventricular size is normal. No increase in  right ventricular wall thickness. Right ventricular systolic function  is  normal. Tricuspid regurgitation signal is inadequate for assessing PA  pressure.   Left Atrium: Left atrial size was normal in size.   Right Atrium: Right atrial size was normal in size.   Pericardium: There is no evidence of pericardial effusion.   Mitral Valve: The mitral valve is normal in structure. No evidence of  mitral valve regurgitation. No evidence of mitral valve stenosis.   Tricuspid Valve: The tricuspid valve is normal in structure. Tricuspid  valve regurgitation is trivial. No evidence of tricuspid stenosis.   Aortic Valve: The aortic valve is tricuspid. Aortic valve regurgitation is  not visualized. No aortic stenosis is present. Aortic valve mean gradient  measures 2.0 mmHg. Aortic valve peak gradient measures 4.8 mmHg. Aortic  valve area, by VTI measures 5.73  cm.   Pulmonic Valve: The pulmonic valve was grossly normal. Pulmonic valve  regurgitation is trivial. No evidence of pulmonic stenosis.   Aorta: The aortic root is normal in size and structure.   Venous: The inferior vena cava is normal in size with greater than 50%  respiratory variability, suggesting right atrial pressure of 3 mmHg.   ASSESSMENT AND PLAN:  1.  Chest pain, atypical with one ER visit with troponin to 58 then down to 16.  Follow up troponin with ER all 3-7 neg MI.  Wait time for cardiac CTA is 6 weeks, discussed with Dr. Izora Ribas and will proceed with lexiscan.  He has pain episodically today  But is same pain he has had for 7 months.  Hope for lexiscan on Monday but if pain increases recommend ER again. Maybe we could do there earlier.   Because atypical and less pain on PPI will add protonix 40 daily.stop pepcid.  Discussed risks involved for lexiscan and possible outcomes, he is agreeable.   2.  Epigastric hernia and GERD pain to palpation, has been going on for some time.  Will refer back to PCP for this. But PPI may be beneficial.  3.  Drug use, tobacco use, he has not used  today and is trying to stop all but marijuana which helps his appetite.    Current medicines are reviewed with the patient today.  The patient Has no concerns regarding medicines.  The following changes have been made:  See above Labs/ tests ordered today include:see above  Disposition:   FU:  see above  Signed, Nada Boozer, NP  10/20/2020 2:33 PM    [facility] [address] [place], [place], [place]  [address]/ [name] [place], [place] Phone: [phone]; Fax: [phone]  [phone]

## 2020-10-20 ENCOUNTER — Other Ambulatory Visit: Payer: Self-pay

## 2020-10-20 ENCOUNTER — Ambulatory Visit (INDEPENDENT_AMBULATORY_CARE_PROVIDER_SITE_OTHER): Payer: 59 | Admitting: Cardiology

## 2020-10-20 ENCOUNTER — Encounter: Payer: Self-pay | Admitting: Cardiology

## 2020-10-20 ENCOUNTER — Ambulatory Visit: Payer: 59 | Admitting: Internal Medicine

## 2020-10-20 VITALS — BP 98/50 | HR 99 | Ht 65.0 in | Wt 137.2 lb

## 2020-10-20 DIAGNOSIS — F1410 Cocaine abuse, uncomplicated: Secondary | ICD-10-CM | POA: Diagnosis not present

## 2020-10-20 DIAGNOSIS — R000 Tachycardia, unspecified: Secondary | ICD-10-CM

## 2020-10-20 DIAGNOSIS — R0789 Other chest pain: Secondary | ICD-10-CM

## 2020-10-20 DIAGNOSIS — K219 Gastro-esophageal reflux disease without esophagitis: Secondary | ICD-10-CM | POA: Diagnosis not present

## 2020-10-20 MED ORDER — PANTOPRAZOLE SODIUM 40 MG PO TBEC: 40.0000 mg | DELAYED_RELEASE_TABLET | Freq: Every day | ORAL | 11 refills | Status: DC

## 2020-10-20 NOTE — Patient Instructions (Addendum)
Medication Instructions:  1) Discontinue Pepcid when you start the Protonix  2) Start Pantoprazole (Protonix) 40 mg daily    *If you need a refill on your cardiac medications before your next appointment, please call your pharmacy*   Lab Work: None  If you have labs (blood work) drawn today and your tests are completely normal, you will receive your results only by: Marland Kitchen MyChart Message (if you have MyChart) OR . A paper copy in the mail If you have any lab test that is abnormal or we need to change your treatment, we will call you to review the results.   Testing/Procedures: Your physician has requested that you have a lexiscan myoview. For further information please visit [email or web address] Please follow instruction sheet, as given.  A zio monitor was ordered today. It will remain on for 14 days. You will then return monitor and event diary in provided box. It takes 1-2 weeks for report to be downloaded and returned to [name]. We will call you with the results. If monitor falls off or has orange flashing light, please call Zio for further instructions.    Follow-Up: At [facility], you and your health needs are our priority.  As part of our continuing mission to provide you with exceptional heart care, we have created designated Provider Care Teams.  These Care Teams include your primary Cardiologist (physician) and Advanced Practice Providers (APPs -  Physician Assistants and Nurse Practitioners) who all work together to provide you with the care you need, when you need it.  We recommend signing up for the patient portal called "MyChart".  Sign up information is provided on this After Visit Summary.  MyChart is used to connect with patients for Virtual Visits (Telemedicine).  Patients are able to view lab/test results, encounter notes, upcoming appointments, etc.  Non-urgent messages can be sent to your provider as well.   To learn more about what you can do with MyChart, go to  [email or web address].    Your next appointment:   2 week(s)  The format for your next appointment:   In Person  Provider:   You may see Christell Constant, MD or one of the following Advanced Practice Providers on your designated Care Team:    Ronie Spies, PA-C  Jacolyn Reedy, PA-C    Other Instructions Christena Deem- Long Term Monitor Instructions   Your physician has requested you wear a ZIO patch monitor for 14 days.  This is a single patch monitor.   [employer] supplies one patch monitor per enrollment. Additional stickers are not available. Please do not apply patch if you will be having a Nuclear Stress Test, Echocardiogram, Cardiac CT, MRI, or Chest Xray during the period you would be wearing the monitor. The patch cannot be worn during these tests. You cannot remove and re-apply the ZIO XT patch monitor.  Your ZIO patch monitor will be sent Fed Ex from [employer] directly to your home address. It may take 3-5 days to receive your monitor after you have been enrolled.  Once you have received your monitor, please review the enclosed instructions. Your monitor has already been registered assigning a specific monitor serial # to you.  Billing and Patient Assistance Program Information   We have supplied [employer] with any of your insurance information on file for billing purposes. [employer] offers a sliding scale Patient Assistance Program for patients that do not have insurance, or whose insurance does not completely cover the cost of the ZIO monitor.   You  must apply for the Patient Assistance Program to qualify for this discounted rate.     To apply, please call [employer] at [phone], select option 4, then select option 2, and ask to apply for Patient Assistance Program.  Meredeth Ide will ask your household income, and how many people are in your household.  They will quote your out-of-pocket cost based on that information.  [employer] will also be able to set up a 92-month,  interest-free payment plan if needed.  Applying the monitor   Shave hair from upper left chest.  Hold abrader disc by orange tab. Rub abrader in 40 strokes over the upper left chest as indicated in your monitor instructions.  Clean area with 4 enclosed alcohol pads. Let dry.  Apply patch as indicated in monitor instructions. Patch will be placed under collarbone on left side of chest with arrow pointing upward.  Rub patch adhesive wings for 2 minutes. Remove white label marked "1". Remove the white label marked "2". Rub patch adhesive wings for 2 additional minutes.  While looking in a mirror, press and release button in center of patch. A small green light will flash 3-4 times. This will be your only indicator that the monitor has been turned on. ?  Do not shower for the first 24 hours. You may shower after the first 24 hours.  Press the button if you feel a symptom. You will hear a small click. Record Date, Time and Symptom in the Patient Logbook.  When you are ready to remove the patch, follow instructions on the last 2 pages of the Patient Logbook. Stick patch monitor onto the last page of Patient Logbook.  Place Patient Logbook in the blue and white box.  Use locking tab on box and tape box closed securely.  The blue and white box has prepaid postage on it. Please place it in the mailbox as soon as possible. Your physician should have your test results approximately 7 days after the monitor has been mailed back to [facility].  Call [facility] Customer Care at [phone] if you have questions regarding your ZIO XT patch monitor. Call them immediately if you see an orange light blinking on your monitor.  If your monitor falls off in less than 4 days, contact our Monitor department at [phone]. ?If your monitor becomes loose or falls off after 4 days call [employer] at [phone] for suggestions on securing your monitor.?

## 2020-10-23 ENCOUNTER — Ambulatory Visit (INDEPENDENT_AMBULATORY_CARE_PROVIDER_SITE_OTHER): Payer: 59

## 2020-10-23 ENCOUNTER — Telehealth: Payer: Self-pay | Admitting: Cardiology

## 2020-10-23 ENCOUNTER — Telehealth: Payer: Self-pay | Admitting: *Deleted

## 2020-10-23 ENCOUNTER — Other Ambulatory Visit: Payer: Self-pay

## 2020-10-23 ENCOUNTER — Ambulatory Visit (HOSPITAL_COMMUNITY): Payer: 59 | Attending: Cardiology

## 2020-10-23 DIAGNOSIS — R0789 Other chest pain: Secondary | ICD-10-CM

## 2020-10-23 DIAGNOSIS — R000 Tachycardia, unspecified: Secondary | ICD-10-CM

## 2020-10-23 LAB — MYOCARDIAL PERFUSION IMAGING
LV dias vol: 127 mL (ref 62–150)
LV sys vol: 63 mL
Peak HR: 97 {beats}/min
Rest HR: 55 {beats}/min
SDS: 3
SRS: 1
SSS: 4
TID: 0.98

## 2020-10-23 MED ORDER — TECHNETIUM TC 99M TETROFOSMIN IV KIT
8.8000 | PACK | Freq: Once | INTRAVENOUS | Status: AC | PRN
  Administered 2020-10-23: 8.8 via INTRAVENOUS
  Filled 2020-10-23: qty 9

## 2020-10-23 MED ORDER — REGADENOSON 0.4 MG/5ML IV SOLN
0.4000 mg | Freq: Once | INTRAVENOUS | Status: AC
  Administered 2020-10-23: 0.4 mg via INTRAVENOUS

## 2020-10-23 MED ORDER — TECHNETIUM TC 99M TETROFOSMIN IV KIT
28.3000 | PACK | Freq: Once | INTRAVENOUS | Status: AC | PRN
  Administered 2020-10-23: 28.3 via INTRAVENOUS
  Filled 2020-10-23: qty 29

## 2020-10-23 NOTE — Telephone Encounter (Signed)
RN returned call to patient regarding lexiscan results. RN advised that Kenneth Bautista has yet to review the results and provide recommendations. RN advised that once recommendations are received the office would contact him. Patient verbalized understanding, and just wanted to be sure since he received a MyChart notification.

## 2020-10-23 NOTE — Telephone Encounter (Signed)
Patient enrolled for [employer] to ship a 14 day long term holter monitor to his home via UPS.   [employer]/ ZIO XT not in network with his [employer].

## 2020-10-23 NOTE — Progress Notes (Unsigned)
14 day [employer] long term holter monitor shipped to patients home.

## 2020-10-23 NOTE — Telephone Encounter (Signed)
Follow up:     Pt said he aw he saw his Stress Test results on My Chart, but did not understand it. He would like for somebody please all and explain it to him please.

## 2020-10-26 DIAGNOSIS — R000 Tachycardia, unspecified: Secondary | ICD-10-CM | POA: Diagnosis not present

## 2020-11-02 ENCOUNTER — Other Ambulatory Visit: Payer: Self-pay

## 2020-11-02 ENCOUNTER — Encounter: Payer: Self-pay | Admitting: Internal Medicine

## 2020-11-02 ENCOUNTER — Ambulatory Visit (INDEPENDENT_AMBULATORY_CARE_PROVIDER_SITE_OTHER): Payer: 59 | Admitting: Internal Medicine

## 2020-11-02 VITALS — BP 116/80 | HR 64 | Temp 98.4°F | Resp 20 | Wt 137.0 lb

## 2020-11-02 DIAGNOSIS — K219 Gastro-esophageal reflux disease without esophagitis: Secondary | ICD-10-CM | POA: Diagnosis not present

## 2020-11-02 DIAGNOSIS — R000 Tachycardia, unspecified: Secondary | ICD-10-CM | POA: Diagnosis not present

## 2020-11-02 DIAGNOSIS — K439 Ventral hernia without obstruction or gangrene: Secondary | ICD-10-CM | POA: Diagnosis not present

## 2020-11-02 NOTE — Progress Notes (Signed)
Has pain in upper abdominal area for several months.  Pain intermitten, 5/10

## 2020-11-02 NOTE — Progress Notes (Signed)
  Subjective:    Kenneth Bautista - 35 y.o. male MRN [id number]  Date of birth: 06/15/1985  HPI  Kenneth Bautista is here for follow up. Has been seen by cardiology for tachycardia and atypical chest pain. Has had Lexiscan perfromed---did not show any acute ischemia. Now has heart monitor in place through 6/9.   Endorses uncontrolled acid reflux despite switching from Pepcid to Protonix. Has been on acid blocking medications for more than several months and has daily, recurrent break through reflux.   Also reports concerns about a knot on his upper abdomen. Protrudes through skin. Painful to touch. Has been present since he was about 28-94 years old. Growing larger.      Health Maintenance:  Health Maintenance Due  Topic Date Due  . COVID-19 Vaccine (1) Never done  . Hepatitis C Screening  Never done    -  reports that he has been smoking. He has never used smokeless tobacco. - Review of Systems: Per HPI. - Past Medical History: Patient Active Problem List   Diagnosis Date Noted  . Cocaine abuse (HCC) 09/05/2020  . Gastroesophageal reflux disease without esophagitis 09/05/2020  . Alcohol abuse 09/05/2020  . Atypical chest pain 08/17/2020  . Tobacco abuse 02/21/2020  . Asthma, mild intermittent 02/21/2020  . Dental caries 02/21/2020  . Costochondritis 02/21/2020   - Medications: reviewed and updated   Objective:   Physical Exam BP 116/80 (BP Location: Right Arm, Patient Position: Sitting, Cuff Size: Normal)   Pulse 64   Temp 98.4 F (36.9 C)   Resp 20   Wt 137 lb (62.1 kg)   SpO2 98%   BMI 22.80 kg/m  Physical Exam Constitutional:      General: He is not in acute distress.    Appearance: He is not diaphoretic.  Cardiovascular:     Rate and Rhythm: Normal rate.  Pulmonary:     Effort: Pulmonary effort is normal. No respiratory distress.  Chest:     Comments: Cardiac monitor in place.  Abdominal:     Comments: Appears to have epigastric hernia.   Musculoskeletal:         General: Normal range of motion.  Skin:    General: Skin is warm and dry.  Neurological:     Mental Status: He is alert and oriented to person, place, and time.  Psychiatric:        Mood and Affect: Affect normal.        Judgment: Judgment normal.            Assessment & Plan:   1. Tachycardia Followed by cardiology. Complete cardiac monitoring and continue to keep log of symptoms. Follow up as planned.   2. Epigastric hernia Appears to have epigastric hernia on exam that is symptomatic. Will refer to gen surg for evaluation.  - Ambulatory referral to General Surgery  3. Gastroesophageal reflux disease, unspecified whether esophagitis present Has had longstanding GERD resistant to H2 blockers and PPI. Will refer to GI. Can see no record of diagnosis of hiatal hernia. May warrant endoscopy/further work up.  - Ambulatory referral to Gastroenterology   Marcy Siren, D.O. 11/02/2020, 9:11 AM Primary Care at [facility]

## 2020-11-09 ENCOUNTER — Ambulatory Visit (INDEPENDENT_AMBULATORY_CARE_PROVIDER_SITE_OTHER): Payer: 59 | Admitting: General Surgery

## 2020-11-09 ENCOUNTER — Other Ambulatory Visit: Payer: Self-pay

## 2020-11-09 ENCOUNTER — Encounter: Payer: Self-pay | Admitting: General Surgery

## 2020-11-09 VITALS — BP 144/77 | HR 74 | Temp 98.2°F | Ht 65.0 in | Wt 144.0 lb

## 2020-11-09 DIAGNOSIS — K439 Ventral hernia without obstruction or gangrene: Secondary | ICD-10-CM | POA: Diagnosis not present

## 2020-11-09 NOTE — H&P (View-Only) (Signed)
Patient ID: Kenneth Bautista, male   DOB: [birth year], 35 y.o.   MRN: [id number]  Chief Complaint  Patient presents with   New Patient (Initial Visit)    HPI Kenneth Bautista is a 35 y.o. male.   He has been referred by his primary care provider for evaluation of an epigastric hernia.  Kenneth Bautista states that he has had a lump just below his sternum since childhood.  It is more recently become painful and the pain occurs with increasing frequency.  He has never had surgery in this area.  He denies nausea or vomiting associated with the lump.  He says that his mother told him not to push on the lump as it could rupture.  The pain is pretty much constant and seems to radiate somewhat up into his chest.  It does not seem to be alleviated or exacerbated by anything in particular.  He is also being evaluated for tachycardia and atypical chest pain.  He had a Lexiscan that was negative for ischemia and is currently wearing a heart monitor, due to be removed today.  This pain is different from the pain referable to the lump below his sternum.  He also has refractory acid reflux, but states that the reflux and the pain at the epigastrium do not seem to be related.   Past Medical History:  Diagnosis Date   Arthritis    Asthma    Substance abuse (HCC)     Past Surgical History:  Procedure Laterality Date   None      Family History  Problem Relation Age of Onset   Heart disease Mother        details unknown   Heart disease Father        details unknown    Social History Social History   Tobacco Use   Smoking status: Every Day    Packs/day: 1.00    Pack years: 0.00    Types: Cigarettes   Smokeless tobacco: Never   Tobacco comments:    since age 59  Vaping Use   Vaping Use: Never used  Substance Use Topics   Alcohol use: Yes    Comment: 1/5th of gin every 1-2 weeks   Drug use: Yes    Types: Marijuana, Cocaine    Comment: last cocatin use 09/2020    No Known Allergies  Current  Outpatient Medications  Medication Sig Dispense Refill   albuterol (VENTOLIN HFA) 108 (90 Base) MCG/ACT inhaler Inhale 2 puffs into the lungs every 6 (six) hours as needed for wheezing or shortness of breath. 16 g 0   pantoprazole (PROTONIX) 40 MG tablet Take 1 tablet (40 mg total) by mouth daily. 30 tablet 11   No current facility-administered medications for this visit.    Review of Systems Review of Systems  Cardiovascular:  Positive for chest pain.  Gastrointestinal:  Positive for abdominal pain.  All other systems reviewed and are negative. Or as discussed in the history of present illness.  Blood pressure (!) 144/77, pulse 74, temperature 98.2 F (36.8 C), height 5\' 5"  (1.651 m), weight 144 lb (65.3 kg), SpO2 97 %. Body mass index is 23.96 kg/m.  Physical Exam Physical Exam Constitutional:      General: He is not in acute distress.    Appearance: He is normal weight.  HENT:     Head: Normocephalic and atraumatic.     Nose:     Comments: Covered with a mask    Mouth/Throat:  Comments: Covered with a mask Eyes:     General: No scleral icterus.       Right eye: No discharge.        Left eye: No discharge.  Neck:     Comments: No palpable cervical or supraclavicular lymphadenopathy.  The trachea is midline.  No thyromegaly or dominant thyroid masses appreciated.  The gland moves freely with deglutition. Cardiovascular:     Rate and Rhythm: Normal rate and regular rhythm.     Pulses: Normal pulses.  Pulmonary:     Effort: Pulmonary effort is normal.     Breath sounds: Normal breath sounds.  Abdominal:     Palpations: Abdomen is soft.     Hernia: A hernia is present.     Comments: There is a fat-containing epigastric hernia, just below his sternum.  It is tender to palpation, but soft.  Genitourinary:    Comments: Deferred Musculoskeletal:        General: No swelling or tenderness.  Skin:    General: Skin is warm and dry.  Neurological:     General: No focal  deficit present.     Mental Status: He is alert and oriented to person, place, and time.  Psychiatric:        Mood and Affect: Mood normal.        Behavior: Behavior normal.    Data Reviewed The negative Lexiscan was reviewed, dated 10/23/2020.  Results for Kenneth Bautista, Kenneth Bautista (MRN [id number]) as of 11/09/2020 15:55  Ref. Range 09/20/2012 22:27 08/18/2020 05:52 10/18/2020 18:50  Ethanol Latest Units: mg/dL 295    Ethanol % Latest Ref Range: 0.000 - 0.080 % 0.143 (H)    Amphetamines Latest Ref Range: NONE DETECTED   POSITIVE (A) POSITIVE (A)  Barbiturates Latest Ref Range: NONE DETECTED   NONE DETECTED NONE DETECTED  Benzodiazepines Latest Ref Range: NONE DETECTED   NONE DETECTED NONE DETECTED  Opiates Latest Ref Range: NONE DETECTED   NONE DETECTED NONE DETECTED  COCAINE Latest Ref Range: NONE DETECTED   POSITIVE (A) NONE DETECTED  Tetrahydrocannabinol Latest Ref Range: NONE DETECTED   POSITIVE (A) POSITIVE (A)  He has had positive drug screens for amphetamines, THC, and cocaine.  No imaging has been performed of the area of interest.  I reviewed the PCP note from November 02, 2020, which resulted in his referral to general surgery to evaluate the hernia.  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  There are multiple emergency department visits for atypical chest pain going back several years.  Assessment This is a 35 year old man with an epigastric hernia.  It has become increasingly painful, suggesting possible entrapment of preperitoneal fat.  I have offered him surgical repair.  Of note, he is currently wearing a cardiac monitor and has a history of cocaine associated vasospasm along with multiple cardiology evaluations.  None of these seem to demonstrate acute ischemia or concern for heart failure.  Plan I have offered him surgical repair and discussed the risks of the operation with him.  These include,  but are not limited to, bleeding, infection, hernia recurrence, mesh complications including migration or infection.  He will require a urine drug screen the day of his surgery.  He was also cautioned to avoid lifting, pushing, or pulling anything heavier than 10 pounds for total of 6 weeks from the time of his operation.  We will work on getting him scheduled.  Duanne Guess 11/09/2020, 3:49 PM

## 2020-11-09 NOTE — Patient Instructions (Addendum)
You have requested for your Epigastric Hernia be repaired. This will be scheduled at [facility] by Dr. Lady Gary at [facility].  Please see your (blue)pre-care sheet for information. Our surgery scheduler will call you to look at surgery dates and go over information.   You will need to arrange to be off work for 1-2 weeks but will have to have a lifting restriction of no more than 10 lbs for 6 weeks following your surgery.  Get help right away if: You develop sudden, severe pain near the area of your hernia. You have pain as well as nausea or vomiting. You have pain and the skin over your hernia changes color. You develop a fever. This information is not intended to replace advice given to you by your health care provider. Make sure you discuss any questions you have with your health care provider. Document Released: 10/20/2015 Document Revised: 01/21/2016 Document Reviewed: 10/20/2015 Elsevier Interactive Patient Education  2018 [employer].     Hernia, Adult     A hernia happens when tissue inside your body pushes out through a weak spot in your belly muscles (abdominal wall). This makes a round lump (bulge). The lump may be: In a scar from surgery that was done in your belly (incisional hernia). Near your belly button (umbilical hernia). In your groin (inguinal hernia). Your groin is the area where your leg meets your lower belly (abdomen). This kind of hernia could also be: In your scrotum, if you are male. In folds of skin around your vagina, if you are male. In your upper thigh (femoral hernia). Inside your belly (hiatal hernia). This happens when your stomach slides above the muscle between your belly and your chest (diaphragm). If your hernia is small and it does not cause pain, you may not need treatment. If your hernia is large or it causes pain, you may need surgery. Follow these instructions at home: Activity Avoid stretching or overusing (straining) the muscles near  your hernia. Straining can happen when you: Lift something heavy. Poop (have a bowel movement). Do not lift anything that is heavier than 10 lb (4.5 kg), or the limit that you are told, until your doctor says that it is safe. Use the strength of your legs when you lift something heavy. Do not use only your back muscles to lift. General instructions Do these things if told by your doctor so you do not have trouble pooping (constipation): Drink enough fluid to keep your pee (urine) pale yellow. Eat foods that are high in fiber. These include fresh fruits and vegetables, whole grains, and beans. Limit foods that are high in fat and processed sugars. These include foods that are fried or sweet. Take medicine for trouble pooping. When you cough, try to cough gently. You may try to push your hernia in by very gently pressing on it when you are lying down. Do not try to force the bulge back in if it will not push in easily. If you are overweight, work with your doctor to lose weight safely. Do not use any products that have nicotine or tobacco in them. These include cigarettes and e-cigarettes. If you need help quitting, ask your doctor. If you will be having surgery (hernia repair), watch your hernia for changes in shape, size, or color. Tell your doctor if you see any changes. Take over-the-counter and prescription medicines only as told by your doctor. Keep all follow-up visits as told by your doctor. Contact a doctor if: You get new  pain, swelling, or redness near your hernia. You poop fewer times in a week than normal. You have trouble pooping. You have poop (stool) that is more dry than normal. You have poop that is harder or larger than normal. Get help right away if: You have a fever. You have belly pain that gets worse. You feel sick to your stomach (nauseous). You throw up (vomit). Your hernia cannot be pushed in by very gently pressing on it when you are lying down. Do not try to  force the bulge back in if it will not push in easily. Your hernia: Changes in shape or size. Changes color. Feels hard or it hurts when you touch it. These symptoms may represent a serious problem that is an emergency. Do not wait to see if the symptoms will go away. Get medical help right away. Call your local emergency services (911 in the [place] Summary A hernia happens when tissue inside your body pushes out through a weak spot in the belly muscles. This creates a bulge. If your hernia is small and it does not hurt, you may not need treatment. If your hernia is large or it hurts, you may need surgery. If you will be having surgery, watch your hernia for changes in shape, size, or color. Tell your doctor about any changes. This information is not intended to replace advice given to you by your health care provider. Make sure you discuss any questions you have with your health care provider. Document Revised: 09/10/2018 Document Reviewed: 02/19/2017 Elsevier Patient Education  2021 [employer].

## 2020-11-09 NOTE — Progress Notes (Signed)
Patient ID: Kenneth Bautista, male   DOB: [birth year], 35 y.o.   MRN: [id number]  Chief Complaint  Patient presents with   New Patient (Initial Visit)    HPI Kenneth Bautista is a 35 y.o. male.   He has been referred by his primary care provider for evaluation of an epigastric hernia.  Kenneth Bautista states that he has had a lump just below his sternum since childhood.  It is more recently become painful and the pain occurs with increasing frequency.  He has never had surgery in this area.  He denies nausea or vomiting associated with the lump.  He says that his mother told him not to push on the lump as it could rupture.  The pain is pretty much constant and seems to radiate somewhat up into his chest.  It does not seem to be alleviated or exacerbated by anything in particular.  He is also being evaluated for tachycardia and atypical chest pain.  He had a Lexiscan that was negative for ischemia and is currently wearing a heart monitor, due to be removed today.  This pain is different from the pain referable to the lump below his sternum.  He also has refractory acid reflux, but states that the reflux and the pain at the epigastrium do not seem to be related.   Past Medical History:  Diagnosis Date   Arthritis    Asthma    Substance abuse (HCC)     Past Surgical History:  Procedure Laterality Date   None      Family History  Problem Relation Age of Onset   Heart disease Mother        details unknown   Heart disease Father        details unknown    Social History Social History   Tobacco Use   Smoking status: Every Day    Packs/day: 1.00    Pack years: 0.00    Types: Cigarettes   Smokeless tobacco: Never   Tobacco comments:    since age 59  Vaping Use   Vaping Use: Never used  Substance Use Topics   Alcohol use: Yes    Comment: 1/5th of gin every 1-2 weeks   Drug use: Yes    Types: Marijuana, Cocaine    Comment: last cocatin use 09/2020    No Known Allergies  Current  Outpatient Medications  Medication Sig Dispense Refill   albuterol (VENTOLIN HFA) 108 (90 Base) MCG/ACT inhaler Inhale 2 puffs into the lungs every 6 (six) hours as needed for wheezing or shortness of breath. 16 g 0   pantoprazole (PROTONIX) 40 MG tablet Take 1 tablet (40 mg total) by mouth daily. 30 tablet 11   No current facility-administered medications for this visit.    Review of Systems Review of Systems  Cardiovascular:  Positive for chest pain.  Gastrointestinal:  Positive for abdominal pain.  All other systems reviewed and are negative. Or as discussed in the history of present illness.  Blood pressure (!) 144/77, pulse 74, temperature 98.2 F (36.8 C), height 5\' 5"  (1.651 m), weight 144 lb (65.3 kg), SpO2 97 %. Body mass index is 23.96 kg/m.  Physical Exam Physical Exam Constitutional:      General: He is not in acute distress.    Appearance: He is normal weight.  HENT:     Head: Normocephalic and atraumatic.     Nose:     Comments: Covered with a mask    Mouth/Throat:  Comments: Covered with a mask Eyes:     General: No scleral icterus.       Right eye: No discharge.        Left eye: No discharge.  Neck:     Comments: No palpable cervical or supraclavicular lymphadenopathy.  The trachea is midline.  No thyromegaly or dominant thyroid masses appreciated.  The gland moves freely with deglutition. Cardiovascular:     Rate and Rhythm: Normal rate and regular rhythm.     Pulses: Normal pulses.  Pulmonary:     Effort: Pulmonary effort is normal.     Breath sounds: Normal breath sounds.  Abdominal:     Palpations: Abdomen is soft.     Hernia: A hernia is present.     Comments: There is a fat-containing epigastric hernia, just below his sternum.  It is tender to palpation, but soft.  Genitourinary:    Comments: Deferred Musculoskeletal:        General: No swelling or tenderness.  Skin:    General: Skin is warm and dry.  Neurological:     General: No focal  deficit present.     Mental Status: He is alert and oriented to person, place, and time.  Psychiatric:        Mood and Affect: Mood normal.        Behavior: Behavior normal.    Data Reviewed The negative Lexiscan was reviewed, dated 10/23/2020.  Results for Kenneth Bautista, Kenneth Bautista (MRN [id number]) as of 11/09/2020 15:55  Ref. Range 09/20/2012 22:27 08/18/2020 05:52 10/18/2020 18:50  Ethanol Latest Units: mg/dL 295    Ethanol % Latest Ref Range: 0.000 - 0.080 % 0.143 (H)    Amphetamines Latest Ref Range: NONE DETECTED   POSITIVE (A) POSITIVE (A)  Barbiturates Latest Ref Range: NONE DETECTED   NONE DETECTED NONE DETECTED  Benzodiazepines Latest Ref Range: NONE DETECTED   NONE DETECTED NONE DETECTED  Opiates Latest Ref Range: NONE DETECTED   NONE DETECTED NONE DETECTED  COCAINE Latest Ref Range: NONE DETECTED   POSITIVE (A) NONE DETECTED  Tetrahydrocannabinol Latest Ref Range: NONE DETECTED   POSITIVE (A) POSITIVE (A)  He has had positive drug screens for amphetamines, THC, and cocaine.  No imaging has been performed of the area of interest.  I reviewed the PCP note from November 02, 2020, which resulted in his referral to general surgery to evaluate the hernia.  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  There are multiple emergency department visits for atypical chest pain going back several years.  Assessment This is a 35 year old man with an epigastric hernia.  It has become increasingly painful, suggesting possible entrapment of preperitoneal fat.  I have offered him surgical repair.  Of note, he is currently wearing a cardiac monitor and has a history of cocaine associated vasospasm along with multiple cardiology evaluations.  None of these seem to demonstrate acute ischemia or concern for heart failure.  Plan I have offered him surgical repair and discussed the risks of the operation with him.  These include,  but are not limited to, bleeding, infection, hernia recurrence, mesh complications including migration or infection.  He will require a urine drug screen the day of his surgery.  He was also cautioned to avoid lifting, pushing, or pulling anything heavier than 10 pounds for total of 6 weeks from the time of his operation.  We will work on getting him scheduled.  Duanne Guess 11/09/2020, 3:49 PM

## 2020-11-10 ENCOUNTER — Telehealth: Payer: Self-pay | Admitting: General Surgery

## 2020-11-10 NOTE — Telephone Encounter (Signed)
Patient has been advised of Pre-Admission date/time, COVID Testing date and Surgery date.  Surgery Date: 11/15/20 Preadmission Testing Date: 11/14/20 (phone 1p-5p) Covid Testing Date: Not needed.     Patient has been made aware to call [phone], between 1-3:00pm the day before surgery, to find out what time to arrive for surgery.

## 2020-11-14 ENCOUNTER — Other Ambulatory Visit: Payer: Self-pay

## 2020-11-14 ENCOUNTER — Encounter: Payer: Self-pay | Admitting: General Surgery

## 2020-11-14 ENCOUNTER — Other Ambulatory Visit
Admission: RE | Admit: 2020-11-14 | Discharge: 2020-11-14 | Disposition: A | Discharge status: Home / Self Care | Payer: 59 | Source: Ambulatory Visit | Attending: General Surgery | Admitting: General Surgery

## 2020-11-14 ENCOUNTER — Telehealth: Payer: Self-pay | Admitting: *Deleted

## 2020-11-14 HISTORY — DX: Dyspnea, unspecified: R06.00

## 2020-11-14 HISTORY — DX: Pneumonia, unspecified organism: J18.9

## 2020-11-14 NOTE — Telephone Encounter (Signed)
Request for pre-operative cardiac clearance Received: Today Kenneth Kitchens, NP  P Cv Div Preop Callback Request for pre-operative cardiac clearance:     1. What type of surgery is being performed?  OPEN HERNIA REPAIR EPIGASTRIC ADULT   2. When is this surgery scheduled?  11/15/2020     3. Are there any medications that need to be held prior to surgery?  NONE   4. Practice name and name of physician performing surgery?  Performing surgeon: Dr. Fredirick Maudlin, MD  Requesting clearance: Kenneth Loh, FNP-C       5. Anesthesia type (none, local, MAC, general)? General   6. What is the office phone and fax number?    Phone: [phone]  Fax: [phone]   Note: acute add on to OR schedule for tomorrow. I saw where cardiac CTA was being considered for persistent symptoms. In light of multiple recent ED visits resulting in non-invasive CV testing, clearance to proceed is being requested.   ATTENTION: Unable to create telephone message as per your standard workflow. Directed by [facility] providers to send requests for cardiac clearance to this pool for appropriate distribution to provider covering pre-operative clearances.   Kenneth Loh, MSN, APRN, FNP-C, CEN  [facility]  Peri-operative Services Nurse Practitioner  Phone: [phone]  11/14/20 9:23 AM

## 2020-11-14 NOTE — Telephone Encounter (Signed)
Can you please speak to this patient undergoing urgent hernia surgery tomorrow? He has been seen many times in the ED and lastly by Vernona Rieger. Work up is in process with Lexiscan stress test (performed) and currently wearing monitor. Given inconclusive results, I am assuming he will be considered high risk at this point, however this is not prohibitive?   Please send your recommendations to the pre op pool   Thank you

## 2020-11-14 NOTE — Telephone Encounter (Signed)
    Kenneth Bautista DOB:  [birth year]  MRN:  [id number]   Primary Cardiologist: Christell Constant, MD  Chart reviewed as part of pre-operative protocol coverage. Kenneth Bautista would be at high risk to proceed with surgery at this time. He has been seen many times in the ED and lastly by Nada Boozer, NP with [facility]. Work up is in process with Lexiscan stress test (performed) and currently wearing monitor. Given inconclusive results, pre-surgery team asking if cardiology would like to proceed with workup versus proceed knowing that he is at higher risk given ongoing substance abuse. Per conversation with Zella Richer, NP and Dr. Izora Ribas, the patient may proceed with surgery if he is fully aware that he is high risk mainly due to ongoing cocaine and amphetamine use.   I will route this recommendation to the requesting party via Epic fax function and remove from pre-op pool.  Please call with questions.  Georgie Chard, NP 11/14/2020, 3:59 PM

## 2020-11-14 NOTE — Progress Notes (Addendum)
Perioperative Services  Pre-Admission/Anesthesia Testing Clinical Review  Date: 11/14/20  Patient Demographics:  Name: Kenneth Bautista DOB:   [birth year] MRN:   [id number]  Planned Surgical Procedure(s):    Case: [id number] Date/Time: 11/15/20 1043   Procedure: HERNIA REPAIR EPIGASTRIC ADULT, open   Anesthesia type: General   Pre-op diagnosis: epigastric hernia   Location: [facility] OR ROOM 07 / [facility]   Surgeons: Duanne Guessannon, Jennifer, MD     NOTE: Available PAT nursing documentation and vital signs have been reviewed. Clinical nursing staff has updated patient's PMH/PSHx, current medication list, and drug allergies/intolerances to ensure comprehensive history available to assist in medical decision making as it pertains to the aforementioned surgical procedure and anticipated anesthetic course. Extensive review of available clinical information performed. [name] PMH and PSHx updated with any diagnoses/procedures that  may have been inadvertently omitted during his intake with the pre-admission testing department's nursing staff.  Clinical Discussion:  Kenneth Bautista is a 35 y.o. male who is submitted for pre-surgical anesthesia review and clearance prior to him undergoing the above procedure. Patient is a Current Smoker (20 pack years). Pertinent PMH includes: atypical angina, tachycardia, asthma, shortness of breath, GERD (on daily PPI), OA, polysubstance abuse (ETOH, marijuana, amphetamines, and cocaine).  Patient has been referred to general surgery for further evaluation and consideration of surgical intervention for known epigastric hernia.  Patient has been seen in consult by Dr. Duanne GuessJennifer Cannon and surgical repair has been recommended. Risks versus benefits discussed and patient has elected to proceed.   In review of patient's past medical records, it is noted that patient has had 4 visits to the emergency department since 08/2020 for complaints of chest pain  (08/17/20, 08/26/20, 10/04/20, 10/18/20).  ED visit on 08/17/2020 found patient to have elevated hs-TnI of 52 ng/L, which further elevated to 58 ng/L on repeat.  Patient was subsequently admitted for further testing and ongoing monitoring.  Following a normal TTE, patient was discharged home in stable condition with plans to follow-up with outpatient cardiology.  Since that time, patient has been seen by cardiology twice and has undergone further noninvasive studies.  Patient is followed by cardiology Kenneth Bautista(Chandrasekhar, MD). He was last seen in the cardiology clinic on 10/20/2020; notes reviewed.  At the time of his clinic visit, patient reporting persistent chest pain with no other associated anginal equivalent symptoms.  Patient has been seen in the ED the day prior to being seen by cardiology.  He was given a GI cocktail, which helped.  Patient has a history of polysubstance abuse.  Patient reporting that he uses the marijuana to help with his appetite.  During his clinic visit, patient reported that he had not used amphetamines or cocaine that day, citing that these cause his chest pain to be worse.  Patient did complain of his heart racing in clinic.  Blood pressure was well controlled at 98/50; takes no medications.  Patient with history of known epigastric hernia and reflux.  Patient reported that famotidine was ineffective.  H2 blocker was discontinued and patient started on PPI (pantoprazole). Decision was made to send patient for a myocardial perfusion imaging study. No other changes were made to his medication regimen. Patient to follow up with outpatient cardiology after non-invasive study.  Patient has subsequently undergone the ordered nuclear stress imagine on 10/23/2020. Testing demonstrated a small defect of severe severity present in the cardiac apex. LVEF 45-55%. Study determined to be low risk.   Patient is scheduled for  the elective epigastric hernia repair on 11/15/2020 with Dr. Duanne Guess, MD.  Given patient's multiple ED and cardiology visits in the recent past, presurgical cardiac input and clearance was sought by the PAT team.  Communicated with patient's primary cardiologist via secure chat. Per Dr. 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  Given his history of polysubstance abuse, he would be at an HIGH risk for the procedure, however that risk may be significantly lower now if he is now substance free. I do not presently have further ischemic work-up planned at this time.  His study was a low risk study, where the equivocal area is of distal LAD disease. Catheterization or CCTA are high risk given his active cocaine use due to the ability of these procedures to induce vasospasm. Based on everything, his testing has been less suggestive of plaque rupture disease, though he does have risk factors. As long as the patient knows the risk and benefits, it is reasonable to proceed, but his procedure is at higher risk the most 36 year old patients". This patient is not on any type of prescribed anticoagulation/antiplatelet therapies.    Patient denies previous perioperative complications with anesthesia in the past.  In review of the patient's EMR, there are no records available regarding patient's past surgical/anesthetic courses within the [facility] system.   Vitals with BMI 11/09/2020 11/02/2020 10/23/2020  Height 5\' 5"  - 5\' 5"   Weight 144 lbs 137 lbs 137 lbs  BMI 23.96 22.8 22.8  Systolic 144 116 -  Diastolic 77 80 -  Pulse 74 64 -    Providers/Specialists:   NOTE: Primary physician provider listed below. Patient may have been seen by APP or partner within same practice.   PROVIDER ROLE / SPECIALTY LAST , MD  General Surgery 11/09/2020  Patricia Pesa, MD  Primary Care Provider 11/02/2020  Arvilla Market, MD  Cardiology 10/20/2020   Allergies:  Patient  has no known allergies.  Current Home Medications:   No current facility-administered medications for this encounter.    albuterol (VENTOLIN HFA) 108 (90 Base) MCG/ACT inhaler   pantoprazole (PROTONIX) 40 MG tablet   History:   Past Medical History:  Diagnosis Date   Arthritis    Asthma    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HCC)    Dyspnea    Epigastric hernia    GERD (gastroesophageal reflux disease)    History of 2019 novel coronavirus disease (COVID-19) 06/24/2019   Pneumonia    Polysubstance abuse (HCC)    ETOH, marijuana, cocaine, amphetamines   Tachycardia    Past Surgical History:  Procedure Laterality Date   None     Family History  Problem Relation Age of Onset   Heart disease Mother        details unknown   Heart disease Father        details unknown   Social History   Tobacco Use   Smoking status: Every Day    Packs/day: 1.00    Years: 20.00    Pack years: 20.00    Types: Cigarettes   Smokeless tobacco: Never  Vaping Use   Vaping Use: Never used  Substance Use Topics   Alcohol use: Yes    Comment: 1/5th of gin every 1-2 weeks   Drug use: Yes    Types: Marijuana, Cocaine, Amphetamines    Comment: last cocaine use 09/2020    Pertinent Clinical Results:  LABS: Labs reviewed:  Acceptable for surgery.  Lab Results  Component Value Date   WBC 9.2 10/18/2020   HGB 16.2 10/18/2020   HCT 46.5 10/18/2020   MCV 88.9 10/18/2020   PLT 448 (H) 10/18/2020   Lab Results  Component Value Date   NA 131 (L) 10/18/2020   K 3.6 10/18/2020   CO2 22 10/18/2020   GLUCOSE 87 10/18/2020   BUN 9 10/18/2020   CREATININE 1.40 (H) 10/18/2020   CALCIUM 9.7 10/18/2020   GFRNONAA >60 10/18/2020   GFRAA 115 01/28/2020     ECG: Date: 10/20/2020 Time ECG obtained: 1046 AM Rate: 81 bpm Rhythm: normal sinus Axis (leads I and aVF): Normal Intervals: PR 112 ms. QRS 118 ms. QTc 434 ms. ST segment and T wave changes: No evidence of acute ST segment elevation or  depression Comparison: Similar to previous tracing obtained on 10/10/2020   IMAGING / PROCEDURES: LEXISCAN performed on 10/23/2020 Left ventricular ejection fraction is mildly decreased at 45-55% Nuclear stress EF 50% there were no ST segment deviations noted during stress there was a small defect of severe severity present in the apex location Low risk study with apical thinning but no ischemia  Mild left ventricular enlargement  DIAGNOSTIC RADIOGRAPHS CHEST 1 VIEW performed on 10/18/2020 Cardiac shadow within normal limits Lungs are well aerated bilaterally previously seen nodule over the right lung base is not well appreciated was likely related to a nipple shadow No sizable effusion noted No bony abnormality is seen overall, no acute abnormalities noted  TRANSTHORACIC ECHOCARDIOGRAM performed on 08/18/2020 Left ventricular ejection fraction by estimation is 55 to 60% Left ventricular systolic function normal No regional wall motion abnormalities Left ventricular diastolic parameters normal Right ventricular systolic function and size normal Tricuspid regurgitation signal is not adequate for assessing PA pressure No mitral valve stenosis.  Mitral valve normal in structure.  No evidence of regurgitation Aortic valve is tricuspid.  Aortic valve regurgitation is not visualized.  No aortic valve stenosis The inferior vena cava is normal in size with greater than 50% respiratory variability, suggesting right atrial pressure of 3 mmHg  Impression and Plan:  ROLLA KEDZIERSKI has been referred for pre-anesthesia review and clearance prior to him undergoing the planned anesthetic and procedural courses. Available labs, pertinent testing, and imaging results were personally reviewed by me. This patient has been appropriately cleared by cardiology with an overall HIGH risk of significant perioperative cardiovascular complications, mainly due to the patient's known polysubstance abuse. Cardiologist  notes that "the only way to reduce current risk of complications is for him to remain drug/substance free". With that being said, patient was made aware of the fact that urine drug screen would be performed prior to his procedure on 11/15/2020.  It was stressed to him that if UDS resulted as (+), then his procedure would be cancelled.  Based on clinical review performed today (11/14/20), barring any significant acute changes in the patient's overall condition, it is anticipated that he will be able to proceed with the planned surgical intervention. Any acute changes in clinical condition may necessitate his procedure being postponed and/or cancelled. Patient will meet with anesthesia team (MD and/or CRNA) on the day of his procedure for preoperative evaluation/assessment. Questions regarding anesthetic course will be fielded at that time.   Pre-surgical instructions were reviewed with the patient during his PAT appointment and questions were fielded by PAT clinical staff. Patient was advised that if any questions or concerns arise prior to his procedure then he should return a  call to PAT and/or his surgeon's office to discuss.  Quentin Mulling, MSN, APRN, FNP-C, CEN [facility]  Peri-operative Services Nurse Practitioner Phone: [phone] 11/14/20 4:28 PM  NOTE: This note has been prepared using Dragon dictation software. Despite my best ability to proofread, there is always the potential that unintentional transcriptional errors may still occur from this process.

## 2020-11-14 NOTE — Patient Instructions (Addendum)
Your procedure is scheduled on: 11/15/20 Report to the Registration Desk on the 1st floor of the [facility]. To find out your arrival time, please call [phone] between 1PM - 3PM on: 11/14/20  REMEMBER: Instructions that are not followed completely may result in serious medical risk, up to and including death; or upon the discretion of your surgeon and anesthesiologist your surgery may need to be rescheduled.  Do not eat food or drink any fluids after midnight the night before surgery.  No gum chewing, lozengers or hard candies.   TAKE THESE MEDICATIONS THE MORNING OF SURGERY WITH A SIP OF WATER:   - pantoprazole (PROTONIX) 40 MG tablet , take one the night before and one on the morning of surgery - helps to prevent nausea after surgery.  Use inhaler albuterol (VENTOLIN HFA) 108 (90 Base) MCG/ACT inhaleron the day of surgery and bring to the hospital.  One week prior to surgery: Stop Anti-inflammatories (NSAIDS) such as Advil, Aleve, Ibuprofen, Motrin, Naproxen, Naprosyn and Aspirin based products such as Excedrin, Goodys Powder, BC Powder.  Stop ANY OVER THE COUNTER supplements until after surgery.  You may take Tylenol if needed for pain up until the day of surgery.  No Alcohol for 24 hours before or after surgery.  No Smoking including e-cigarettes for 24 hours prior to surgery.  No chewable tobacco products for at least 6 hours prior to surgery.  No nicotine patches on the day of surgery.  Do not use any "recreational" drugs for at least a week prior to your surgery.  Please be advised that the combination of cocaine and anesthesia may have negative outcomes, up to and including death. If you test positive for cocaine, your surgery will be cancelled.  On the morning of surgery brush your teeth with toothpaste and water, you may rinse your mouth with mouthwash if you wish. Do not swallow any toothpaste or mouthwash.  Do not wear jewelry, make-up, hairpins, clips or  nail polish.  Do not wear lotions, powders, or perfumes.   Do not shave body from the neck down 48 hours prior to surgery just in case you cut yourself which could leave a site for infection.  Also, freshly shaved skin may become irritated if using the CHG soap.  Contact lenses, hearing aids and dentures may not be worn into surgery.  Do not bring valuables to the hospital. [facility] is not responsible for any missing/lost belongings or valuables.   Notify your doctor if there is any change in your medical condition (cold, fever, infection).  Wear comfortable clothing (specific to your surgery type) to the hospital.  After surgery, you can help prevent lung complications by doing breathing exercises.  Take deep breaths and cough every 1-2 hours. Your doctor may order a device called an Incentive Spirometer to help you take deep breaths. When coughing or sneezing, hold a pillow firmly against your incision with both hands. This is called "splinting." Doing this helps protect your incision. It also decreases belly discomfort.  If you are being admitted to the hospital overnight, leave your suitcase in the car. After surgery it may be brought to your room.  If you are being discharged the day of surgery, you will not be allowed to drive home. You will need a responsible adult (18 years or older) to drive you home and stay with you that night.   If you are taking public transportation, you will need to have a responsible adult (18 years or older)  with you. Please confirm with your physician that it is acceptable to use public transportation.   Please call the Pre-admissions Testing Dept. at [phone] if you have any questions about these instructions.  Surgery Visitation Policy:  Patients undergoing a surgery or procedure may have one family member or support person with them as long as that person is not COVID-19 positive or experiencing its symptoms.  That person may remain in  the waiting area during the procedure.  Inpatient Visitation:    Visiting hours are 7 a.m. to 8 p.m. Inpatients will be allowed two visitors daily. The visitors may change each day during the patient's stay. No visitors under the age of 57. Any visitor under the age of 87 must be accompanied by an adult. The visitor must pass COVID-19 screenings, use hand sanitizer when entering and exiting the patient's room and wear a mask at all times, including in the patient's room. Patients must also wear a mask when staff or their visitor are in the room. Masking is required regardless of vaccination status.

## 2020-11-15 ENCOUNTER — Ambulatory Visit: Payer: 59 | Admitting: Urgent Care

## 2020-11-15 ENCOUNTER — Ambulatory Visit
Admission: RE | Admit: 2020-11-15 | Discharge: 2020-11-15 | Disposition: A | Discharge status: Home / Self Care | Payer: 59 | Attending: General Surgery | Admitting: General Surgery

## 2020-11-15 ENCOUNTER — Encounter
Admission: RE | Disposition: A | Discharge status: Home / Self Care | Payer: Self-pay | Source: Home / Self Care | Attending: General Surgery

## 2020-11-15 ENCOUNTER — Encounter: Payer: Self-pay | Admitting: General Surgery

## 2020-11-15 DIAGNOSIS — F17210 Nicotine dependence, cigarettes, uncomplicated: Secondary | ICD-10-CM | POA: Diagnosis not present

## 2020-11-15 DIAGNOSIS — Z79899 Other long term (current) drug therapy: Secondary | ICD-10-CM | POA: Diagnosis not present

## 2020-11-15 DIAGNOSIS — K436 Other and unspecified ventral hernia with obstruction, without gangrene: Secondary | ICD-10-CM | POA: Diagnosis not present

## 2020-11-15 DIAGNOSIS — K439 Ventral hernia without obstruction or gangrene: Secondary | ICD-10-CM

## 2020-11-15 HISTORY — DX: Gastro-esophageal reflux disease without esophagitis: K21.9

## 2020-11-15 HISTORY — DX: Ventral hernia without obstruction or gangrene: K43.9

## 2020-11-15 HISTORY — DX: Tachycardia, unspecified: R00.0

## 2020-11-15 HISTORY — DX: Other psychoactive substance abuse, uncomplicated: F19.10

## 2020-11-15 HISTORY — PX: EPIGASTRIC HERNIA REPAIR: SHX404

## 2020-11-15 HISTORY — DX: Other forms of angina pectoris: I20.89

## 2020-11-15 HISTORY — DX: Other forms of angina pectoris: I20.8

## 2020-11-15 LAB — URINE DRUG SCREEN, QUALITATIVE (ARMC ONLY)
Amphetamines, Ur Screen: NOT DETECTED
Barbiturates, Ur Screen: NOT DETECTED
Benzodiazepine, Ur Scrn: NOT DETECTED
Cannabinoid 50 Ng, Ur ~~LOC~~: NOT DETECTED
Cocaine Metabolite,Ur ~~LOC~~: NOT DETECTED
MDMA (Ecstasy)Ur Screen: NOT DETECTED
Methadone Scn, Ur: NOT DETECTED
Opiate, Ur Screen: NOT DETECTED
Phencyclidine (PCP) Ur S: NOT DETECTED
Tricyclic, Ur Screen: NOT DETECTED

## 2020-11-15 SURGERY — REPAIR, HERNIA, EPIGASTRIC, ADULT
Anesthesia: General

## 2020-11-15 MED ORDER — IBUPROFEN 800 MG PO TABS: 800.0000 mg | ORAL_TABLET | Freq: Three times a day (TID) | ORAL | 0 refills | Status: DC | PRN

## 2020-11-15 MED ORDER — DEXAMETHASONE SODIUM PHOSPHATE 10 MG/ML IJ SOLN
INTRAMUSCULAR | Status: DC | PRN
  Administered 2020-11-15: 10 mg via INTRAVENOUS

## 2020-11-15 MED ORDER — CELECOXIB 200 MG PO CAPS
ORAL_CAPSULE | ORAL | Status: AC
  Filled 2020-11-15: qty 1

## 2020-11-15 MED ORDER — CELECOXIB 200 MG PO CAPS
200.0000 mg | ORAL_CAPSULE | ORAL | Status: AC
  Administered 2020-11-15: 200 mg via ORAL

## 2020-11-15 MED ORDER — MEPERIDINE HCL 25 MG/ML IJ SOLN: 6.2500 mg | INTRAMUSCULAR | Status: DC | PRN

## 2020-11-15 MED ORDER — LIDOCAINE-EPINEPHRINE 1 %-1:100000 IJ SOLN
INTRAMUSCULAR | Status: DC | PRN
  Administered 2020-11-15: 9 mL via SUBCUTANEOUS

## 2020-11-15 MED ORDER — BUPIVACAINE LIPOSOME 1.3 % IJ SUSP
INTRAMUSCULAR | Status: DC | PRN
  Administered 2020-11-15: 20 mL

## 2020-11-15 MED ORDER — ONDANSETRON HCL 4 MG/2ML IJ SOLN: 4.0000 mg | Freq: Once | INTRAMUSCULAR | Status: DC | PRN

## 2020-11-15 MED ORDER — CEFAZOLIN SODIUM-DEXTROSE 2-4 GM/100ML-% IV SOLN
INTRAVENOUS | Status: AC
  Filled 2020-11-15: qty 100

## 2020-11-15 MED ORDER — LIDOCAINE-EPINEPHRINE 1 %-1:100000 IJ SOLN
INTRAMUSCULAR | Status: AC
  Filled 2020-11-15: qty 1

## 2020-11-15 MED ORDER — ONDANSETRON HCL 4 MG/2ML IJ SOLN
INTRAMUSCULAR | Status: DC | PRN
  Administered 2020-11-15: 4 mg via INTRAVENOUS

## 2020-11-15 MED ORDER — CHLORHEXIDINE GLUCONATE CLOTH 2 % EX PADS: 6.0000 | MEDICATED_PAD | Freq: Once | CUTANEOUS | Status: DC

## 2020-11-15 MED ORDER — ACETAMINOPHEN 500 MG PO TABS: 1000.0000 mg | ORAL_TABLET | Freq: Four times a day (QID) | ORAL | 0 refills | Status: AC

## 2020-11-15 MED ORDER — BUPIVACAINE HCL (PF) 0.25 % IJ SOLN
INTRAMUSCULAR | Status: AC
  Filled 2020-11-15: qty 30

## 2020-11-15 MED ORDER — KETAMINE HCL 10 MG/ML IJ SOLN
INTRAMUSCULAR | Status: DC | PRN
  Administered 2020-11-15: 20 mg via INTRAVENOUS

## 2020-11-15 MED ORDER — FENTANYL CITRATE (PF) 100 MCG/2ML IJ SOLN
INTRAMUSCULAR | Status: AC
  Administered 2020-11-15: 25 ug via INTRAVENOUS
  Filled 2020-11-15: qty 2

## 2020-11-15 MED ORDER — CHLORHEXIDINE GLUCONATE 0.12 % MT SOLN
OROMUCOSAL | Status: AC
  Filled 2020-11-15: qty 15

## 2020-11-15 MED ORDER — GABAPENTIN 300 MG PO CAPS
300.0000 mg | ORAL_CAPSULE | ORAL | Status: AC
Start: 2020-11-15 — End: 2020-11-15

## 2020-11-15 MED ORDER — LIDOCAINE HCL (CARDIAC) PF 100 MG/5ML IV SOSY
PREFILLED_SYRINGE | INTRAVENOUS | Status: DC | PRN
  Administered 2020-11-15: 100 mg via INTRAVENOUS

## 2020-11-15 MED ORDER — FENTANYL CITRATE (PF) 100 MCG/2ML IJ SOLN
INTRAMUSCULAR | Status: AC
  Filled 2020-11-15: qty 2

## 2020-11-15 MED ORDER — MIDAZOLAM HCL 2 MG/2ML IJ SOLN
INTRAMUSCULAR | Status: AC
  Filled 2020-11-15: qty 2

## 2020-11-15 MED ORDER — FENTANYL CITRATE (PF) 100 MCG/2ML IJ SOLN
INTRAMUSCULAR | Status: DC | PRN
  Administered 2020-11-15: 50 ug via INTRAVENOUS

## 2020-11-15 MED ORDER — MIDAZOLAM HCL 2 MG/2ML IJ SOLN
INTRAMUSCULAR | Status: DC | PRN
  Administered 2020-11-15: 2 mg via INTRAVENOUS

## 2020-11-15 MED ORDER — KETAMINE HCL 50 MG/ML IJ SOLN
INTRAMUSCULAR | Status: AC
  Filled 2020-11-15: qty 1

## 2020-11-15 MED ORDER — ACETAMINOPHEN 500 MG PO TABS
ORAL_TABLET | ORAL | Status: AC
  Administered 2020-11-15: 1000 mg via ORAL
  Filled 2020-11-15: qty 2

## 2020-11-15 MED ORDER — EPHEDRINE SULFATE 50 MG/ML IJ SOLN
INTRAMUSCULAR | Status: DC | PRN
  Administered 2020-11-15: 5 mg via INTRAVENOUS
  Administered 2020-11-15: 10 mg via INTRAVENOUS

## 2020-11-15 MED ORDER — GLYCOPYRROLATE 0.2 MG/ML IJ SOLN
INTRAMUSCULAR | Status: DC | PRN
  Administered 2020-11-15: .2 mg via INTRAVENOUS

## 2020-11-15 MED ORDER — LACTATED RINGERS IV SOLN: INTRAVENOUS | Status: DC

## 2020-11-15 MED ORDER — SUGAMMADEX SODIUM 200 MG/2ML IV SOLN
INTRAVENOUS | Status: DC | PRN
  Administered 2020-11-15: 200 mg via INTRAVENOUS

## 2020-11-15 MED ORDER — CEFAZOLIN SODIUM-DEXTROSE 2-4 GM/100ML-% IV SOLN: 2.0000 g | INTRAVENOUS | Status: DC

## 2020-11-15 MED ORDER — BUPIVACAINE LIPOSOME 1.3 % IJ SUSP
INTRAMUSCULAR | Status: AC
  Filled 2020-11-15: qty 20

## 2020-11-15 MED ORDER — BUPIVACAINE LIPOSOME 1.3 % IJ SUSP
20.0000 mL | Freq: Once | INTRAMUSCULAR | Status: DC
Start: 2020-11-15 — End: 2020-11-15

## 2020-11-15 MED ORDER — CHLORHEXIDINE GLUCONATE CLOTH 2 % EX PADS
6.0000 | MEDICATED_PAD | Freq: Once | CUTANEOUS | Status: AC
  Administered 2020-11-15: 6 via TOPICAL

## 2020-11-15 MED ORDER — CHLORHEXIDINE GLUCONATE 0.12 % MT SOLN
15.0000 mL | Freq: Once | OROMUCOSAL | Status: AC
  Administered 2020-11-15: 15 mL via OROMUCOSAL

## 2020-11-15 MED ORDER — GABAPENTIN 300 MG PO CAPS
ORAL_CAPSULE | ORAL | Status: AC
  Administered 2020-11-15: 300 mg via ORAL
  Filled 2020-11-15: qty 1

## 2020-11-15 MED ORDER — ACETAMINOPHEN 500 MG PO TABS: 1000.0000 mg | ORAL_TABLET | ORAL | Status: AC

## 2020-11-15 MED ORDER — PROPOFOL 10 MG/ML IV BOLUS
INTRAVENOUS | Status: AC
  Filled 2020-11-15: qty 20

## 2020-11-15 MED ORDER — ROCURONIUM BROMIDE 100 MG/10ML IV SOLN
INTRAVENOUS | Status: DC | PRN
  Administered 2020-11-15: 20 mg via INTRAVENOUS
  Administered 2020-11-15: 50 mg via INTRAVENOUS

## 2020-11-15 MED ORDER — OXYCODONE HCL 5 MG PO TABS: 5.0000 mg | ORAL_TABLET | Freq: Four times a day (QID) | ORAL | 0 refills | Status: DC | PRN

## 2020-11-15 MED ORDER — ORAL CARE MOUTH RINSE: 15.0000 mL | Freq: Once | OROMUCOSAL | Status: AC

## 2020-11-15 MED ORDER — PROPOFOL 10 MG/ML IV BOLUS
INTRAVENOUS | Status: DC | PRN
  Administered 2020-11-15: 150 mg via INTRAVENOUS

## 2020-11-15 MED ORDER — FENTANYL CITRATE (PF) 100 MCG/2ML IJ SOLN
25.0000 ug | INTRAMUSCULAR | Status: DC | PRN
  Administered 2020-11-15: 25 ug via INTRAVENOUS

## 2020-11-15 SURGICAL SUPPLY — 38 items
ADH SKN CLS APL DERMABOND .7 (GAUZE/BANDAGES/DRESSINGS) ×1
APL PRP STRL LF DISP 70% ISPRP (MISCELLANEOUS) ×1
BINDER ABDOMINAL  9 SM 30-45 (SOFTGOODS) ×1
BINDER ABDOMINAL 9 SM 30-45 (SOFTGOODS) ×1 IMPLANT
BLADE SURG 15 STRL LF DISP TIS (BLADE) ×1 IMPLANT
BLADE SURG 15 STRL SS (BLADE) ×2
CANISTER SUCT 1200ML W/VALVE (MISCELLANEOUS) ×2 IMPLANT
CHLORAPREP W/TINT 26 (MISCELLANEOUS) ×2 IMPLANT
COVER WAND RF STERILE (DRAPES) ×2 IMPLANT
DERMABOND ADVANCED (GAUZE/BANDAGES/DRESSINGS) ×1
DERMABOND ADVANCED .7 DNX12 (GAUZE/BANDAGES/DRESSINGS) ×1 IMPLANT
DRAIN PENROSE 0.625X18 (DRAIN) ×2 IMPLANT
DRAPE LAPAROTOMY 77X122 PED (DRAPES) ×2 IMPLANT
ELECT CAUTERY BLADE TIP 2.5 (TIP) ×2
ELECT REM PT RETURN 9FT ADLT (ELECTROSURGICAL) ×2
ELECTRODE CAUTERY BLDE TIP 2.5 (TIP) ×1 IMPLANT
ELECTRODE REM PT RTRN 9FT ADLT (ELECTROSURGICAL) ×1 IMPLANT
GLOVE SURG ENC MOIS LTX SZ6.5 (GLOVE) ×2 IMPLANT
GLOVE SURG UNDER LTX SZ7 (GLOVE) ×4 IMPLANT
GOWN STRL REUS W/ TWL LRG LVL3 (GOWN DISPOSABLE) ×2 IMPLANT
GOWN STRL REUS W/TWL LRG LVL3 (GOWN DISPOSABLE) ×4
KIT TURNOVER KIT A (KITS) ×2 IMPLANT
LABEL OR SOLS (LABEL) ×2 IMPLANT
MANIFOLD NEPTUNE II (INSTRUMENTS) ×2 IMPLANT
NEEDLE HYPO 22GX1.5 SAFETY (NEEDLE) ×2 IMPLANT
NS IRRIG 500ML POUR BTL (IV SOLUTION) ×2 IMPLANT
PACK BASIN MINOR ARMC (MISCELLANEOUS) ×2 IMPLANT
STRIP CLOSURE SKIN 1/2X4 (GAUZE/BANDAGES/DRESSINGS) ×2 IMPLANT
SUT ETHIBOND NAB MO 7 #0 18IN (SUTURE) ×2 IMPLANT
SUT MNCRL 4-0 (SUTURE) ×2
SUT MNCRL 4-0 27XMFL (SUTURE) ×1
SUT VIC AB 3-0 SH 27 (SUTURE) ×2
SUT VIC AB 3-0 SH 27X BRD (SUTURE) ×1 IMPLANT
SUT VICRYL 2-0 SH 8X27 (SUTURE) ×2 IMPLANT
SUTURE MNCRL 4-0 27XMF (SUTURE) ×1 IMPLANT
SYR 10ML LL (SYRINGE) ×2 IMPLANT
SYR 20ML LL LF (SYRINGE) ×2 IMPLANT
SYR BULB IRRIG 60ML STRL (SYRINGE) ×2 IMPLANT

## 2020-11-15 NOTE — Interval H&P Note (Signed)
History and Physical Interval Note:  11/15/2020 12:43 PM  Kenneth Bautista  has presented today for surgery, with the diagnosis of epigastric hernia.  The various methods of treatment have been discussed with the patient and family. After consideration of risks, benefits and other options for treatment, the patient has consented to  Procedure(s): HERNIA REPAIR EPIGASTRIC ADULT, open (N/A) as a surgical intervention.  The patient's history has been reviewed, patient examined, no change in status, stable for surgery.  I have reviewed the patient's chart and labs.  Questions were answered to the patient's satisfaction.     Duanne Guess

## 2020-11-15 NOTE — Anesthesia Postprocedure Evaluation (Signed)
Anesthesia Post Note  Patient: Kenneth Bautista SURGEON  Procedure(s) Performed: HERNIA REPAIR EPIGASTRIC ADULT, open  Patient location during evaluation: PACU Anesthesia Type: General Level of consciousness: awake and alert, awake and oriented Pain management: pain level controlled Vital Signs Assessment: post-procedure vital signs reviewed and stable Respiratory status: spontaneous breathing, nonlabored ventilation and respiratory function stable Cardiovascular status: blood pressure returned to baseline and stable Postop Assessment: no apparent nausea or vomiting Anesthetic complications: no   No notable events documented.   Last Vitals:  Vitals:   11/15/20 1415 11/15/20 1430  BP: 99/63 133/86  Pulse: (!) 49 67  Resp: 13 18  Temp:    SpO2: 99% 99%    Last Pain:  Vitals:   11/15/20 1430  TempSrc:   PainSc: 6                  Manfred Arch

## 2020-11-15 NOTE — Anesthesia Procedure Notes (Signed)
Procedure Name: Intubation Date/Time: 11/15/2020 12:58 PM Performed by: Cheral Bay, CRNA Pre-anesthesia Checklist: Patient identified, Emergency Drugs available, Suction available and Patient being monitored Patient Re-evaluated:Patient Re-evaluated prior to induction Oxygen Delivery Method: Circle system utilized Preoxygenation: Pre-oxygenation with 100% oxygen Induction Type: IV induction Ventilation: Mask ventilation without difficulty Laryngoscope Size: McGraph and 3 Tube type: Oral Number of attempts: 1 Airway Equipment and Method: Stylet Placement Confirmation: ETT inserted through vocal cords under direct vision, positive ETCO2 and breath sounds checked- equal and bilateral Secured at: 21 cm Tube secured with: Tape Dental Injury: Teeth and Oropharynx as per pre-operative assessment

## 2020-11-15 NOTE — Op Note (Signed)
Operative Note  Preoperative Diagnosis: Epigastric hernia  Postoperative Diagnosis: Strangulated epigastric hernia  Operation: Open primary repair of strangulated epigastric hernia  Surgeon: Duanne Guess, MD  Assistant: None  Anesthesia: General endotracheal  Findings: Strangulated preperitoneal fat with in a 0.75 cm fascial defect.  Indications: This is a 35 year old man who has had a small epigastric hernia for most of his life.  It is recently become larger and more painful.  He was interested in surgical repair.  The risks were discussed with him and he expressed a desire to proceed.  Procedure In Detail: The patient was identified in the preoperative holding area and brought to the operating room where he was placed supine on the OR table.  Bony prominences were padded and bilateral sequential compression devices were placed on the lower extremities.  General endotracheal anesthesia was induced without incident.  The patient was positioned appropriately for the operation and sterilely prepped and draped in standard fashion.  A timeout was performed confirming the patient's identity, the procedure being performed, his allergies, all necessary equipment was available, and that maintenance anesthesia was adequate.  Perioperative antibiotics were administered.  The skin overlying the hernia was infiltrated with a one-to-one mixture of 0.25% bupivacaine and 1% lidocaine with epinephrine.  An incision was then made overlying the hernia.  This was carried down through the subcutaneous tissues using electrocautery.  The hernia sac was identified.  It was circumferentially dissected away from the surrounding tissues down to the fascia.  The fascial defect was identified.  The hernia sac was excised.  Due to the large amount of strangulated fat, I was not able to return all of the contents into the abdomen.  A Kelly clamp was placed across the base of the fat and the excess excised.  The fat was  then ligated with a 3-0 Vicryl suture and returned to the abdominal cavity.  Due to the small size of the defect, I elected not to use mesh.  The defect was primarily repaired using interrupted 0 Ethibond suture.  The wound was irrigated and hemostasis confirmed.  Liposomal bupivacaine was then injected along the fascial planes.  The deep dermal layer was reapproximated with interrupted 3-0 Vicryl and the skin was closed with running subcuticular Monocryl.  The skin was cleaned.  Dermabond and Steri-Strips were applied.  Abdominal binder was placed and the patient was awakened, extubated, and taken to the postanesthesia care unit in good condition.  EBL: Less than 5 cc  IVF: See anesthesia record  Specimen(s): None  Complications: none immediately apparent.   Counts: all needles, instruments, and sponges were counted and reported to be correct in number at the end of the case.   I was present for and participated in the entire operation.  Duanne Guess 2:10 PM

## 2020-11-15 NOTE — Anesthesia Preprocedure Evaluation (Addendum)
Anesthesia Evaluation  Patient identified by MRN, date of birth, ID band Patient awake    Reviewed: Allergy & Precautions, NPO status , Patient's Chart, lab work & pertinent test results  Airway Mallampati: II  TM Distance: >3 FB Neck ROM: Full    Dental  (+) Poor Dentition, Chipped, Missing   Pulmonary shortness of breath and with exertion, asthma , pneumonia, resolved, Current Smoker and Patient abstained from smoking.,    Pulmonary exam normal        Cardiovascular + angina Normal cardiovascular exam  See Cardiology Comments   Neuro/Psych PSYCHIATRIC DISORDERS Anxiety negative neurological ROS     GI/Hepatic GERD  Medicated,(+)     substance abuse  cocaine use and methamphetamine use,   Endo/Other  negative endocrine ROS  Renal/GU negative Renal ROS  negative genitourinary   Musculoskeletal  (+) Arthritis ,   Abdominal   Peds negative pediatric ROS (+)  Hematology negative hematology ROS (+)   Anesthesia Other Findings Arthritis    Asthma    Substance abuse (HCC) The left ventricular ejection fraction is mildly decreased (45-54%). Nuclear stress EF: 50%. There was no ST segment deviation noted during stress. Defect 1: There is a small defect of severe severity present in the apex location.   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 Dr. Izora Ribas, the patient may proceed with surgery if he is fully aware that he is high risk mainly due to ongoing cocaine and amphetamine use.   Drug Screen Neg         Reproductive/Obstetrics negative OB ROS                           Anesthesia Physical Anesthesia Plan  ASA: 3  Anesthesia Plan: General   Post-op Pain Management:    Induction: Intravenous  PONV Risk Score and Plan: 2  Airway Management Planned: Oral ETT  Additional Equipment:   Intra-op Plan:    Post-operative Plan: Extubation in OR  Informed Consent: I have reviewed the patients History and Physical, chart, labs and discussed the procedure including the risks, benefits and alternatives for the proposed anesthesia with the patient or authorized representative who has indicated his/her understanding and acceptance.       Plan Discussed with: CRNA and Anesthesiologist  Anesthesia Plan Comments:         Anesthesia Quick Evaluation

## 2020-11-15 NOTE — Discharge Instructions (Signed)

## 2020-11-15 NOTE — Transfer of Care (Signed)
Immediate Anesthesia Transfer of Care Note  Patient: Kenneth Bautista  Procedure(s) Performed: HERNIA REPAIR EPIGASTRIC ADULT, open  Patient Location: PACU  Anesthesia Type:General  Level of Consciousness: drowsy  Airway & Oxygen Therapy o2 via facemask  Post-op Assessment: Report given to RN and Post -op Vital signs reviewed and stable  Post vital signs: stable  Last Vitals:  Vitals Value Taken Time  BP 110/68 11/15/20 1403  Temp    Pulse 49 11/15/20 1406  Resp 11 11/15/20 1406  SpO2 99 % 11/15/20 1406  Vitals shown include unvalidated device data.  Last Pain:  Vitals:   11/15/20 0846  TempSrc: Tympanic  PainSc: 6          Complications: No notable events documented.

## 2020-11-16 ENCOUNTER — Encounter: Payer: Self-pay | Admitting: General Surgery

## 2020-11-20 ENCOUNTER — Other Ambulatory Visit: Payer: Self-pay | Admitting: Surgery

## 2020-11-20 ENCOUNTER — Telehealth: Payer: Self-pay | Admitting: General Surgery

## 2020-11-20 MED ORDER — OXYCODONE HCL 5 MG PO TABS: 5.0000 mg | ORAL_TABLET | Freq: Four times a day (QID) | ORAL | 0 refills | Status: DC | PRN

## 2020-11-20 NOTE — Telephone Encounter (Signed)
Patient is calling and is complaining of being in pain, with a level 10 patient is asking for a refill on his oxycodone. Please call patient and advise.

## 2020-11-20 NOTE — Telephone Encounter (Signed)
Epigastric pain from surgery no difficulty swallowing-denies fever-patient stated he has not had a bowel movement since surgery-instructed patient to begin Miralax today and everyday until normal bowel movements-drink plenty of fluids-continue the ibuprofen and Oxycodone will be sent to pharmacy per Dr.Piscoya.-patient added to Dr.Cannon's schedule for 11/23/20

## 2020-11-23 ENCOUNTER — Encounter: Payer: Self-pay | Admitting: General Surgery

## 2020-11-23 ENCOUNTER — Ambulatory Visit (INDEPENDENT_AMBULATORY_CARE_PROVIDER_SITE_OTHER): Payer: 59 | Admitting: General Surgery

## 2020-11-23 ENCOUNTER — Other Ambulatory Visit: Payer: Self-pay

## 2020-11-23 VITALS — BP 136/74 | HR 63 | Temp 98.3°F | Ht 66.0 in | Wt 142.2 lb

## 2020-11-23 DIAGNOSIS — Z09 Encounter for follow-up examination after completed treatment for conditions other than malignant neoplasm: Secondary | ICD-10-CM

## 2020-11-23 NOTE — Patient Instructions (Addendum)
For pain, you may take Ibuprofen (up to 3 tabs 200 mg = 600 mg) and alternate with Tylenol (up to 2 tabs) every 3 hours.   Follow up with your Cardiologist.   If you have any concerns or questions, please feel free to call our office.    GENERAL POST-OPERATIVE PATIENT INSTRUCTIONS   WOUND CARE INSTRUCTIONS:  Keep a dry clean dressing on the wound if there is drainage. The initial bandage may be removed after 24 hours.  Once the wound has quit draining you may leave it open to air.  If clothing rubs against the wound or causes irritation and the wound is not draining you may cover it with a dry dressing during the daytime.  Try to keep the wound dry and avoid ointments on the wound unless directed to do so.  If the wound becomes bright red and painful or starts to drain infected material that is not clear, please contact your physician immediately.  If the wound is mildly pink and has a thick firm ridge underneath it, this is normal, and is referred to as a healing ridge.  This will resolve over the next 4-6 weeks.  BATHING: You may shower if you have been informed of this by your surgeon. However, Please do not submerge in a tub, hot tub, or pool until incisions are completely sealed or have been told by your surgeon that you may do so.  DIET:  You may eat any foods that you can tolerate.  It is a good idea to eat a high fiber diet and take in plenty of fluids to prevent constipation.  If you do become constipated you may want to take a mild laxative or take ducolax tablets on a daily basis until your bowel habits are regular.  Constipation can be very uncomfortable, along with straining, after recent surgery.  ACTIVITY:  You are encouraged to cough and deep breath or use your incentive spirometer if you were given one, every 15-30 minutes when awake.  This will help prevent respiratory complications and low grade fevers post-operatively if you had a general anesthetic.  You may want to hug a pillow  when coughing and sneezing to add additional support to the surgical area, if you had abdominal or chest surgery, which will decrease pain during these times.  You are encouraged to walk and engage in light activity for the next two weeks.  You should not lift more than 10 pounds, 12/27/2020 as it could put you at increased risk for complications.  Twenty pounds is roughly equivalent to a plastic bag of groceries. At that time- Listen to your body when lifting, if you have pain when lifting, stop and then try again in a few days. Soreness after doing exercises or activities of daily living is normal as you get back in to your normal routine.  MEDICATIONS:  Try to take narcotic medications and anti-inflammatory medications, such as tylenol, ibuprofen, naprosyn, etc., with food.  This will minimize stomach upset from the medication.  Should you develop nausea and vomiting from the pain medication, or develop a rash, please discontinue the medication and contact your physician.  You should not drive, make important decisions, or operate machinery when taking narcotic pain medication.  SUNBLOCK Use sun block to incision area over the next year if this area will be exposed to sun. This helps decrease scarring and will allow you avoid a permanent darkened area over your incision.

## 2020-11-23 NOTE — Progress Notes (Signed)
Kenneth Bautista is here today for a postoperative visit.  He is a 35 year old man who had an open epigastric hernia repair on November 15, 2020.  He apparently went through his oxycodone and less than 5 days and received a refill on November 20, 2020.  He continues to complain of severe pain, but indicates that this is primarily in his chest and radiating into his left arm.  He is endorsing nausea and feeling lightheaded.  He denies any vomiting, fevers, or chills.  He has having normal bowel movements and eating a regular diet.  He denies any recent use of amphetamines or cocaine, but does endorse significant marijuana use.  Today's Vitals   11/23/20 0929  BP: 136/74  Pulse: 63  Temp: 98.3 F (36.8 C)  TempSrc: Oral  SpO2: 96%  Weight: 142 lb 3.2 oz (64.5 kg)  Height: 5\' 6"  (1.676 m)   Body mass index is 22.95 kg/m. Focused abdominal examination: The Steri-Strips were removed from the surgical site.  There is no erythema, induration, or drainage.  There is a slight skin separation at the top.  I applied a new dressing.  Impression and plan: This is a 35 year old man who had an epigastric hernia repair.  The incision was quite small and I cannot account for his significant complaints of pain, particularly the chest pain, nausea, and lightheadedness as being related to his surgery.  He has been under the care of cardiology and I have advised him to contact his heart doctor immediately upon the termination of our visit today.  I recommended that he alternate ibuprofen and Tylenol on an every 3 hours basis.  I will see him as needed.

## 2020-11-30 ENCOUNTER — Encounter: Payer: 59 | Admitting: General Surgery

## 2020-12-04 NOTE — Progress Notes (Deleted)
Cardiology Office Note:    Date:  12/04/2020   ID:  Kenneth Bautista, DOB 12-20-1985, MRN [id number]  PCP:  Arvilla Market, MD   [facility]  Cardiologist:  Christell Constant, MD  Advanced Practice Provider:  No care team member to display Electrophysiologist:  None    { Click here to update then REFRESH NOTE - MD (PCP) or APP (Team Member)  Change PCP Type for MD, Specialty for APP is either Cardiology or Clinical Cardiac Electrophysiology  :[id number]}   CC: Follow up chest pain and post surgery.  History of Present Illness:    Kenneth Bautista is a 35 y.o. male with a hx of arthritis, Asthma, Recent Cocaine Abuse, Recent urine tox positive for Amphetamines, Tobacco Abuse, and alcohol abuse.  Initially presented 08/17/20 with CP.  Normal Echo at that time.  Seen 08/26/20 with similar presentation, but amphetamines in testing, thought patient denies [name]. Presenting 09/05/20 with similar discomfort.  In interim of this visit, patient notes that he had un-eventful surgery 11/23/20.  Is not polysubstance free.   Past Medical History:  Diagnosis Date   Arthritis    Asthma    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HCC)    Dyspnea    Epigastric hernia    GERD (gastroesophageal reflux disease)    History of 2019 novel coronavirus disease (COVID-19) 06/24/2019   Pneumonia    Polysubstance abuse (HCC)    ETOH, marijuana, cocaine, amphetamines   Tachycardia     Past Surgical History:  Procedure Laterality Date   EPIGASTRIC HERNIA REPAIR N/A 11/15/2020   Procedure: HERNIA REPAIR EPIGASTRIC ADULT, open;  Surgeon: Duanne Guess, MD;  Location: [facility] ORS;  Service: General;  Laterality: N/A;   None      Current Medications: No outpatient medications have been marked as taking for the 12/06/20 encounter (Appointment) with Christell Constant, MD.     Allergies:   Patient has no known allergies.   Social History   Socioeconomic History   Marital status: Single     Spouse name: Not on file   Number of children: Not on file   Years of education: Not on file   Highest education level: Not on file  Occupational History   Not on file  Tobacco Use   Smoking status: Every Day    Packs/day: 1.00    Years: 20.00    Pack years: 20.00    Types: Cigarettes   Smokeless tobacco: Never  Vaping Use   Vaping Use: Never used  Substance and Sexual Activity   Alcohol use: Yes    Comment: 1/5th of gin every 1-2 weeks   Drug use: Yes    Types: Marijuana, Cocaine, Amphetamines    Comment: last cocaine use 09/2020   Sexual activity: Not on file  Other Topics Concern   Not on file  Social History Narrative   Not on file   Social Determinants of Health   Financial Resource Strain: Not on file  Food Insecurity: Not on file  Transportation Needs: Not on file  Physical Activity: Not on file  Stress: Not on file  Social Connections: Not on file     Family History: The patient's family history includes Heart disease in his father and mother.  ROS:   Please see the history of present illness.     All other systems reviewed and are negative.  EKGs/Labs/Other Studies Reviewed:    The following studies were reviewed today:   EKG:   08/28/20:  Sinus arrhythmia 71, borderline IVCD  Transthoracic Echocardiogram: Date: 08/18/20 Results: IMPRESSIONS   1. Left ventricular ejection fraction, by estimation, is 55 to 60%. The  left ventricle has normal function. The left ventricle has no regional  wall motion abnormalities. Left ventricular diastolic parameters were  normal.   2. Right ventricular systolic function is normal. The right ventricular  size is normal. Tricuspid regurgitation signal is inadequate for assessing  PA pressure.   3. The mitral valve is normal in structure. No evidence of mitral valve  regurgitation. No evidence of mitral stenosis.   4. The aortic valve is tricuspid. Aortic valve regurgitation is not  visualized. No aortic stenosis  is present.   5. The inferior vena cava is normal in size with greater than 50%  respiratory variability, suggesting right atrial pressure of 3 mmHg.   Cardiac Event Monitoring***: Date: Results:  Transthoracic Echocardiogram***: Date: Results:  Transesophageal Echocardiogram***: Date: Results:  Cardiac/NonCardiac CT ()***: Date: Results:  ECG or NM Stress Testing ***: Date: Results:  Left/Right Heart Catheterizations***: Date: Results:   Recent Labs: 08/17/2020: TSH 1.801 08/26/2020: ALT 18 10/18/2020: BUN 9; Creatinine, Ser 1.40; Hemoglobin 16.2; Platelets 448; Potassium 3.6; Sodium 131  Recent Lipid Panel    Component Value Date/Time   CHOL 132 08/18/2020 0455   TRIG 77 08/18/2020 0455   HDL 64 08/18/2020 0455   CHOLHDL 2.1 08/18/2020 0455   VLDL 15 08/18/2020 0455   LDLCALC 53 08/18/2020 0455     Risk Assessment/Calculations:     N/A  Physical Exam:    VS:  There were no vitals taken for this visit.    Wt Readings from Last 3 Encounters:  11/23/20 64.5 kg  11/15/20 62.1 kg  11/09/20 65.3 kg     GEN: Well developed in no acute distress, smells of smoke HEENT: Poor Dentition NECK: No JVD; No carotid bruits LYMPHATICS: No lymphadenopathy CARDIAC: RRR, no murmurs, rubs, gallops RESPIRATORY:  Clear to auscultation without rales, wheezing or rhonchi  ABDOMEN: Soft, non-tender, non-distended MUSCULOSKELETAL:  No edema; No deformity  SKIN: Warm and dry NEUROLOGIC:  Alert and oriented x 3 PSYCHIATRIC:  Normal affect   ASSESSMENT:    No diagnosis found.  PLAN:    In order of problems listed above:  Polysubstance Abuse- Cocaine, Alcohol, Tobacco, amphetamines GERD Hx of Cocaine Associated vasospasm - will stop PPI and trial H2 blocker - discussed at length how tobacco, cocaine, alcohol, and amphetamines lead to heart disease - offered referral to psych or addiction medicine; deferred at this time  PRN follow up unless new symptoms or  abnormal test results warranting change in plan:  When substance free and testing negative, would consider future ischemic testing (CCTA)  Would be reasonable for  APP Follow up     Medication Adjustments/Labs and Tests Ordered: Current medicines are reviewed at length with the patient today.  Concerns regarding medicines are outlined above.  No orders of the defined types were placed in this encounter.  No orders of the defined types were placed in this encounter.   There are no Patient Instructions on file for this visit.   Signed, Christell Constant, MD  12/04/2020 7:57 PM    [facility]

## 2020-12-06 ENCOUNTER — Ambulatory Visit: Payer: 59 | Admitting: Internal Medicine

## 2021-01-26 ENCOUNTER — Emergency Department (HOSPITAL_COMMUNITY): Payer: 59

## 2021-01-26 ENCOUNTER — Inpatient Hospital Stay (HOSPITAL_COMMUNITY)
Admission: EM | Admit: 2021-01-26 | Discharge: 2021-02-12 | DRG: 956 | Disposition: A | Discharge status: Rehabilitation Facility | Payer: 59 | Attending: General Surgery | Admitting: General Surgery

## 2021-01-26 DIAGNOSIS — S52021B Displaced fracture of olecranon process without intraarticular extension of right ulna, initial encounter for open fracture type I or II: Secondary | ICD-10-CM

## 2021-01-26 DIAGNOSIS — S020XXA Fracture of vault of skull, initial encounter for closed fracture: Principal | ICD-10-CM

## 2021-01-26 DIAGNOSIS — S83104A Unspecified dislocation of right knee, initial encounter: Secondary | ICD-10-CM

## 2021-01-26 DIAGNOSIS — J939 Pneumothorax, unspecified: Secondary | ICD-10-CM

## 2021-01-26 DIAGNOSIS — S82102A Unspecified fracture of upper end of left tibia, initial encounter for closed fracture: Secondary | ICD-10-CM

## 2021-01-26 DIAGNOSIS — S42122A Displaced fracture of acromial process, left shoulder, initial encounter for closed fracture: Secondary | ICD-10-CM

## 2021-01-26 DIAGNOSIS — S82201A Unspecified fracture of shaft of right tibia, initial encounter for closed fracture: Secondary | ICD-10-CM

## 2021-01-26 DIAGNOSIS — S064X9A Epidural hemorrhage with loss of consciousness of unspecified duration, initial encounter: Secondary | ICD-10-CM

## 2021-01-26 DIAGNOSIS — T148XXA Other injury of unspecified body region, initial encounter: Secondary | ICD-10-CM

## 2021-01-26 DIAGNOSIS — T1490XA Injury, unspecified, initial encounter: Secondary | ICD-10-CM

## 2021-01-26 DIAGNOSIS — I609 Nontraumatic subarachnoid hemorrhage, unspecified: Secondary | ICD-10-CM

## 2021-01-26 DIAGNOSIS — Z09 Encounter for follow-up examination after completed treatment for conditions other than malignant neoplasm: Secondary | ICD-10-CM

## 2021-01-26 DIAGNOSIS — R52 Pain, unspecified: Secondary | ICD-10-CM

## 2021-01-26 DIAGNOSIS — F17210 Nicotine dependence, cigarettes, uncomplicated: Secondary | ICD-10-CM | POA: Diagnosis present

## 2021-01-26 DIAGNOSIS — S8410XA Injury of peroneal nerve at lower leg level, unspecified leg, initial encounter: Secondary | ICD-10-CM | POA: Diagnosis present

## 2021-01-26 DIAGNOSIS — R7401 Elevation of levels of liver transaminase levels: Secondary | ICD-10-CM | POA: Diagnosis present

## 2021-01-26 DIAGNOSIS — G5731 Lesion of lateral popliteal nerve, right lower limb: Secondary | ICD-10-CM | POA: Diagnosis present

## 2021-01-26 DIAGNOSIS — D62 Acute posthemorrhagic anemia: Secondary | ICD-10-CM | POA: Diagnosis not present

## 2021-01-26 DIAGNOSIS — S82402A Unspecified fracture of shaft of left fibula, initial encounter for closed fracture: Secondary | ICD-10-CM | POA: Diagnosis present

## 2021-01-26 DIAGNOSIS — Y92410 Unspecified street and highway as the place of occurrence of the external cause: Secondary | ICD-10-CM

## 2021-01-26 DIAGNOSIS — F419 Anxiety disorder, unspecified: Secondary | ICD-10-CM | POA: Diagnosis present

## 2021-01-26 DIAGNOSIS — S064XAA Epidural hemorrhage with loss of consciousness status unknown, initial encounter: Secondary | ICD-10-CM | POA: Diagnosis present

## 2021-01-26 DIAGNOSIS — Z20822 Contact with and (suspected) exposure to covid-19: Secondary | ICD-10-CM | POA: Diagnosis present

## 2021-01-26 DIAGNOSIS — S83511A Sprain of anterior cruciate ligament of right knee, initial encounter: Secondary | ICD-10-CM | POA: Diagnosis not present

## 2021-01-26 DIAGNOSIS — S82141A Displaced bicondylar fracture of right tibia, initial encounter for closed fracture: Secondary | ICD-10-CM | POA: Diagnosis present

## 2021-01-26 DIAGNOSIS — R339 Retention of urine, unspecified: Secondary | ICD-10-CM | POA: Diagnosis not present

## 2021-01-26 DIAGNOSIS — E876 Hypokalemia: Secondary | ICD-10-CM | POA: Diagnosis not present

## 2021-01-26 DIAGNOSIS — G4700 Insomnia, unspecified: Secondary | ICD-10-CM | POA: Diagnosis present

## 2021-01-26 DIAGNOSIS — S022XXA Fracture of nasal bones, initial encounter for closed fracture: Secondary | ICD-10-CM | POA: Diagnosis present

## 2021-01-26 DIAGNOSIS — S83521A Sprain of posterior cruciate ligament of right knee, initial encounter: Secondary | ICD-10-CM | POA: Diagnosis present

## 2021-01-26 DIAGNOSIS — Z23 Encounter for immunization: Secondary | ICD-10-CM

## 2021-01-26 DIAGNOSIS — S82142A Displaced bicondylar fracture of left tibia, initial encounter for closed fracture: Secondary | ICD-10-CM | POA: Diagnosis present

## 2021-01-26 DIAGNOSIS — S066X9A Traumatic subarachnoid hemorrhage with loss of consciousness of unspecified duration, initial encounter: Secondary | ICD-10-CM | POA: Diagnosis present

## 2021-01-26 DIAGNOSIS — S52201B Unspecified fracture of shaft of right ulna, initial encounter for open fracture type I or II: Secondary | ICD-10-CM | POA: Diagnosis present

## 2021-01-26 DIAGNOSIS — S0240EA Zygomatic fracture, right side, initial encounter for closed fracture: Secondary | ICD-10-CM | POA: Diagnosis present

## 2021-01-26 LAB — CBC
HCT: 41.9 % (ref 39.0–52.0)
Hemoglobin: 14.4 g/dL (ref 13.0–17.0)
MCH: 31.4 pg (ref 26.0–34.0)
MCHC: 34.4 g/dL (ref 30.0–36.0)
MCV: 91.5 fL (ref 80.0–100.0)
Platelets: 342 10*3/uL (ref 150–400)
RBC: 4.58 MIL/uL (ref 4.22–5.81)
RDW: 13.3 % (ref 11.5–15.5)
WBC: 10.6 10*3/uL — ABNORMAL HIGH (ref 4.0–10.5)
nRBC: 0.2 % (ref 0.0–0.2)

## 2021-01-26 LAB — I-STAT CHEM 8, ED
BUN: 16 mg/dL (ref 6–20)
Calcium, Ion: 1.04 mmol/L — ABNORMAL LOW (ref 1.15–1.40)
Chloride: 102 mmol/L (ref 98–111)
Creatinine, Ser: 1.2 mg/dL (ref 0.61–1.24)
Glucose, Bld: 151 mg/dL — ABNORMAL HIGH (ref 70–99)
HCT: 43 % (ref 39.0–52.0)
Hemoglobin: 14.6 g/dL (ref 13.0–17.0)
Potassium: 3.2 mmol/L — ABNORMAL LOW (ref 3.5–5.1)
Sodium: 137 mmol/L (ref 135–145)
TCO2: 22 mmol/L (ref 22–32)

## 2021-01-26 LAB — SAMPLE TO BLOOD BANK

## 2021-01-26 LAB — PROTIME-INR
INR: 1 (ref 0.8–1.2)
Prothrombin Time: 13.2 seconds (ref 11.4–15.2)

## 2021-01-26 MED ORDER — FENTANYL CITRATE (PF) 100 MCG/2ML IJ SOLN
INTRAMUSCULAR | Status: AC
  Administered 2021-01-26: 50 ug
  Filled 2021-01-26: qty 2

## 2021-01-26 MED ORDER — FENTANYL CITRATE PF 50 MCG/ML IJ SOSY
50.0000 ug | PREFILLED_SYRINGE | Freq: Once | INTRAMUSCULAR | Status: AC
Start: 2021-01-26 — End: 2021-01-26
  Administered 2021-01-26: 50 ug via INTRAVENOUS

## 2021-01-26 MED ORDER — TETANUS-DIPHTH-ACELL PERTUSSIS 5-2.5-18.5 LF-MCG/0.5 IM SUSY
0.5000 mL | PREFILLED_SYRINGE | Freq: Once | INTRAMUSCULAR | Status: AC
  Administered 2021-01-26: 0.5 mL via INTRAMUSCULAR
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
## 2021-01-26 NOTE — ED Notes (Signed)
Ortho tech at bedside applying long leg splint
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
## 2021-01-27 ENCOUNTER — Encounter (HOSPITAL_COMMUNITY): Admission: EM | Disposition: A | Discharge status: Rehabilitation Facility | Payer: Self-pay | Source: Home / Self Care

## 2021-01-27 ENCOUNTER — Emergency Department (HOSPITAL_COMMUNITY): Payer: 59

## 2021-01-27 ENCOUNTER — Inpatient Hospital Stay (HOSPITAL_COMMUNITY): Payer: 59

## 2021-01-27 ENCOUNTER — Inpatient Hospital Stay (HOSPITAL_COMMUNITY): Payer: 59 | Admitting: Anesthesiology

## 2021-01-27 ENCOUNTER — Encounter (HOSPITAL_COMMUNITY): Payer: Self-pay

## 2021-01-27 DIAGNOSIS — S83104A Unspecified dislocation of right knee, initial encounter: Secondary | ICD-10-CM

## 2021-01-27 DIAGNOSIS — S52021B Displaced fracture of olecranon process without intraarticular extension of right ulna, initial encounter for open fracture type I or II: Secondary | ICD-10-CM

## 2021-01-27 DIAGNOSIS — S42122A Displaced fracture of acromial process, left shoulder, initial encounter for closed fracture: Secondary | ICD-10-CM

## 2021-01-27 DIAGNOSIS — S064X9A Epidural hemorrhage with loss of consciousness of unspecified duration, initial encounter: Secondary | ICD-10-CM | POA: Diagnosis present

## 2021-01-27 DIAGNOSIS — S82102A Unspecified fracture of upper end of left tibia, initial encounter for closed fracture: Secondary | ICD-10-CM

## 2021-01-27 DIAGNOSIS — E876 Hypokalemia: Secondary | ICD-10-CM | POA: Diagnosis not present

## 2021-01-27 DIAGNOSIS — S020XXA Fracture of vault of skull, initial encounter for closed fracture: Secondary | ICD-10-CM | POA: Diagnosis present

## 2021-01-27 DIAGNOSIS — T1490XA Injury, unspecified, initial encounter: Secondary | ICD-10-CM | POA: Diagnosis present

## 2021-01-27 DIAGNOSIS — S069X9D Unspecified intracranial injury with loss of consciousness of unspecified duration, subsequent encounter: Secondary | ICD-10-CM | POA: Diagnosis not present

## 2021-01-27 DIAGNOSIS — D62 Acute posthemorrhagic anemia: Secondary | ICD-10-CM | POA: Diagnosis not present

## 2021-01-27 DIAGNOSIS — Z20822 Contact with and (suspected) exposure to covid-19: Secondary | ICD-10-CM | POA: Diagnosis present

## 2021-01-27 DIAGNOSIS — S8410XA Injury of peroneal nerve at lower leg level, unspecified leg, initial encounter: Secondary | ICD-10-CM | POA: Diagnosis present

## 2021-01-27 DIAGNOSIS — E46 Unspecified protein-calorie malnutrition: Secondary | ICD-10-CM | POA: Diagnosis not present

## 2021-01-27 DIAGNOSIS — S82402A Unspecified fracture of shaft of left fibula, initial encounter for closed fracture: Secondary | ICD-10-CM | POA: Diagnosis present

## 2021-01-27 DIAGNOSIS — F419 Anxiety disorder, unspecified: Secondary | ICD-10-CM | POA: Diagnosis present

## 2021-01-27 DIAGNOSIS — S069X9S Unspecified intracranial injury with loss of consciousness of unspecified duration, sequela: Secondary | ICD-10-CM | POA: Diagnosis not present

## 2021-01-27 DIAGNOSIS — F17210 Nicotine dependence, cigarettes, uncomplicated: Secondary | ICD-10-CM | POA: Diagnosis present

## 2021-01-27 DIAGNOSIS — S52201B Unspecified fracture of shaft of right ulna, initial encounter for open fracture type I or II: Secondary | ICD-10-CM | POA: Diagnosis present

## 2021-01-27 DIAGNOSIS — G479 Sleep disorder, unspecified: Secondary | ICD-10-CM | POA: Diagnosis not present

## 2021-01-27 DIAGNOSIS — S82142A Displaced bicondylar fracture of left tibia, initial encounter for closed fracture: Secondary | ICD-10-CM | POA: Diagnosis present

## 2021-01-27 DIAGNOSIS — S066X9A Traumatic subarachnoid hemorrhage with loss of consciousness of unspecified duration, initial encounter: Secondary | ICD-10-CM | POA: Diagnosis present

## 2021-01-27 DIAGNOSIS — G8918 Other acute postprocedural pain: Secondary | ICD-10-CM | POA: Diagnosis not present

## 2021-01-27 DIAGNOSIS — S022XXA Fracture of nasal bones, initial encounter for closed fracture: Secondary | ICD-10-CM | POA: Diagnosis present

## 2021-01-27 DIAGNOSIS — G5731 Lesion of lateral popliteal nerve, right lower limb: Secondary | ICD-10-CM | POA: Diagnosis present

## 2021-01-27 DIAGNOSIS — Z23 Encounter for immunization: Secondary | ICD-10-CM | POA: Diagnosis not present

## 2021-01-27 DIAGNOSIS — G4700 Insomnia, unspecified: Secondary | ICD-10-CM | POA: Diagnosis present

## 2021-01-27 DIAGNOSIS — S82141A Displaced bicondylar fracture of right tibia, initial encounter for closed fracture: Secondary | ICD-10-CM | POA: Diagnosis present

## 2021-01-27 DIAGNOSIS — R7401 Elevation of levels of liver transaminase levels: Secondary | ICD-10-CM | POA: Diagnosis present

## 2021-01-27 DIAGNOSIS — Y92410 Unspecified street and highway as the place of occurrence of the external cause: Secondary | ICD-10-CM | POA: Diagnosis not present

## 2021-01-27 DIAGNOSIS — R509 Fever, unspecified: Secondary | ICD-10-CM | POA: Diagnosis not present

## 2021-01-27 DIAGNOSIS — S82192S Other fracture of upper end of left tibia, sequela: Secondary | ICD-10-CM | POA: Diagnosis not present

## 2021-01-27 DIAGNOSIS — E8809 Other disorders of plasma-protein metabolism, not elsewhere classified: Secondary | ICD-10-CM | POA: Diagnosis not present

## 2021-01-27 DIAGNOSIS — S42122S Displaced fracture of acromial process, left shoulder, sequela: Secondary | ICD-10-CM | POA: Diagnosis not present

## 2021-01-27 DIAGNOSIS — S83521A Sprain of posterior cruciate ligament of right knee, initial encounter: Secondary | ICD-10-CM | POA: Diagnosis present

## 2021-01-27 DIAGNOSIS — T07XXXA Unspecified multiple injuries, initial encounter: Secondary | ICD-10-CM | POA: Diagnosis not present

## 2021-01-27 DIAGNOSIS — S064XAA Epidural hemorrhage with loss of consciousness status unknown, initial encounter: Secondary | ICD-10-CM | POA: Diagnosis present

## 2021-01-27 DIAGNOSIS — S066X9D Traumatic subarachnoid hemorrhage with loss of consciousness of unspecified duration, subsequent encounter: Secondary | ICD-10-CM | POA: Diagnosis not present

## 2021-01-27 DIAGNOSIS — S0240EA Zygomatic fracture, right side, initial encounter for closed fracture: Secondary | ICD-10-CM | POA: Diagnosis present

## 2021-01-27 DIAGNOSIS — E871 Hypo-osmolality and hyponatremia: Secondary | ICD-10-CM | POA: Diagnosis not present

## 2021-01-27 DIAGNOSIS — R339 Retention of urine, unspecified: Secondary | ICD-10-CM | POA: Diagnosis not present

## 2021-01-27 DIAGNOSIS — S83511A Sprain of anterior cruciate ligament of right knee, initial encounter: Secondary | ICD-10-CM | POA: Diagnosis present

## 2021-01-27 HISTORY — PX: ORIF ELBOW FRACTURE: SHX5031

## 2021-01-27 HISTORY — PX: ORIF TIBIA FRACTURE: SHX5416

## 2021-01-27 HISTORY — PX: EXTERNAL FIXATION LEG: SHX1549

## 2021-01-27 LAB — ABO/RH: ABO/RH(D): A POS

## 2021-01-27 LAB — SURGICAL PCR SCREEN
MRSA, PCR: NEGATIVE
Staphylococcus aureus: NEGATIVE

## 2021-01-27 LAB — COMPREHENSIVE METABOLIC PANEL
ALT: 118 U/L — ABNORMAL HIGH (ref 0–44)
AST: 154 U/L — ABNORMAL HIGH (ref 15–41)
Albumin: 4.1 g/dL (ref 3.5–5.0)
Alkaline Phosphatase: 74 U/L (ref 38–126)
Anion gap: 12 (ref 5–15)
BUN: 16 mg/dL (ref 6–20)
CO2: 21 mmol/L — ABNORMAL LOW (ref 22–32)
Calcium: 9.1 mg/dL (ref 8.9–10.3)
Chloride: 102 mmol/L (ref 98–111)
Creatinine, Ser: 1.29 mg/dL — ABNORMAL HIGH (ref 0.61–1.24)
GFR, Estimated: >60 mL/min (ref >60)
Glucose, Bld: 159 mg/dL — ABNORMAL HIGH (ref 70–99)
Potassium: 3.4 mmol/L — ABNORMAL LOW (ref 3.5–5.1)
Sodium: 135 mmol/L (ref 135–145)
Total Bilirubin: 0.8 mg/dL (ref 0.3–1.2)
Total Protein: 6.2 g/dL — ABNORMAL LOW (ref 6.5–8.1)

## 2021-01-27 LAB — LACTIC ACID, PLASMA: Lactic Acid, Venous: 4.8 mmol/L (ref 0.5–1.9)

## 2021-01-27 LAB — RESP PANEL BY RT-PCR (FLU A&B, COVID) ARPGX2
Influenza A by PCR: NEGATIVE
Influenza B by PCR: NEGATIVE
SARS Coronavirus 2 by RT PCR: NEGATIVE

## 2021-01-27 LAB — ETHANOL: Alcohol, Ethyl (B): <10 mg/dL (ref <10)

## 2021-01-27 LAB — HIV ANTIBODY (ROUTINE TESTING W REFLEX): HIV Screen 4th Generation wRfx: NONREACTIVE

## 2021-01-27 LAB — URINALYSIS, ROUTINE W REFLEX MICROSCOPIC
Bacteria, UA: NONE SEEN
Bilirubin Urine: NEGATIVE
Glucose, UA: NEGATIVE mg/dL
Ketones, ur: NEGATIVE mg/dL
Leukocytes,Ua: NEGATIVE
Nitrite: NEGATIVE
Protein, ur: NEGATIVE mg/dL
Specific Gravity, Urine: >1.046 — ABNORMAL HIGH (ref 1.005–1.030)
pH: 5 (ref 5.0–8.0)

## 2021-01-27 LAB — MRSA NEXT GEN BY PCR, NASAL: MRSA by PCR Next Gen: NOT DETECTED

## 2021-01-27 LAB — VITAMIN D 25 HYDROXY (VIT D DEFICIENCY, FRACTURES): Vit D, 25-Hydroxy: 28.98 ng/mL — ABNORMAL LOW (ref 30–100)

## 2021-01-27 SURGERY — EXTERNAL FIXATION, LOWER EXTREMITY
Anesthesia: General | Site: Leg Lower | Laterality: Right

## 2021-01-27 MED ORDER — ONDANSETRON 4 MG PO TBDP: 4.0000 mg | ORAL_TABLET | Freq: Four times a day (QID) | ORAL | Status: DC | PRN

## 2021-01-27 MED ORDER — MORPHINE SULFATE (PF) 4 MG/ML IV SOLN
4.0000 mg | INTRAVENOUS | Status: DC | PRN
  Administered 2021-01-27: 4 mg via INTRAVENOUS
  Filled 2021-01-27: qty 1

## 2021-01-27 MED ORDER — SUGAMMADEX SODIUM 200 MG/2ML IV SOLN
INTRAVENOUS | Status: DC | PRN
  Administered 2021-01-27: 200 mg via INTRAVENOUS

## 2021-01-27 MED ORDER — SODIUM CHLORIDE 0.9 % IV SOLN
INTRAVENOUS | Status: DC
Start: 2021-01-27 — End: 2021-02-04

## 2021-01-27 MED ORDER — OXYCODONE HCL 5 MG PO TABS
5.0000 mg | ORAL_TABLET | ORAL | Status: DC | PRN
  Administered 2021-01-28 – 2021-01-29 (×4): 10 mg via ORAL
  Administered 2021-01-30: 5 mg via ORAL
  Administered 2021-01-30 (×2): 10 mg via ORAL
  Filled 2021-01-27 (×8): qty 2

## 2021-01-27 MED ORDER — ONDANSETRON HCL 4 MG PO TABS: 4.0000 mg | ORAL_TABLET | Freq: Four times a day (QID) | ORAL | Status: DC | PRN

## 2021-01-27 MED ORDER — DEXAMETHASONE SODIUM PHOSPHATE 10 MG/ML IJ SOLN
INTRAMUSCULAR | Status: DC | PRN
  Administered 2021-01-27: 10 mg via INTRAVENOUS

## 2021-01-27 MED ORDER — MIDAZOLAM HCL 2 MG/2ML IJ SOLN
INTRAMUSCULAR | Status: AC
  Filled 2021-01-27: qty 2

## 2021-01-27 MED ORDER — FENTANYL CITRATE PF 50 MCG/ML IJ SOSY
100.0000 ug | PREFILLED_SYRINGE | Freq: Once | INTRAMUSCULAR | Status: AC
  Administered 2021-01-27: 100 ug via INTRAVENOUS
  Filled 2021-01-27: qty 2

## 2021-01-27 MED ORDER — CHLORHEXIDINE GLUCONATE 0.12 % MT SOLN
OROMUCOSAL | Status: AC
  Administered 2021-01-27: 15 mL via OROMUCOSAL
  Filled 2021-01-27: qty 15

## 2021-01-27 MED ORDER — VANCOMYCIN HCL 1000 MG IV SOLR
INTRAVENOUS | Status: AC
  Filled 2021-01-27: qty 20

## 2021-01-27 MED ORDER — HYDROMORPHONE HCL 1 MG/ML IJ SOLN
INTRAMUSCULAR | Status: DC | PRN
  Administered 2021-01-27: .5 mg via INTRAVENOUS

## 2021-01-27 MED ORDER — METHOCARBAMOL 500 MG PO TABS
1000.0000 mg | ORAL_TABLET | Freq: Three times a day (TID) | ORAL | Status: DC
  Administered 2021-01-27 – 2021-01-31 (×14): 1000 mg via ORAL
  Filled 2021-01-27 (×15): qty 2

## 2021-01-27 MED ORDER — ONDANSETRON HCL 4 MG/2ML IJ SOLN: 4.0000 mg | Freq: Once | INTRAMUSCULAR | Status: AC

## 2021-01-27 MED ORDER — ROCURONIUM BROMIDE 10 MG/ML (PF) SYRINGE
PREFILLED_SYRINGE | INTRAVENOUS | Status: DC | PRN
  Administered 2021-01-27 (×3): 50 mg via INTRAVENOUS

## 2021-01-27 MED ORDER — OXYCODONE HCL 5 MG PO TABS
5.0000 mg | ORAL_TABLET | ORAL | Status: DC | PRN
  Administered 2021-01-27: 10 mg via ORAL
  Filled 2021-01-27: qty 2

## 2021-01-27 MED ORDER — 0.9 % SODIUM CHLORIDE (POUR BTL) OPTIME
TOPICAL | Status: DC | PRN
  Administered 2021-01-27: 1000 mL

## 2021-01-27 MED ORDER — ORAL CARE MOUTH RINSE: 15.0000 mL | Freq: Once | OROMUCOSAL | Status: AC

## 2021-01-27 MED ORDER — FENTANYL CITRATE (PF) 250 MCG/5ML IJ SOLN
INTRAMUSCULAR | Status: AC
  Filled 2021-01-27: qty 5

## 2021-01-27 MED ORDER — CEFAZOLIN SODIUM-DEXTROSE 2-4 GM/100ML-% IV SOLN
INTRAVENOUS | Status: AC
  Filled 2021-01-27: qty 100

## 2021-01-27 MED ORDER — HYDROMORPHONE HCL 1 MG/ML IJ SOLN
INTRAMUSCULAR | Status: AC
  Filled 2021-01-27: qty 0.5

## 2021-01-27 MED ORDER — IOHEXOL 350 MG/ML SOLN
50.0000 mL | Freq: Once | INTRAVENOUS | Status: AC | PRN
  Administered 2021-01-27: 50 mL via INTRAVENOUS

## 2021-01-27 MED ORDER — CEFAZOLIN SODIUM-DEXTROSE 2-4 GM/100ML-% IV SOLN
2.0000 g | Freq: Three times a day (TID) | INTRAVENOUS | Status: DC
  Filled 2021-01-27 (×2): qty 100

## 2021-01-27 MED ORDER — HYDROMORPHONE HCL 1 MG/ML IJ SOLN: 0.2500 mg | INTRAMUSCULAR | Status: DC | PRN

## 2021-01-27 MED ORDER — ACETAMINOPHEN 500 MG PO TABS
1000.0000 mg | ORAL_TABLET | Freq: Four times a day (QID) | ORAL | Status: DC
  Administered 2021-01-27 – 2021-01-31 (×15): 1000 mg via ORAL
  Filled 2021-01-27 (×16): qty 2

## 2021-01-27 MED ORDER — OXYCODONE HCL 5 MG PO TABS
10.0000 mg | ORAL_TABLET | ORAL | Status: DC | PRN
  Administered 2021-01-27 – 2021-01-29 (×4): 10 mg via ORAL
  Administered 2021-01-30 – 2021-01-31 (×2): 15 mg via ORAL
  Administered 2021-01-31: 10 mg via ORAL
  Administered 2021-01-31 – 2021-02-01 (×3): 15 mg via ORAL
  Filled 2021-01-27: qty 3
  Filled 2021-01-27: qty 2
  Filled 2021-01-27: qty 3
  Filled 2021-01-27: qty 2
  Filled 2021-01-27: qty 3
  Filled 2021-01-27 (×2): qty 2
  Filled 2021-01-27 (×2): qty 3

## 2021-01-27 MED ORDER — MUPIROCIN 2 % EX OINT
1.0000 "application " | TOPICAL_OINTMENT | Freq: Two times a day (BID) | CUTANEOUS | Status: DC
  Administered 2021-01-27: 1 via NASAL
  Filled 2021-01-27 (×2): qty 22

## 2021-01-27 MED ORDER — LACTATED RINGERS IV SOLN: INTRAVENOUS | Status: DC

## 2021-01-27 MED ORDER — CEFAZOLIN SODIUM-DEXTROSE 2-4 GM/100ML-% IV SOLN
2.0000 g | Freq: Once | INTRAVENOUS | Status: AC
  Administered 2021-01-27: 2 g via INTRAVENOUS

## 2021-01-27 MED ORDER — PHENYLEPHRINE HCL-NACL 20-0.9 MG/250ML-% IV SOLN
INTRAVENOUS | Status: DC | PRN
  Administered 2021-01-27: 25 ug/min via INTRAVENOUS

## 2021-01-27 MED ORDER — CHLORHEXIDINE GLUCONATE 0.12 % MT SOLN: 15.0000 mL | Freq: Once | OROMUCOSAL | Status: AC

## 2021-01-27 MED ORDER — ACETAMINOPHEN 10 MG/ML IV SOLN
INTRAVENOUS | Status: AC
  Filled 2021-01-27: qty 100

## 2021-01-27 MED ORDER — SODIUM CHLORIDE 0.9 % IR SOLN
Status: DC | PRN
  Administered 2021-01-27: 3000 mL

## 2021-01-27 MED ORDER — ACETAMINOPHEN 10 MG/ML IV SOLN
INTRAVENOUS | Status: DC | PRN
  Administered 2021-01-27: 1000 mg via INTRAVENOUS

## 2021-01-27 MED ORDER — AMISULPRIDE (ANTIEMETIC) 5 MG/2ML IV SOLN: 10.0000 mg | Freq: Once | INTRAVENOUS | Status: DC | PRN

## 2021-01-27 MED ORDER — CEFAZOLIN SODIUM-DEXTROSE 2-4 GM/100ML-% IV SOLN
2.0000 g | Freq: Three times a day (TID) | INTRAVENOUS | Status: AC
  Administered 2021-01-27 – 2021-01-28 (×3): 2 g via INTRAVENOUS
  Filled 2021-01-27 (×3): qty 100

## 2021-01-27 MED ORDER — LEVETIRACETAM IN NACL 500 MG/100ML IV SOLN
500.0000 mg | Freq: Two times a day (BID) | INTRAVENOUS | Status: AC
  Administered 2021-01-27 – 2021-02-02 (×14): 500 mg via INTRAVENOUS
  Filled 2021-01-27 (×16): qty 100

## 2021-01-27 MED ORDER — ONDANSETRON HCL 4 MG/2ML IJ SOLN: 4.0000 mg | Freq: Four times a day (QID) | INTRAMUSCULAR | Status: DC | PRN

## 2021-01-27 MED ORDER — FENTANYL CITRATE (PF) 250 MCG/5ML IJ SOLN
INTRAMUSCULAR | Status: DC | PRN
  Administered 2021-01-27 (×3): 50 ug via INTRAVENOUS
  Administered 2021-01-27: 100 ug via INTRAVENOUS

## 2021-01-27 MED ORDER — HYDROMORPHONE HCL 1 MG/ML IJ SOLN
0.5000 mg | INTRAMUSCULAR | Status: DC | PRN
  Administered 2021-01-28: 1 mg via INTRAVENOUS
  Filled 2021-01-27: qty 1

## 2021-01-27 MED ORDER — ONDANSETRON HCL 4 MG/2ML IJ SOLN
INTRAMUSCULAR | Status: DC | PRN
  Administered 2021-01-27: 4 mg via INTRAVENOUS

## 2021-01-27 MED ORDER — DOCUSATE SODIUM 100 MG PO CAPS: 100.0000 mg | ORAL_CAPSULE | Freq: Two times a day (BID) | ORAL | Status: DC

## 2021-01-27 MED ORDER — DOCUSATE SODIUM 100 MG PO CAPS
100.0000 mg | ORAL_CAPSULE | Freq: Two times a day (BID) | ORAL | Status: DC
  Administered 2021-01-27 – 2021-02-01 (×9): 100 mg via ORAL
  Filled 2021-01-27 (×9): qty 1

## 2021-01-27 MED ORDER — MORPHINE SULFATE (PF) 4 MG/ML IV SOLN
INTRAVENOUS | Status: AC
  Administered 2021-01-27: 4 mg via INTRAVENOUS
  Filled 2021-01-27: qty 1

## 2021-01-27 MED ORDER — IOHEXOL 300 MG/ML  SOLN
100.0000 mL | Freq: Once | INTRAMUSCULAR | Status: AC | PRN
  Administered 2021-01-27: 100 mL via INTRAVENOUS

## 2021-01-27 MED ORDER — PHENYLEPHRINE HCL (PRESSORS) 10 MG/ML IV SOLN
INTRAVENOUS | Status: DC | PRN
  Administered 2021-01-27: 80 ug via INTRAVENOUS

## 2021-01-27 MED ORDER — CEFAZOLIN SODIUM-DEXTROSE 2-3 GM-%(50ML) IV SOLR
INTRAVENOUS | Status: DC | PRN
  Administered 2021-01-27: 2 g via INTRAVENOUS

## 2021-01-27 MED ORDER — METOCLOPRAMIDE HCL 5 MG PO TABS
5.0000 mg | ORAL_TABLET | Freq: Three times a day (TID) | ORAL | Status: DC | PRN
  Filled 2021-01-27: qty 2

## 2021-01-27 MED ORDER — PROPOFOL 10 MG/ML IV BOLUS
INTRAVENOUS | Status: AC
  Filled 2021-01-27: qty 20

## 2021-01-27 MED ORDER — FENTANYL CITRATE PF 50 MCG/ML IJ SOSY
50.0000 ug | PREFILLED_SYRINGE | Freq: Once | INTRAMUSCULAR | Status: AC
Start: 2021-01-27 — End: 2021-01-26
  Administered 2021-01-26: 50 ug via INTRAVENOUS

## 2021-01-27 MED ORDER — CHLORHEXIDINE GLUCONATE CLOTH 2 % EX PADS
6.0000 | MEDICATED_PAD | Freq: Every day | CUTANEOUS | Status: DC
  Administered 2021-01-27 – 2021-02-05 (×10): 6 via TOPICAL

## 2021-01-27 MED ORDER — HYDROMORPHONE HCL 1 MG/ML IJ SOLN
1.0000 mg | INTRAMUSCULAR | Status: DC | PRN
  Filled 2021-01-27: qty 1

## 2021-01-27 MED ORDER — LIDOCAINE 2% (20 MG/ML) 5 ML SYRINGE
INTRAMUSCULAR | Status: DC | PRN
  Administered 2021-01-27: 60 mg via INTRAVENOUS

## 2021-01-27 MED ORDER — VANCOMYCIN HCL 1000 MG IV SOLR
INTRAVENOUS | Status: DC | PRN
  Administered 2021-01-27: 1000 mg
  Administered 2021-01-27: 1000 mg via TOPICAL

## 2021-01-27 MED ORDER — METOCLOPRAMIDE HCL 5 MG/ML IJ SOLN: 5.0000 mg | Freq: Three times a day (TID) | INTRAMUSCULAR | Status: DC | PRN

## 2021-01-27 MED ORDER — SODIUM CHLORIDE 0.9 % IV BOLUS
1000.0000 mL | Freq: Once | INTRAVENOUS | Status: AC
  Administered 2021-01-26: 1000 mL via INTRAVENOUS

## 2021-01-27 MED ORDER — ALBUMIN HUMAN 5 % IV SOLN
INTRAVENOUS | Status: DC | PRN
Start: 2021-01-27 — End: 2021-01-27

## 2021-01-27 MED ORDER — ONDANSETRON HCL 4 MG/2ML IJ SOLN
INTRAMUSCULAR | Status: AC
  Filled 2021-01-27: qty 2

## 2021-01-27 MED ORDER — PROPOFOL 10 MG/ML IV BOLUS
INTRAVENOUS | Status: DC | PRN
  Administered 2021-01-27: 150 mg via INTRAVENOUS

## 2021-01-27 MED ORDER — POLYETHYLENE GLYCOL 3350 17 G PO PACK
17.0000 g | PACK | Freq: Every day | ORAL | Status: DC | PRN
  Administered 2021-01-29: 17 g via ORAL
  Filled 2021-01-27: qty 1

## 2021-01-27 SURGICAL SUPPLY — 114 items
APL PRP STRL LF DISP 70% ISPRP (MISCELLANEOUS) ×3
APL SKNCLS STERI-STRIP NONHPOA (GAUZE/BANDAGES/DRESSINGS)
BAG COUNTER SPONGE SURGICOUNT (BAG) ×4 IMPLANT
BAG SPNG CNTER NS LX DISP (BAG) ×3
BANDAGE ESMARK 6X9 LF (GAUZE/BANDAGES/DRESSINGS) IMPLANT
BAR EXFX 400X11 NS LF (EXFIX) ×6
BAR GLASS FIBER EXFX 11X400 (EXFIX) ×8 IMPLANT
BENZOIN TINCTURE PRP APPL 2/3 (GAUZE/BANDAGES/DRESSINGS) IMPLANT
BIT DRILL 3.3 LONG (BIT) ×8 IMPLANT
BIT DRILL 4.8X200 CANN (BIT) ×4 IMPLANT
BIT DRILL CANN MED FLUTE 4.0 (BIT) ×3 IMPLANT
BIT DRILL ORANGE 4.0 LONG (BIT) ×4 IMPLANT
BIT DRILL QC 3.3X195 (BIT) ×4 IMPLANT
BLADE AVERAGE 25X9 (BLADE) IMPLANT
BLADE CLIPPER SURG (BLADE) ×4 IMPLANT
BLADE SURG 10 STRL SS (BLADE) IMPLANT
BNDG CMPR 9X4 STRL LF SNTH (GAUZE/BANDAGES/DRESSINGS)
BNDG CMPR 9X6 STRL LF SNTH (GAUZE/BANDAGES/DRESSINGS)
BNDG COHESIVE 4X5 TAN STRL (GAUZE/BANDAGES/DRESSINGS) ×4 IMPLANT
BNDG ELASTIC 3X5.8 VLCR STR LF (GAUZE/BANDAGES/DRESSINGS) ×4 IMPLANT
BNDG ELASTIC 4X5.8 VLCR STR LF (GAUZE/BANDAGES/DRESSINGS) ×12 IMPLANT
BNDG ELASTIC 6X5.8 VLCR STR LF (GAUZE/BANDAGES/DRESSINGS) ×4 IMPLANT
BNDG ESMARK 4X9 LF (GAUZE/BANDAGES/DRESSINGS) IMPLANT
BNDG ESMARK 6X9 LF (GAUZE/BANDAGES/DRESSINGS)
BNDG GAUZE ELAST 4 BULKY (GAUZE/BANDAGES/DRESSINGS) ×8 IMPLANT
BRUSH SCRUB EZ PLAIN DRY (MISCELLANEOUS) ×8 IMPLANT
CAP LOCK NCB (Cap) ×28 IMPLANT
CHLORAPREP W/TINT 26 (MISCELLANEOUS) ×4 IMPLANT
CLEANER TIP ELECTROSURG 2X2 (MISCELLANEOUS) ×4 IMPLANT
COVER SURGICAL LIGHT HANDLE (MISCELLANEOUS) ×8 IMPLANT
CUFF TOURN SGL QUICK 18X4 (TOURNIQUET CUFF) IMPLANT
CUFF TOURN SGL QUICK 24 (TOURNIQUET CUFF)
CUFF TOURN SGL QUICK 34 (TOURNIQUET CUFF) ×4
CUFF TRNQT CYL 24X4X16.5-23 (TOURNIQUET CUFF) IMPLANT
CUFF TRNQT CYL 34X4.125X (TOURNIQUET CUFF) ×3 IMPLANT
DECANTER SPIKE VIAL GLASS SM (MISCELLANEOUS) IMPLANT
DRAPE C-ARM 42X72 X-RAY (DRAPES) ×4 IMPLANT
DRAPE C-ARMOR (DRAPES) ×4 IMPLANT
DRAPE IMP U-DRAPE 54X76 (DRAPES) ×8 IMPLANT
DRAPE INCISE IOBAN 66X45 STRL (DRAPES) IMPLANT
DRAPE ORTHO SPLIT 77X108 STRL (DRAPES) ×8
DRAPE SURG ORHT 6 SPLT 77X108 (DRAPES) ×6 IMPLANT
DRAPE U-SHAPE 47X51 STRL (DRAPES) ×4 IMPLANT
DRILL CANN 4.0MM (BIT) ×4
DRSG ADAPTIC 3X8 NADH LF (GAUZE/BANDAGES/DRESSINGS) ×4 IMPLANT
DRSG MEPITEL 4X7.2 (GAUZE/BANDAGES/DRESSINGS) ×8 IMPLANT
DRSG PAD ABDOMINAL 8X10 ST (GAUZE/BANDAGES/DRESSINGS) ×16 IMPLANT
ELECT REM PT RETURN 9FT ADLT (ELECTROSURGICAL) ×4
ELECTRODE REM PT RTRN 9FT ADLT (ELECTROSURGICAL) ×3 IMPLANT
GAUZE SPONGE 4X4 12PLY STRL (GAUZE/BANDAGES/DRESSINGS) ×8 IMPLANT
GAUZE XEROFORM 5X9 LF (GAUZE/BANDAGES/DRESSINGS) ×4 IMPLANT
GLOVE SURG ENC MOIS LTX SZ6.5 (GLOVE) ×12 IMPLANT
GLOVE SURG ENC MOIS LTX SZ7.5 (GLOVE) ×16 IMPLANT
GLOVE SURG UNDER POLY LF SZ6.5 (GLOVE) ×4 IMPLANT
GLOVE SURG UNDER POLY LF SZ7.5 (GLOVE) ×4 IMPLANT
GLOVE XGUARD RR 2 7.5 (GLOVE) ×3 IMPLANT
GLOVE XGUARD RR2 7.5 (GLOVE) ×4
GOWN STRL REUS W/ TWL LRG LVL3 (GOWN DISPOSABLE) ×6 IMPLANT
GOWN STRL REUS W/TWL LRG LVL3 (GOWN DISPOSABLE) ×8
K-WIRE 2.0 (WIRE) ×4
K-WIRE FXSTD 280X2XNS SS (WIRE) ×3
KIT BASIN OR (CUSTOM PROCEDURE TRAY) ×4 IMPLANT
KIT TURNOVER KIT B (KITS) ×4 IMPLANT
KWIRE FXSTD 280X2XNS SS (WIRE) ×3 IMPLANT
MANIFOLD NEPTUNE II (INSTRUMENTS) ×4 IMPLANT
NEEDLE 22X1 1/2 (OR ONLY) (NEEDLE) IMPLANT
NS IRRIG 1000ML POUR BTL (IV SOLUTION) ×4 IMPLANT
PACK ORTHO EXTREMITY (CUSTOM PROCEDURE TRAY) ×4 IMPLANT
PACK TOTAL JOINT (CUSTOM PROCEDURE TRAY) ×4 IMPLANT
PAD ARMBOARD 7.5X6 YLW CONV (MISCELLANEOUS) ×8 IMPLANT
PAD CAST 3X4 CTTN HI CHSV (CAST SUPPLIES) ×6 IMPLANT
PAD CAST 4YDX4 CTTN HI CHSV (CAST SUPPLIES) ×3 IMPLANT
PADDING CAST COTTON 3X4 STRL (CAST SUPPLIES) ×8
PADDING CAST COTTON 4X4 STRL (CAST SUPPLIES) ×4
PADDING CAST COTTON 6X4 STRL (CAST SUPPLIES) ×4 IMPLANT
PIN CLAMP 2BAR 75MM BLUE (EXFIX) ×8 IMPLANT
PIN GUIDE DRILL TIP 2.8X300 (DRILL) ×4 IMPLANT
PIN HALF 5X160X35 BLUNT TIP (EXFIX) ×8 IMPLANT
PIN HALF 5X200X65 BLUNT TIP (EXFIX) ×8 IMPLANT
PLATE LAT LOCK PA 192 9H LT (Plate) ×4 IMPLANT
SCREW CANNULATED 6.5X105MM (Screw) ×4 IMPLANT
SCREW NCB 4.0 32MM (Screw) ×12 IMPLANT
SCREW NCB 4.0MX50M (Screw) ×4 IMPLANT
SCREW NCB 4.0X36MM (Screw) ×4 IMPLANT
SCREW NCB 4.0X75 CORT S/T (Screw) ×16 IMPLANT
SLING ARM FOAM STRAP LRG (SOFTGOODS) ×4 IMPLANT
SPONGE T-LAP 18X18 ~~LOC~~+RFID (SPONGE) ×8 IMPLANT
STAPLER VISISTAT 35W (STAPLE) ×4 IMPLANT
STRIP CLOSURE SKIN 1/2X4 (GAUZE/BANDAGES/DRESSINGS) IMPLANT
SUCTION FRAZIER HANDLE 10FR (MISCELLANEOUS) ×3
SUCTION TUBE FRAZIER 10FR DISP (MISCELLANEOUS) ×3 IMPLANT
SUT ETHILON 3 0 PS 1 (SUTURE) ×4 IMPLANT
SUT MNCRL AB 3-0 PS2 18 (SUTURE) ×4 IMPLANT
SUT MON AB 2-0 CT1 27 (SUTURE) ×4 IMPLANT
SUT MON AB 2-0 CT1 36 (SUTURE) ×4 IMPLANT
SUT PDS AB 2-0 CT1 27 (SUTURE) IMPLANT
SUT PROLENE 0 CT (SUTURE) IMPLANT
SUT PROLENE 3 0 PS 2 (SUTURE) ×8 IMPLANT
SUT VIC AB 0 CT1 27 (SUTURE) ×8
SUT VIC AB 0 CT1 27XBRD ANBCTR (SUTURE) ×6 IMPLANT
SUT VIC AB 1 CT1 27 (SUTURE) ×4
SUT VIC AB 1 CT1 27XBRD ANBCTR (SUTURE) ×3 IMPLANT
SUT VIC AB 2-0 CT1 27 (SUTURE) ×8
SUT VIC AB 2-0 CT1 TAPERPNT 27 (SUTURE) ×6 IMPLANT
SUT VIC AB 2-0 CT3 27 (SUTURE) IMPLANT
SYR CONTROL 10ML LL (SYRINGE) ×4 IMPLANT
TOWEL GREEN STERILE (TOWEL DISPOSABLE) ×8 IMPLANT
TOWEL GREEN STERILE FF (TOWEL DISPOSABLE) ×8 IMPLANT
TRAY FOLEY MTR SLVR 16FR STAT (SET/KITS/TRAYS/PACK) IMPLANT
TUBE CONNECTING 12X1/4 (SUCTIONS) ×4 IMPLANT
UNDERPAD 30X36 HEAVY ABSORB (UNDERPADS AND DIAPERS) ×4 IMPLANT
WASHER FLAT 6.5MM (Washer) ×4 IMPLANT
WATER STERILE IRR 1000ML POUR (IV SOLUTION) ×8 IMPLANT
YANKAUER SUCT BULB TIP NO VENT (SUCTIONS) ×4 IMPLANT
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
## 2021-01-27 NOTE — ED Notes (Signed)
Pt voicing increased nausea, stating, "I have to throw up." 4mg  zofran overridden & given.

## 2021-01-27 NOTE — Anesthesia Postprocedure Evaluation (Signed)
Anesthesia Post Note  Patient: Kenneth Bautista  Procedure(s) Performed: EXTERNAL FIXATION KNEE (Right: Knee) OPEN REDUCTION INTERNAL FIXATION (ORIF) TIBIA FRACTURE (Left: Leg Lower) OPEN REDUCTION INTERNAL FIXATION (ORIF) ELBOW/OLECRANON FRACTURE (Right: Elbow)     Patient location during evaluation: PACU Anesthesia Type: General Level of consciousness: awake and alert Pain management: pain level controlled Vital Signs Assessment: post-procedure vital signs reviewed and stable Respiratory status: spontaneous breathing, nonlabored ventilation, respiratory function stable and patient connected to nasal cannula oxygen Cardiovascular status: blood pressure returned to baseline and stable Postop Assessment: no apparent nausea or vomiting Anesthetic complications: no   No notable events documented.  Last Vitals:  Vitals:   01/27/21 1540 01/27/21 1600  BP: (!) 103/56 115/61  Pulse: 76 77  Resp: (!) 123 20  Temp: 36.7 C 37.1 C  SpO2: 97% 96%    Last Pain:  Vitals:   01/27/21 1600  TempSrc: Axillary  PainSc: Asleep                 [name]

## 2021-01-27 NOTE — Progress Notes (Signed)
Orthopedic Tech Progress Note Patient Details:  Kenneth Bautista 12-01-1985 [id number]  Ortho Devices Type of Ortho Device: Sugartong splint Ortho Device/Splint Location: RUE Ortho Device/Splint Interventions: Ordered, Application, Adjustment   Post Interventions Patient Tolerated: Well Instructions Provided: Care of device, Poper ambulation with device  Maysa Lynn 01/27/2021, 1:43 AM
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
## 2021-01-27 NOTE — Anesthesia Preprocedure Evaluation (Signed)
Anesthesia Evaluation  Patient identified by MRN, date of birth, ID band Patient awake    Reviewed: Allergy & Precautions, NPO status , Patient's Chart, lab work & pertinent test results  Airway Mallampati: III  TM Distance: >3 FB Neck ROM: Full    Dental  (+) Dental Advisory Given   Pulmonary Current Smoker,  Small apical ptx   breath sounds clear to auscultation       Cardiovascular negative cardio ROS   Rhythm:Regular Rate:Normal     Neuro/Psych EDH and skull fx    GI/Hepatic negative GI ROS, (+)     substance abuse  alcohol use,   Endo/Other  negative endocrine ROS  Renal/GU Renal InsufficiencyRenal disease     Musculoskeletal Right leg fx, left tibia fx, right elbow fx   Abdominal   Peds  Hematology negative hematology ROS (+)   Anesthesia Other Findings   Reproductive/Obstetrics                             Lab Results  Component Value Date   WBC 10.6 (H) 01/26/2021   HGB 14.6 01/26/2021   HCT 43.0 01/26/2021   MCV 91.5 01/26/2021   PLT 342 01/26/2021   Lab Results  Component Value Date   CREATININE 1.20 01/26/2021   BUN 16 01/26/2021   NA 137 01/26/2021   K 3.2 (L) 01/26/2021   CL 102 01/26/2021   CO2 21 (L) 01/26/2021    Anesthesia Physical Anesthesia Plan  ASA: 3  Anesthesia Plan: General   Post-op Pain Management:    Induction: Intravenous  PONV Risk Score and Plan: 2 and Dexamethasone, Ondansetron and Treatment may vary due to age or medical condition  Airway Management Planned: Oral ETT  Additional Equipment: None  Intra-op Plan:   Post-operative Plan: Extubation in OR and Possible Post-op intubation/ventilation  Informed Consent: I have reviewed the patients History and Physical, chart, labs and discussed the procedure including the risks, benefits and alternatives for the proposed anesthesia with the patient or authorized representative who has  indicated his/her understanding and acceptance.     Dental advisory given  Plan Discussed with: CRNA  Anesthesia Plan Comments:         Anesthesia Quick Evaluation

## 2021-01-27 NOTE — ED Notes (Signed)
Report attempted x1; 4N RN will call back

## 2021-01-27 NOTE — ED Notes (Addendum)
Pt's O2 dropped slightly 89% on RA after given Morphine, placed on 2L [place].
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
## 2021-01-27 NOTE — ED Notes (Signed)
EDP at bedside  
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
## 2021-01-27 NOTE — Progress Notes (Signed)
SLP Cancellation Note  Patient Details Name: Kenneth Bautista MRN: [id number] DOB: 01-22-1986   Cancelled treatment:        Having surgery. Will continue efforts.    Royce Macadamia 01/27/2021, 1:04 PM

## 2021-01-27 NOTE — Anesthesia Procedure Notes (Signed)
Procedure Name: Intubation Date/Time: 01/27/2021 11:54 AM Performed by: Clearnce Sorrel, CRNA Pre-anesthesia Checklist: Patient identified, Emergency Drugs available, Suction available and Patient being monitored Patient Re-evaluated:Patient Re-evaluated prior to induction Oxygen Delivery Method: Circle System Utilized Preoxygenation: Pre-oxygenation with 100% oxygen Induction Type: IV induction Ventilation: Mask ventilation without difficulty Laryngoscope Size: Mac and 3 Grade View: Grade I Tube type: Oral Tube size: 7.5 mm Number of attempts: 1 Airway Equipment and Method: Stylet and Oral airway Placement Confirmation: ETT inserted through vocal cords under direct vision, positive ETCO2 and breath sounds checked- equal and bilateral Secured at: 23 cm Tube secured with: Tape Dental Injury: Teeth and Oropharynx as per pre-operative assessment

## 2021-01-27 NOTE — ED Notes (Signed)
Called CT, pt to CT 2 w/ RN then to floor

## 2021-01-27 NOTE — Transfer of Care (Signed)
Immediate Anesthesia Transfer of Care Note  Patient: RAVIS HERNE  Procedure(s) Performed: EXTERNAL FIXATION KNEE (Right: Knee) OPEN REDUCTION INTERNAL FIXATION (ORIF) TIBIA FRACTURE (Left: Leg Lower) OPEN REDUCTION INTERNAL FIXATION (ORIF) ELBOW/OLECRANON FRACTURE (Right: Elbow)  Patient Location: PACU  Anesthesia Type:General  Level of Consciousness: drowsy  Airway & Oxygen Therapy: Patient Spontanous Breathing and Patient connected to nasal cannula oxygen  Post-op Assessment: Report given to RN and Post -op Vital signs reviewed and stable  Post vital signs: Reviewed and stable  Last Vitals:  Vitals Value Taken Time  BP 131/48 01/27/21 1510  Temp 37.2 C 01/27/21 1510  Pulse 72 01/27/21 1518  Resp 20 01/27/21 1518  SpO2 94 % 01/27/21 1518  Vitals shown include unvalidated device data.  Last Pain:  Vitals:   01/27/21 1510  TempSrc:   PainSc: 0-No pain         Complications: No notable events documented.

## 2021-01-27 NOTE — ED Notes (Signed)
Pt had a single vomiting episode. RN present for episode.
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
## 2021-01-28 ENCOUNTER — Other Ambulatory Visit: Payer: Self-pay

## 2021-01-28 ENCOUNTER — Encounter (HOSPITAL_COMMUNITY): Payer: Self-pay

## 2021-01-28 LAB — BASIC METABOLIC PANEL
Anion gap: 9 (ref 5–15)
BUN: 15 mg/dL (ref 6–20)
CO2: 23 mmol/L (ref 22–32)
Calcium: 8.2 mg/dL — ABNORMAL LOW (ref 8.9–10.3)
Chloride: 105 mmol/L (ref 98–111)
Creatinine, Ser: 0.92 mg/dL (ref 0.61–1.24)
GFR, Estimated: >60 mL/min (ref >60)
Glucose, Bld: 131 mg/dL — ABNORMAL HIGH (ref 70–99)
Potassium: 3.7 mmol/L (ref 3.5–5.1)
Sodium: 137 mmol/L (ref 135–145)

## 2021-01-28 LAB — CBC
HCT: 22.1 % — ABNORMAL LOW (ref 39.0–52.0)
Hemoglobin: 7.6 g/dL — ABNORMAL LOW (ref 13.0–17.0)
MCH: 31.8 pg (ref 26.0–34.0)
MCHC: 34.4 g/dL (ref 30.0–36.0)
MCV: 92.5 fL (ref 80.0–100.0)
Platelets: 157 10*3/uL (ref 150–400)
RBC: 2.39 MIL/uL — ABNORMAL LOW (ref 4.22–5.81)
RDW: 13.5 % (ref 11.5–15.5)
WBC: 7.8 10*3/uL (ref 4.0–10.5)
nRBC: 0 % (ref 0.0–0.2)

## 2021-01-28 MED ORDER — VITAMIN D 25 MCG (1000 UNIT) PO TABS
1000.0000 [IU] | ORAL_TABLET | Freq: Every day | ORAL | Status: DC
  Administered 2021-01-28 – 2021-02-12 (×15): 1000 [IU] via ORAL
  Filled 2021-01-28 (×15): qty 1

## 2021-01-28 NOTE — Progress Notes (Signed)
   01/28/21 1846  EXTERNAL FIXATOR  Placement Date/Time: 01/27/21 1310   External Fixator Present on Admission?: No  External Fixator Type: Zimmer  Site: (c)   Laterality: Right  Pin Site Assessment Clean;Dry;Intact  Pin Site Care Cleansed with normal saline
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
## 2021-01-29 ENCOUNTER — Inpatient Hospital Stay (HOSPITAL_COMMUNITY): Payer: 59

## 2021-01-29 ENCOUNTER — Encounter (HOSPITAL_COMMUNITY): Payer: Self-pay | Admitting: Student

## 2021-01-29 LAB — BASIC METABOLIC PANEL
Anion gap: 7 (ref 5–15)
BUN: 13 mg/dL (ref 6–20)
CO2: 24 mmol/L (ref 22–32)
Calcium: 8.3 mg/dL — ABNORMAL LOW (ref 8.9–10.3)
Chloride: 102 mmol/L (ref 98–111)
Creatinine, Ser: 0.76 mg/dL (ref 0.61–1.24)
GFR, Estimated: >60 mL/min (ref >60)
Glucose, Bld: 113 mg/dL — ABNORMAL HIGH (ref 70–99)
Potassium: 3.6 mmol/L (ref 3.5–5.1)
Sodium: 133 mmol/L — ABNORMAL LOW (ref 135–145)

## 2021-01-29 LAB — CBC
HCT: 18.3 % — ABNORMAL LOW (ref 39.0–52.0)
Hemoglobin: 6.5 g/dL — CL (ref 13.0–17.0)
MCH: 32.8 pg (ref 26.0–34.0)
MCHC: 35.5 g/dL (ref 30.0–36.0)
MCV: 92.4 fL (ref 80.0–100.0)
Platelets: 137 10*3/uL — ABNORMAL LOW (ref 150–400)
RBC: 1.98 MIL/uL — ABNORMAL LOW (ref 4.22–5.81)
RDW: 13.1 % (ref 11.5–15.5)
WBC: 7.5 10*3/uL (ref 4.0–10.5)
nRBC: 0 % (ref 0.0–0.2)

## 2021-01-29 LAB — HEMOGLOBIN AND HEMATOCRIT, BLOOD
HCT: 23.7 % — ABNORMAL LOW (ref 39.0–52.0)
Hemoglobin: 8.1 g/dL — ABNORMAL LOW (ref 13.0–17.0)

## 2021-01-29 LAB — PREPARE RBC (CROSSMATCH)

## 2021-01-29 MED ORDER — IOHEXOL 350 MG/ML SOLN
100.0000 mL | Freq: Once | INTRAVENOUS | Status: AC | PRN
  Administered 2021-01-29: 100 mL via INTRAVENOUS

## 2021-01-29 MED ORDER — SODIUM CHLORIDE 0.9% IV SOLUTION: Freq: Once | INTRAVENOUS | Status: AC

## 2021-01-29 NOTE — Progress Notes (Signed)
Critical Value: Hgb 6.5  Date: 01/29/21 0553  MD notified: Donell Beers @ 0620  Awaiting response from provider.

## 2021-01-29 NOTE — Progress Notes (Addendum)
Orthopaedic Trauma Progress Note  SUBJECTIVE: Doing okay today, more awake than yesterday.  Sore all over, pain in right arm and both legs. Denies any numbness/tingling in extremtiies. Mom at bedside.  Was evaluated by Dr. Everardo Pacific morning regarding right knee, plan is for ligament reconstruction on Thursday.  OBJECTIVE:  Vitals:   01/29/21 1127 01/29/21 1200  BP:  123/85  Pulse: 77 78  Resp: 13 14  Temp: 98.2 F (36.8 C)   SpO2: 95% 96%    General: Resting in bed, NAD Respiratory: No increased work of breathing.  RLE: Ex-fix in place. Pin site dressings CDI. Ace wrap to knee. Ankle DF/PF intact. Endorses sensation to light touch distally. +DP pulse.  LLE: Dressing removed, incision with small amount of serosanguineous drainage.  Clean dressing reapplied.  Tender about proximal tibia.  Significant calf tenderness with palpation, likely related to fracture.  Compartments are swollen but compressible. Able to wiggle toes. Ankle motion stiff. Foot warm and well perfused. +DP pulse  RUE: Sling in place. Dressing CDI, left in place per patient's request. Able to wiggle fingers. Does not tolerate any elbow motion currently secondary to pain. Endorses sensation throughout median, ulnar, radial nerve distributions. Hand warm and well perfused.   IMAGING: Stable post op imaging of right elbow and left tibia. MRI R knee shows complete tear ACL and PCL.   LABS:  Results for orders placed or performed during the hospital encounter of 01/26/21 (from the past 24 hour(s))  CBC     Status: Abnormal   Collection Time: 01/29/21  4:50 AM  Result Value Ref Range   WBC 7.5 4.0 - 10.5 K/uL   RBC 1.98 (L) 4.22 - 5.81 MIL/uL   Hemoglobin 6.5 (LL) 13.0 - 17.0 g/dL   HCT 69.6 (L) 29.5 - 28.4 %   MCV 92.4 80.0 - 100.0 fL   MCH 32.8 26.0 - 34.0 pg   MCHC 35.5 30.0 - 36.0 g/dL   RDW 13.2 44.0 - 10.2 %   Platelets 137 (L) 150 - 400 K/uL   nRBC 0.0 0.0 - 0.2 %  Basic metabolic panel     Status: Abnormal    Collection Time: 01/29/21  4:50 AM  Result Value Ref Range   Sodium 133 (L) 135 - 145 mmol/L   Potassium 3.6 3.5 - 5.1 mmol/L   Chloride 102 98 - 111 mmol/L   CO2 24 22 - 32 mmol/L   Glucose, Bld 113 (H) 70 - 99 mg/dL   BUN 13 6 - 20 mg/dL   Creatinine, Ser 7.25 0.61 - 1.24 mg/dL   Calcium 8.3 (L) 8.9 - 10.3 mg/dL   GFR, Estimated >36 >64 mL/min   Anion gap 7 5 - 15  Prepare RBC (crossmatch)     Status: None   Collection Time: 01/29/21  6:44 AM  Result Value Ref Range   Order Confirmation      ORDER PROCESSED BY BLOOD BANK Performed at [facility], [address], [place], [place] [address]   Hemoglobin and hematocrit, blood     Status: Abnormal   Collection Time: 01/29/21 11:17 AM  Result Value Ref Range   Hemoglobin 8.1 (L) 13.0 - 17.0 g/dL   HCT 42.5 (L) 95.6 - 38.7 %    ASSESSMENT: Kenneth Bautista is a 35 y.o. male, 2 Days Post-Op s/p Procedure(s): EXTERNAL RIGHT FIXATION KNEE ORIF LEFT PROXIMAL TIBIA FRACTURE ORIF RIGHT ELBOW/OLECRANON FRACTURE  CV/Blood loss: Acute blood loss anemia, Hgb 6.5 this AM. Received  1 unit PRBCs this morning  PLAN: Weightbearing: NWB RUE, RLE, and LLE ROM: - RLE: Maintain ex-fix - RUE: Ok for elbow motion as tolerated - LLE: Ok for knee motion as tolerated Incisional and dressing care: Reinforce dressings as needed  Showering: Bed bath for now Orthopedic device(s):  Ex-Fix RLE, sling for comfort RUE   Pain management:  1. Tylenol 1000 mg q 6 hours scheduled 2. Robaxin 1000 mg q 8 hours PRN 3. Oxycodone 5-15 mg q 4 hours PRN 4. Dilaudid 0.5-1 mg q 4 hours PRN VTE prophylaxis: Hold chemical prophyalxis until Hgb stabilizes.  Okay to start Lovenox from orthopedic standpoint once cleared by neurosurgery ID:  Ancef 2gm post op per open fracture protocol Foley/Lines: Foley in place, KVO IVFs Impediments to Fracture Healing: Polytrauma.  Vitamin D level 28, start on D3 supplementation Dispo: PT/OT once able. Patient will be NWB LLE  for at least 4-6 weeks. Plan for OR Thursday with Dr. Everardo Pacific to address right knee.  Follow - up plan: Will continue to follow while in hospital and plan for outpatient follow-up upon discharge  Contact information:  Truitt Merle MD, Ulyses Southward PA-C. After hours and holidays please check [email or web address] for group call information for [facility]   Sarah A. Michaelyn Barter, PA-C [phone] (office) [email or web address]
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
## 2021-01-29 NOTE — TOC CAGE-AID Note (Signed)
Transition of Care [facility]) - CAGE-AID Screening   Patient Details  Name: Kenneth Bautista MRN: [id number] Date of Birth: 07-10-1985  Transition of Care [place]) CM/SW Contact:    Lossie Faes Tarpley-Carter, LCSWA Phone Number: 01/29/2021, 1:58 PM   Clinical Narrative: Pt participated in Cage-Aid.  Pt stated he does not use substance or ETOH.  Pt was not offered resources, due to no usage of substance or ETOH.     Tacoya Altizer Tarpley-Carter, MSW, LCSW-A Pronouns:  She/Her/Hers [facility]itions [facility] Clinical Social Worker Direct Number:  [phone] [name]   CAGE-AID Screening:    Have You Ever Felt You Ought to Cut Down on Your Drinking or Drug Use?: No Have People Annoyed You By [employer] Your Drinking Or Drug Use?: No Have You Felt Bad Or Guilty About Your Drinking Or Drug Use?: No Have You Ever Had a Drink or Used Drugs First Thing In The Morning to Steady Your Nerves or to Get Rid of a Hangover?: No CAGE-AID Score: 0  Substance Abuse Education Offered: No

## 2021-01-29 NOTE — Progress Notes (Signed)
PT Cancellation Note  Patient Details Name: Kenneth Bautista MRN: [id number] DOB: Jul 22, 1985   Cancelled Treatment:    Reason Eval/Treat Not Completed: Other (comment) Discussed with RN; pt is pending potential surgery, is NPO and therefore has not received pain medication. He is also asleep. Will continue efforts.  Lillia Pauls, PT, DPT Acute Rehabilitation Services Pager [phone] Office [phone]    Kenneth Bautista 01/29/2021, 3:53 PM
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
## 2021-01-30 LAB — TYPE AND SCREEN
ABO/RH(D): A POS
Antibody Screen: NEGATIVE
Unit division: 0

## 2021-01-30 LAB — CBC
HCT: 23.3 % — ABNORMAL LOW (ref 39.0–52.0)
Hemoglobin: 8.1 g/dL — ABNORMAL LOW (ref 13.0–17.0)
MCH: 31.6 pg (ref 26.0–34.0)
MCHC: 34.8 g/dL (ref 30.0–36.0)
MCV: 91 fL (ref 80.0–100.0)
Platelets: 168 10*3/uL (ref 150–400)
RBC: 2.56 MIL/uL — ABNORMAL LOW (ref 4.22–5.81)
RDW: 13.3 % (ref 11.5–15.5)
WBC: 5.9 10*3/uL (ref 4.0–10.5)
nRBC: 0 % (ref 0.0–0.2)

## 2021-01-30 LAB — BASIC METABOLIC PANEL
Anion gap: 8 (ref 5–15)
BUN: 14 mg/dL (ref 6–20)
CO2: 21 mmol/L — ABNORMAL LOW (ref 22–32)
Calcium: 8.3 mg/dL — ABNORMAL LOW (ref 8.9–10.3)
Chloride: 105 mmol/L (ref 98–111)
Creatinine, Ser: 0.78 mg/dL (ref 0.61–1.24)
GFR, Estimated: >60 mL/min (ref >60)
Glucose, Bld: 95 mg/dL (ref 70–99)
Potassium: 3.6 mmol/L (ref 3.5–5.1)
Sodium: 134 mmol/L — ABNORMAL LOW (ref 135–145)

## 2021-01-30 LAB — BPAM RBC
Blood Product Expiration Date: 202209162359
ISSUE DATE / TIME: 202208290912
Unit Type and Rh: 6200

## 2021-01-30 MED ORDER — POLYETHYLENE GLYCOL 3350 17 G PO PACK
17.0000 g | PACK | Freq: Every day | ORAL | Status: DC
  Administered 2021-01-30 – 2021-02-12 (×9): 17 g via ORAL
  Filled 2021-01-30 (×14): qty 1

## 2021-01-30 MED ORDER — CEFAZOLIN SODIUM-DEXTROSE 2-4 GM/100ML-% IV SOLN
2.0000 g | INTRAVENOUS | Status: AC
  Administered 2021-02-01: 2 g via INTRAVENOUS
  Filled 2021-01-30 (×2): qty 100

## 2021-01-30 NOTE — Progress Notes (Signed)
Inpatient Rehab Admissions Coordinator Note:   Per therapy recommendations, patient was screened for CIR candidacy by Stephania Fragmin, PT. At this time, note pt pending further surgeries.  Will follow from a distance for completion of all surgeries and post surgical therapy session before rescreening for CIR.  Please contact me any with questions.Estill Dooms, PT, DPT [phone] 01/30/21 1:15 PM

## 2021-01-30 NOTE — Plan of Care (Addendum)
Alert and oriented. Follows commands. Occasional c/o pain, prn pain medication administered as ordered. On room air, voiding adequately. No BM this admit, PRN miralax also helps. External fixator in place.  Problem: Clinical Measurements: Goal: Ability to maintain clinical measurements within normal limits will improve Outcome: Progressing   Problem: Pain Managment: Goal: General experience of comfort will improve Outcome: Progressing   Problem: Education: Goal: Required Educational Video(s) Outcome: Progressing
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
## 2021-01-31 LAB — CBC
HCT: 24.2 % — ABNORMAL LOW (ref 39.0–52.0)
Hemoglobin: 8.5 g/dL — ABNORMAL LOW (ref 13.0–17.0)
MCH: 31.7 pg (ref 26.0–34.0)
MCHC: 35.1 g/dL (ref 30.0–36.0)
MCV: 90.3 fL (ref 80.0–100.0)
Platelets: 188 10*3/uL (ref 150–400)
RBC: 2.68 MIL/uL — ABNORMAL LOW (ref 4.22–5.81)
RDW: 13.1 % (ref 11.5–15.5)
WBC: 5.5 10*3/uL (ref 4.0–10.5)
nRBC: 0.5 % — ABNORMAL HIGH (ref 0.0–0.2)

## 2021-01-31 LAB — BASIC METABOLIC PANEL
Anion gap: 8 (ref 5–15)
BUN: 13 mg/dL (ref 6–20)
CO2: 22 mmol/L (ref 22–32)
Calcium: 8.1 mg/dL — ABNORMAL LOW (ref 8.9–10.3)
Chloride: 103 mmol/L (ref 98–111)
Creatinine, Ser: 0.68 mg/dL (ref 0.61–1.24)
GFR, Estimated: >60 mL/min (ref >60)
Glucose, Bld: 106 mg/dL — ABNORMAL HIGH (ref 70–99)
Potassium: 3.4 mmol/L — ABNORMAL LOW (ref 3.5–5.1)
Sodium: 133 mmol/L — ABNORMAL LOW (ref 135–145)

## 2021-01-31 MED ORDER — ENSURE PRE-SURGERY PO LIQD
296.0000 mL | Freq: Once | ORAL | Status: AC
  Administered 2021-02-01: 296 mL via ORAL
  Filled 2021-01-31 (×2): qty 296

## 2021-01-31 MED ORDER — POTASSIUM CHLORIDE CRYS ER 20 MEQ PO TBCR
40.0000 meq | EXTENDED_RELEASE_TABLET | Freq: Once | ORAL | Status: AC
  Administered 2021-01-31: 40 meq via ORAL
  Filled 2021-01-31: qty 2

## 2021-01-31 MED ORDER — TAMSULOSIN HCL 0.4 MG PO CAPS: 0.4000 mg | ORAL_CAPSULE | Freq: Every day | ORAL | Status: DC
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
## 2021-01-31 NOTE — Plan of Care (Signed)
  Problem: Education: Goal: Knowledge of General Education information will improve Description: Including pain rating scale, medication(s)/side effects and non-pharmacologic comfort measures Outcome: Progressing   Problem: Health Behavior/Discharge Planning: Goal: Ability to manage health-related needs will improve Outcome: Progressing   Problem: Clinical Measurements: Goal: Ability to maintain clinical measurements within normal limits will improve Outcome: Progressing Goal: Will remain free from infection Outcome: Progressing Goal: Diagnostic test results will improve Outcome: Progressing Goal: Respiratory complications will improve Outcome: Progressing Goal: Cardiovascular complication will be avoided Outcome: Progressing   Problem: Activity: Goal: Risk for activity intolerance will decrease Outcome: Progressing   Problem: Nutrition: Goal: Adequate nutrition will be maintained Outcome: Progressing   Problem: Coping: Goal: Level of anxiety will decrease Outcome: Progressing   Problem: Elimination: Goal: Will not experience complications related to bowel motility Outcome: Progressing Goal: Will not experience complications related to urinary retention Outcome: Progressing   Problem: Pain Managment: Goal: General experience of comfort will improve Outcome: Progressing   Problem: Safety: Goal: Ability to remain free from injury will improve Outcome: Progressing   Problem: Skin Integrity: Goal: Risk for impaired skin integrity will decrease Outcome: Progressing   Problem: Clinical Measurements: Goal: Ability to maintain clinical measurements within normal limits will improve Outcome: Progressing Goal: Postoperative complications will be avoided or minimized Outcome: Progressing   Problem: Skin Integrity: Goal: Demonstration of wound healing without infection will improve Outcome: Progressing   
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
## 2021-02-01 ENCOUNTER — Inpatient Hospital Stay (HOSPITAL_COMMUNITY): Payer: 59 | Admitting: Anesthesiology

## 2021-02-01 ENCOUNTER — Encounter (HOSPITAL_COMMUNITY): Payer: Self-pay

## 2021-02-01 ENCOUNTER — Inpatient Hospital Stay (HOSPITAL_COMMUNITY): Payer: 59

## 2021-02-01 ENCOUNTER — Encounter (HOSPITAL_COMMUNITY): Admission: EM | Disposition: A | Discharge status: Rehabilitation Facility | Payer: Self-pay | Source: Home / Self Care

## 2021-02-01 HISTORY — PX: ANTERIOR CRUCIATE LIGAMENT REPAIR: SHX115

## 2021-02-01 HISTORY — PX: POSTERIOR CRUCIATE LIGAMENT RECONSTRUCTION: SHX749

## 2021-02-01 LAB — CBC
HCT: 25.1 % — ABNORMAL LOW (ref 39.0–52.0)
Hemoglobin: 9 g/dL — ABNORMAL LOW (ref 13.0–17.0)
MCH: 32.1 pg (ref 26.0–34.0)
MCHC: 35.9 g/dL (ref 30.0–36.0)
MCV: 89.6 fL (ref 80.0–100.0)
Platelets: 239 10*3/uL (ref 150–400)
RBC: 2.8 MIL/uL — ABNORMAL LOW (ref 4.22–5.81)
RDW: 13.2 % (ref 11.5–15.5)
WBC: 8.2 10*3/uL (ref 4.0–10.5)
nRBC: 0.2 % (ref 0.0–0.2)

## 2021-02-01 LAB — BASIC METABOLIC PANEL
Anion gap: 10 (ref 5–15)
BUN: 13 mg/dL (ref 6–20)
CO2: 20 mmol/L — ABNORMAL LOW (ref 22–32)
Calcium: 8.3 mg/dL — ABNORMAL LOW (ref 8.9–10.3)
Chloride: 102 mmol/L (ref 98–111)
Creatinine, Ser: 0.66 mg/dL (ref 0.61–1.24)
GFR, Estimated: >60 mL/min (ref >60)
Glucose, Bld: 123 mg/dL — ABNORMAL HIGH (ref 70–99)
Potassium: 3.6 mmol/L (ref 3.5–5.1)
Sodium: 132 mmol/L — ABNORMAL LOW (ref 135–145)

## 2021-02-01 SURGERY — RECONSTRUCTION, KNEE, ACL
Anesthesia: General | Site: Knee | Laterality: Right

## 2021-02-01 MED ORDER — MIDAZOLAM HCL 2 MG/2ML IJ SOLN: 2.0000 mg | Freq: Once | INTRAMUSCULAR | Status: AC

## 2021-02-01 MED ORDER — TOBRAMYCIN SULFATE 1.2 G IJ SOLR
INTRAMUSCULAR | Status: DC | PRN
  Administered 2021-02-01: 1.2 g

## 2021-02-01 MED ORDER — KETOROLAC TROMETHAMINE 15 MG/ML IJ SOLN
15.0000 mg | Freq: Four times a day (QID) | INTRAMUSCULAR | Status: AC
Start: 2021-02-01 — End: 2021-02-02
  Administered 2021-02-01 – 2021-02-02 (×4): 15 mg via INTRAVENOUS
  Filled 2021-02-01 (×4): qty 1

## 2021-02-01 MED ORDER — BUPIVACAINE HCL (PF) 0.5 % IJ SOLN
INTRAMUSCULAR | Status: AC
  Filled 2021-02-01: qty 30

## 2021-02-01 MED ORDER — METHOCARBAMOL 1000 MG/10ML IJ SOLN
500.0000 mg | Freq: Four times a day (QID) | INTRAVENOUS | Status: DC | PRN
  Filled 2021-02-01: qty 5

## 2021-02-01 MED ORDER — METHOCARBAMOL 500 MG PO TABS
500.0000 mg | ORAL_TABLET | Freq: Four times a day (QID) | ORAL | Status: DC | PRN
  Administered 2021-02-03 – 2021-02-06 (×3): 500 mg via ORAL
  Filled 2021-02-01 (×3): qty 1

## 2021-02-01 MED ORDER — CHLORHEXIDINE GLUCONATE 0.12 % MT SOLN: 15.0000 mL | Freq: Once | OROMUCOSAL | Status: AC

## 2021-02-01 MED ORDER — BUPIVACAINE HCL (PF) 0.25 % IJ SOLN
INTRAMUSCULAR | Status: AC
  Filled 2021-02-01: qty 30

## 2021-02-01 MED ORDER — PHENYLEPHRINE 40 MCG/ML (10ML) SYRINGE FOR IV PUSH (FOR BLOOD PRESSURE SUPPORT)
PREFILLED_SYRINGE | INTRAVENOUS | Status: AC
  Filled 2021-02-01: qty 10

## 2021-02-01 MED ORDER — ROPIVACAINE HCL 5 MG/ML IJ SOLN
INTRAMUSCULAR | Status: DC | PRN
  Administered 2021-02-01: 30 mL via PERINEURAL

## 2021-02-01 MED ORDER — MIDAZOLAM HCL 2 MG/2ML IJ SOLN
INTRAMUSCULAR | Status: AC
  Filled 2021-02-01: qty 2

## 2021-02-01 MED ORDER — METOCLOPRAMIDE HCL 5 MG PO TABS: 5.0000 mg | ORAL_TABLET | Freq: Three times a day (TID) | ORAL | Status: DC | PRN

## 2021-02-01 MED ORDER — ACETAMINOPHEN 10 MG/ML IV SOLN
INTRAVENOUS | Status: DC | PRN
  Administered 2021-02-01: 1000 mg via INTRAVENOUS

## 2021-02-01 MED ORDER — MIDAZOLAM HCL 5 MG/5ML IJ SOLN
INTRAMUSCULAR | Status: DC | PRN
  Administered 2021-02-01: 2 mg via INTRAVENOUS

## 2021-02-01 MED ORDER — ONDANSETRON HCL 4 MG/2ML IJ SOLN: 4.0000 mg | Freq: Once | INTRAMUSCULAR | Status: DC | PRN

## 2021-02-01 MED ORDER — FENTANYL CITRATE (PF) 250 MCG/5ML IJ SOLN
INTRAMUSCULAR | Status: AC
  Filled 2021-02-01: qty 5

## 2021-02-01 MED ORDER — PROPOFOL 10 MG/ML IV BOLUS
INTRAVENOUS | Status: DC | PRN
  Administered 2021-02-01: 200 mg via INTRAVENOUS

## 2021-02-01 MED ORDER — SODIUM CHLORIDE 0.9 % IR SOLN
Status: DC | PRN
  Administered 2021-02-01: 9000 mL
  Administered 2021-02-01: 1000 mL

## 2021-02-01 MED ORDER — ROCURONIUM BROMIDE 100 MG/10ML IV SOLN
INTRAVENOUS | Status: DC | PRN
Start: 2021-02-01 — End: 2021-02-01
  Administered 2021-02-01: 70 mg via INTRAVENOUS
  Administered 2021-02-01: 30 mg via INTRAVENOUS

## 2021-02-01 MED ORDER — OXYCODONE HCL 5 MG PO TABS
5.0000 mg | ORAL_TABLET | ORAL | Status: DC | PRN
Start: 2021-02-01 — End: 2021-02-12
  Administered 2021-02-02 – 2021-02-12 (×6): 10 mg via ORAL
  Filled 2021-02-01 (×8): qty 2

## 2021-02-01 MED ORDER — FENTANYL CITRATE (PF) 100 MCG/2ML IJ SOLN
INTRAMUSCULAR | Status: DC | PRN
  Administered 2021-02-01 (×2): 50 ug via INTRAVENOUS
  Administered 2021-02-01: 100 ug via INTRAVENOUS
  Administered 2021-02-01: 50 ug via INTRAVENOUS
  Administered 2021-02-01: 100 ug via INTRAVENOUS
  Administered 2021-02-01: 50 ug via INTRAVENOUS
  Administered 2021-02-01: 100 ug via INTRAVENOUS

## 2021-02-01 MED ORDER — VANCOMYCIN HCL 1000 MG IV SOLR
INTRAVENOUS | Status: AC
  Filled 2021-02-01: qty 20

## 2021-02-01 MED ORDER — DEXMEDETOMIDINE (PRECEDEX) IN NS 20 MCG/5ML (4 MCG/ML) IV SYRINGE
PREFILLED_SYRINGE | INTRAVENOUS | Status: DC | PRN
  Administered 2021-02-01 (×2): 8 ug via INTRAVENOUS
  Administered 2021-02-01: 4 ug via INTRAVENOUS

## 2021-02-01 MED ORDER — DOCUSATE SODIUM 100 MG PO CAPS
100.0000 mg | ORAL_CAPSULE | Freq: Two times a day (BID) | ORAL | Status: DC
  Administered 2021-02-01 – 2021-02-12 (×19): 100 mg via ORAL
  Filled 2021-02-01 (×23): qty 1

## 2021-02-01 MED ORDER — ONDANSETRON HCL 4 MG/2ML IJ SOLN
4.0000 mg | Freq: Four times a day (QID) | INTRAMUSCULAR | Status: DC | PRN
  Administered 2021-02-03: 4 mg via INTRAVENOUS
  Filled 2021-02-01: qty 2

## 2021-02-01 MED ORDER — HYDROMORPHONE HCL 1 MG/ML IJ SOLN
0.5000 mg | INTRAMUSCULAR | Status: DC | PRN
Start: 2021-02-01 — End: 2021-02-06
  Administered 2021-02-02 – 2021-02-05 (×10): 1 mg via INTRAVENOUS
  Filled 2021-02-01 (×11): qty 1

## 2021-02-01 MED ORDER — CHLORHEXIDINE GLUCONATE 0.12 % MT SOLN
OROMUCOSAL | Status: AC
  Administered 2021-02-01: 15 mL via OROMUCOSAL
  Filled 2021-02-01: qty 15

## 2021-02-01 MED ORDER — FENTANYL CITRATE (PF) 100 MCG/2ML IJ SOLN: 100.0000 ug | Freq: Once | INTRAMUSCULAR | Status: AC

## 2021-02-01 MED ORDER — OXYCODONE HCL 5 MG PO TABS: 5.0000 mg | ORAL_TABLET | Freq: Once | ORAL | Status: DC | PRN

## 2021-02-01 MED ORDER — VANCOMYCIN HCL 1000 MG IV SOLR
INTRAVENOUS | Status: DC | PRN
  Administered 2021-02-01: 1000 mg

## 2021-02-01 MED ORDER — LACTATED RINGERS IV SOLN: INTRAVENOUS | Status: DC

## 2021-02-01 MED ORDER — PHENYLEPHRINE HCL (PRESSORS) 10 MG/ML IV SOLN
INTRAVENOUS | Status: DC | PRN
  Administered 2021-02-01: 80 ug via INTRAVENOUS

## 2021-02-01 MED ORDER — HYDROMORPHONE HCL 1 MG/ML IJ SOLN
0.2500 mg | INTRAMUSCULAR | Status: DC | PRN
  Administered 2021-02-01 (×2): 0.25 mg via INTRAVENOUS

## 2021-02-01 MED ORDER — ENOXAPARIN SODIUM 30 MG/0.3ML IJ SOSY: 30.0000 mg | PREFILLED_SYRINGE | Freq: Two times a day (BID) | INTRAMUSCULAR | Status: DC

## 2021-02-01 MED ORDER — ONDANSETRON HCL 4 MG/2ML IJ SOLN
INTRAMUSCULAR | Status: DC | PRN
  Administered 2021-02-01: 4 mg via INTRAVENOUS

## 2021-02-01 MED ORDER — AMISULPRIDE (ANTIEMETIC) 5 MG/2ML IV SOLN: 10.0000 mg | Freq: Once | INTRAVENOUS | Status: DC | PRN

## 2021-02-01 MED ORDER — DIPHENHYDRAMINE HCL 12.5 MG/5ML PO ELIX
12.5000 mg | ORAL_SOLUTION | ORAL | Status: DC | PRN
  Administered 2021-02-07 – 2021-02-10 (×4): 25 mg via ORAL
  Filled 2021-02-01 (×4): qty 10

## 2021-02-01 MED ORDER — ENOXAPARIN SODIUM 30 MG/0.3ML IJ SOSY
30.0000 mg | PREFILLED_SYRINGE | Freq: Two times a day (BID) | INTRAMUSCULAR | Status: DC
  Administered 2021-02-02 – 2021-02-12 (×21): 30 mg via SUBCUTANEOUS
  Filled 2021-02-01 (×21): qty 0.3

## 2021-02-01 MED ORDER — FENTANYL CITRATE (PF) 100 MCG/2ML IJ SOLN
INTRAMUSCULAR | Status: AC
  Administered 2021-02-01: 100 ug via INTRAVENOUS
  Filled 2021-02-01: qty 2

## 2021-02-01 MED ORDER — LIDOCAINE 2% (20 MG/ML) 5 ML SYRINGE
INTRAMUSCULAR | Status: AC
  Filled 2021-02-01: qty 5

## 2021-02-01 MED ORDER — OXYCODONE HCL 5 MG PO TABS
10.0000 mg | ORAL_TABLET | ORAL | Status: DC | PRN
Start: 2021-02-01 — End: 2021-02-12
  Administered 2021-02-02 – 2021-02-11 (×35): 15 mg via ORAL
  Administered 2021-02-12: 10 mg via ORAL
  Administered 2021-02-12: 15 mg via ORAL
  Filled 2021-02-01 (×37): qty 3

## 2021-02-01 MED ORDER — HYDROMORPHONE HCL 1 MG/ML IJ SOLN
INTRAMUSCULAR | Status: AC
  Filled 2021-02-01: qty 1

## 2021-02-01 MED ORDER — DEXAMETHASONE SODIUM PHOSPHATE 10 MG/ML IJ SOLN
INTRAMUSCULAR | Status: AC
  Filled 2021-02-01: qty 1

## 2021-02-01 MED ORDER — LIDOCAINE HCL (CARDIAC) PF 100 MG/5ML IV SOSY
PREFILLED_SYRINGE | INTRAVENOUS | Status: DC | PRN
  Administered 2021-02-01: 100 mg via INTRAVENOUS

## 2021-02-01 MED ORDER — TOBRAMYCIN SULFATE 1.2 G IJ SOLR
INTRAMUSCULAR | Status: AC
  Filled 2021-02-01: qty 1.2

## 2021-02-01 MED ORDER — DEXAMETHASONE SODIUM PHOSPHATE 10 MG/ML IJ SOLN
INTRAMUSCULAR | Status: DC | PRN
  Administered 2021-02-01 (×2): 10 mg

## 2021-02-01 MED ORDER — ACETAMINOPHEN 500 MG PO TABS
1000.0000 mg | ORAL_TABLET | Freq: Four times a day (QID) | ORAL | Status: AC
  Administered 2021-02-01 – 2021-02-05 (×15): 1000 mg via ORAL
  Filled 2021-02-01 (×15): qty 2

## 2021-02-01 MED ORDER — METOCLOPRAMIDE HCL 5 MG/ML IJ SOLN: 5.0000 mg | Freq: Three times a day (TID) | INTRAMUSCULAR | Status: DC | PRN

## 2021-02-01 MED ORDER — SUGAMMADEX SODIUM 500 MG/5ML IV SOLN
INTRAVENOUS | Status: DC | PRN
  Administered 2021-02-01: 200 mg via INTRAVENOUS

## 2021-02-01 MED ORDER — PROPOFOL 10 MG/ML IV BOLUS
INTRAVENOUS | Status: AC
  Filled 2021-02-01: qty 40

## 2021-02-01 MED ORDER — ONDANSETRON HCL 4 MG PO TABS: 4.0000 mg | ORAL_TABLET | Freq: Four times a day (QID) | ORAL | Status: DC | PRN

## 2021-02-01 MED ORDER — ONDANSETRON HCL 4 MG/2ML IJ SOLN
INTRAMUSCULAR | Status: AC
  Filled 2021-02-01: qty 2

## 2021-02-01 MED ORDER — OXYCODONE HCL 5 MG/5ML PO SOLN: 5.0000 mg | Freq: Once | ORAL | Status: DC | PRN

## 2021-02-01 MED ORDER — CEFAZOLIN SODIUM-DEXTROSE 1-4 GM/50ML-% IV SOLN
1.0000 g | Freq: Four times a day (QID) | INTRAVENOUS | Status: AC
Start: 2021-02-01 — End: 2021-02-02
  Administered 2021-02-01 – 2021-02-02 (×3): 1 g via INTRAVENOUS
  Filled 2021-02-01 (×3): qty 50

## 2021-02-01 MED ORDER — MIDAZOLAM HCL 2 MG/2ML IJ SOLN
INTRAMUSCULAR | Status: AC
  Administered 2021-02-01: 2 mg via INTRAVENOUS
  Filled 2021-02-01: qty 2

## 2021-02-01 MED ORDER — KETAMINE HCL 50 MG/5ML IJ SOSY
PREFILLED_SYRINGE | INTRAMUSCULAR | Status: AC
  Filled 2021-02-01: qty 5

## 2021-02-01 MED ORDER — ORAL CARE MOUTH RINSE: 15.0000 mL | Freq: Once | OROMUCOSAL | Status: AC

## 2021-02-01 MED ORDER — KETAMINE HCL 10 MG/ML IJ SOLN
INTRAMUSCULAR | Status: DC | PRN
  Administered 2021-02-01 (×2): 10 mg via INTRAVENOUS
  Administered 2021-02-01: 20 mg via INTRAVENOUS
  Administered 2021-02-01: 10 mg via INTRAVENOUS

## 2021-02-01 SURGICAL SUPPLY — 120 items
ANCH SUT 2-0 CVD FBRSTCH PLSTR (Anchor) ×2 IMPLANT
ANCHOR BUTTON TIGHTROPE 14 (Anchor) ×1 IMPLANT
ANCHOR BUTTON TIGHTROPE II FT (Anchor) ×1 IMPLANT
ANCHOR BUTTON TIGHTROPE II OP (Anchor) ×1 IMPLANT
ANCHOR TIGHTROPE II 20 W/IB (Anchor) ×1 IMPLANT
APL PRP STRL LF DISP 70% ISPRP (MISCELLANEOUS) ×2
BAG COUNTER SPONGE SURGICOUNT (BAG) ×3 IMPLANT
BAG SPNG CNTER NS LX DISP (BAG) ×2
BANDAGE ESMARK 6X9 LF (GAUZE/BANDAGES/DRESSINGS) ×2 IMPLANT
BLADE 4.2CUDA (BLADE) IMPLANT
BLADE AVERAGE 25X9 (BLADE) ×3 IMPLANT
BLADE CUDA 5.5 (BLADE) IMPLANT
BLADE CUDA GRT WHITE 3.5 (BLADE) IMPLANT
BLADE CUTTER GATOR 3.5 (BLADE) ×3 IMPLANT
BLADE CUTTER MENIS 5.5 (BLADE) IMPLANT
BLADE EXCALIBUR 4.0X13 (MISCELLANEOUS) ×3 IMPLANT
BLADE SURG 15 STRL LF DISP TIS (BLADE) ×2 IMPLANT
BLADE SURG 15 STRL SS (BLADE) ×3
BNDG CMPR 9X6 STRL LF SNTH (GAUZE/BANDAGES/DRESSINGS) ×2
BNDG ELASTIC 4X5.8 VLCR STR LF (GAUZE/BANDAGES/DRESSINGS) ×1 IMPLANT
BNDG ELASTIC 6X5.8 VLCR STR LF (GAUZE/BANDAGES/DRESSINGS) ×1 IMPLANT
BNDG ESMARK 6X9 LF (GAUZE/BANDAGES/DRESSINGS) ×3
BNDG GAUZE ELAST 4 BULKY (GAUZE/BANDAGES/DRESSINGS) ×6 IMPLANT
BUR EGG 3PK/BX (BURR) ×3 IMPLANT
BUR OVAL 4.0 (BURR) ×3 IMPLANT
CANNULA TWIST IN 8.25X7CM (CANNULA) ×1 IMPLANT
CHLORAPREP W/TINT 26 (MISCELLANEOUS) ×3 IMPLANT
CLSR STERI-STRIP ANTIMIC 1/2X4 (GAUZE/BANDAGES/DRESSINGS) ×4 IMPLANT
COVER BACK TABLE 60X90IN (DRAPES) ×3 IMPLANT
CUFF TOURN SGL QUICK 34 (TOURNIQUET CUFF)
CUFF TRNQT CYL 34X4.125X (TOURNIQUET CUFF) IMPLANT
CUTTER BONE 4.0MM X 13CM (MISCELLANEOUS) ×1 IMPLANT
CUTTER MENISCUS  4.2MM (BLADE)
CUTTER MENISCUS 4.2MM (BLADE) IMPLANT
CUTTER TENSIONER SUT 2-0 0 FBW (INSTRUMENTS) ×1 IMPLANT
DECANTER SPIKE VIAL GLASS SM (MISCELLANEOUS) IMPLANT
DRAPE ARTHROSCOPY W/POUCH 114 (DRAPES) ×3 IMPLANT
DRAPE C-ARM MINI 42X72 WSTRAPS (DRAPES) ×1 IMPLANT
DRAPE IMP U-DRAPE 54X76 (DRAPES) ×3 IMPLANT
DRAPE U-SHAPE 47X51 STRL (DRAPES) ×3 IMPLANT
DRILL FLIPCUTTER III 6-12 (ORTHOPEDIC DISPOSABLE SUPPLIES) IMPLANT
DRSG EMULSION OIL 3X3 NADH (GAUZE/BANDAGES/DRESSINGS) IMPLANT
DRSG PAD ABDOMINAL 8X10 ST (GAUZE/BANDAGES/DRESSINGS) ×3 IMPLANT
ELECT REM PT RETURN 9FT ADLT (ELECTROSURGICAL) ×3
ELECTRODE REM PT RTRN 9FT ADLT (ELECTROSURGICAL) ×2 IMPLANT
FIBERSTICK 2 (SUTURE) ×1 IMPLANT
FLIPCUTTER III 6-12 AR-1204FF (ORTHOPEDIC DISPOSABLE SUPPLIES) ×3
GAUZE SPONGE 4X4 12PLY STRL (GAUZE/BANDAGES/DRESSINGS) ×6 IMPLANT
GAUZE XEROFORM 5X9 LF (GAUZE/BANDAGES/DRESSINGS) ×1 IMPLANT
GLOVE SRG 8 PF TXTR STRL LF DI (GLOVE) ×2 IMPLANT
GLOVE SURG ENC MOIS LTX SZ6.5 (GLOVE) ×3 IMPLANT
GLOVE SURG LTX SZ8 (GLOVE) ×6 IMPLANT
GLOVE SURG UNDER LTX SZ6.5 (GLOVE) ×3 IMPLANT
GLOVE SURG UNDER POLY LF SZ8 (GLOVE) ×3
GOWN STRL REUS W/ TWL LRG LVL3 (GOWN DISPOSABLE) ×6 IMPLANT
GOWN STRL REUS W/ TWL XL LVL3 (GOWN DISPOSABLE) ×4 IMPLANT
GOWN STRL REUS W/TWL LRG LVL3 (GOWN DISPOSABLE) ×9
GOWN STRL REUS W/TWL XL LVL3 (GOWN DISPOSABLE) ×6
GRAFT ACHILLES CALC BNE BLCK (Bone Implant) IMPLANT
GRAFT ACHILLES TENDON (Bone Implant) ×3 IMPLANT
GRAFT TISS 65-80 FRZN TENDON (Tissue) IMPLANT
GRAFT TISS ANT TIB TNDN (Tissue) IMPLANT
IMMOBILIZER KNEE 22 UNIV (SOFTGOODS) ×3 IMPLANT
IMMOBILIZER KNEE 24 THIGH 36 (MISCELLANEOUS) ×2 IMPLANT
IMMOBILIZER KNEE 24 UNIV (MISCELLANEOUS) ×3 IMPLANT
IMP SYS 2ND FIX PEEK 4.75X19.1 (Miscellaneous) ×3 IMPLANT
IMPL FIBERSTICH 2-0 CVD (Anchor) IMPLANT
IMPL SCREW BIO 8X30 (Screw) IMPLANT
IMPL SYS 2ND FX PEEK 4.75X19.1 (Miscellaneous) IMPLANT
IMPLANT FIBERSTICH 2-0 CVD (Anchor) ×3 IMPLANT
IMPLANT SCREW BIO 8X30 (Screw) ×3 IMPLANT
IV NS IRRIG 3000ML ARTHROMATIC (IV SOLUTION) ×12 IMPLANT
KIT BASIN OR (CUSTOM PROCEDURE TRAY) ×3 IMPLANT
KIT TRANSTIBIAL (DISPOSABLE) ×1 IMPLANT
KNIFE GRAFT ACL 10MM 5952 (MISCELLANEOUS) IMPLANT
KNIFE GRAFT ACL 9MM (MISCELLANEOUS) IMPLANT
MANIFOLD NEPTUNE II (INSTRUMENTS) ×3 IMPLANT
NS IRRIG 1000ML POUR BTL (IV SOLUTION) ×3 IMPLANT
PACK ARTHROSCOPY DSU (CUSTOM PROCEDURE TRAY) ×3 IMPLANT
PAD CAST 4YDX4 CTTN HI CHSV (CAST SUPPLIES) ×2 IMPLANT
PADDING CAST COTTON 4X4 STRL (CAST SUPPLIES) ×3
PADDING CAST COTTON 6X4 STRL (CAST SUPPLIES) ×3 IMPLANT
PASSER SUT SWANSON 36MM LOOP (INSTRUMENTS) IMPLANT
PENCIL BUTTON HOLSTER BLD 10FT (ELECTRODE) IMPLANT
PORT APPOLLO RF 90DEGREE MULTI (SURGICAL WAND) ×1 IMPLANT
PORTAL SKID DEVICE (INSTRUMENTS) ×1 IMPLANT
PROBE BIPOLAR ATHRO 135MM 90D (MISCELLANEOUS) ×3 IMPLANT
SCREW INTERFERENCE FT BC 9X20 (Screw) ×1 IMPLANT
SCREW SHEATHED INTERF 7X20 (Screw) ×1 IMPLANT
SLEEVE SCD COMPRESS KNEE MED (STOCKING) IMPLANT
SLEEVE SURGEON STRL (DRAPES) ×1 IMPLANT
SPONGE T-LAP 4X18 ~~LOC~~+RFID (SPONGE) IMPLANT
SUCTION FRAZIER HANDLE 10FR (MISCELLANEOUS)
SUCTION TUBE FRAZIER 10FR DISP (MISCELLANEOUS) IMPLANT
SUT ETHILON 2 0 FS 18 (SUTURE) ×3 IMPLANT
SUT ETHILON 3 0 PS 1 (SUTURE) IMPLANT
SUT FIBERWIRE #2 38 T-5 BLUE (SUTURE) ×9
SUT MNCRL AB 4-0 PS2 18 (SUTURE) ×1 IMPLANT
SUT MON AB 2-0 CT1 36 (SUTURE) IMPLANT
SUT PROLENE 3 0 PS 2 (SUTURE) IMPLANT
SUT VIC AB 0 CT1 27 (SUTURE) ×6
SUT VIC AB 0 CT1 27XBRD ANBCTR (SUTURE) IMPLANT
SUT VIC AB 0 SH 27 (SUTURE) ×3 IMPLANT
SUT VIC AB 2-0 SH 27 (SUTURE) ×3
SUT VIC AB 2-0 SH 27XBRD (SUTURE) ×2 IMPLANT
SUT VIC AB 3-0 SH 27 (SUTURE) ×6
SUT VIC AB 3-0 SH 27X BRD (SUTURE) IMPLANT
SUT VICRYL 4-0 PS2 18IN ABS (SUTURE) IMPLANT
SUTURE FIBERWR #2 38 T-5 BLUE (SUTURE) ×4 IMPLANT
SYSTEM BICEP REPAIR KNTLS DIST (Anchor) ×1 IMPLANT
TAPE CLOTH 3X10 TAN LF (GAUZE/BANDAGES/DRESSINGS) IMPLANT
TENDON ANTERIOR TIBIALIS (Tissue) ×3 IMPLANT
TISSUE GRAFTLINK 65-95MML (Tissue) ×3 IMPLANT
TOWEL GREEN STERILE FF (TOWEL DISPOSABLE) ×3 IMPLANT
TOWEL OR NON WOVEN STRL DISP B (DISPOSABLE) ×6 IMPLANT
TUBE CONNECTING 12X1/4 (SUCTIONS) ×1 IMPLANT
TUBING ARTHROSCOPY IRRIG 16FT (MISCELLANEOUS) ×3 IMPLANT
WATER STERILE IRR 1000ML POUR (IV SOLUTION) ×3 IMPLANT
WRAP KNEE MAXI GEL POST OP (GAUZE/BANDAGES/DRESSINGS) ×3 IMPLANT
YANKAUER SUCT BULB TIP NO VENT (SUCTIONS) ×1 IMPLANT

## 2021-02-01 NOTE — Interval H&P Note (Signed)
Questions answered patients elected to proceed.
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
## 2021-02-01 NOTE — Transfer of Care (Signed)
Immediate Anesthesia Transfer of Care Note  Patient: Kenneth Bautista  Procedure(s) Performed: RECONSTRUCTION ANTERIOR CRUCIATE LIGAMENT (ACL) (Right: Knee) RECONSTRUCTION POSTERIOR CRUCIATE LIGAMENT (PCL) (Right: Knee) POSTERIOR LATERAL CORNER REPAIR (Right)  Patient Location: PACU  Anesthesia Type:General  Level of Consciousness: awake, alert , oriented and patient cooperative  Airway & Oxygen Therapy: Patient Spontanous Breathing and Patient connected to face mask oxygen  Post-op Assessment: Report given to RN and Post -op Vital signs reviewed and stable  Post vital signs: Reviewed and stable  Last Vitals:  Vitals Value Taken Time  BP    Temp    Pulse    Resp    SpO2      Last Pain:  Vitals:   02/01/21 1049  TempSrc:   PainSc: 0-No pain      Patients Stated Pain Goal: 4 (02/01/21 1022)  Complications: No notable events documented.
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
## 2021-02-01 NOTE — Anesthesia Procedure Notes (Signed)
Anesthesia Regional Block: Femoral nerve block   Pre-Anesthetic Checklist: , timeout performed,  Correct Patient, Correct Site, Correct Laterality,  Correct Procedure, Correct Position, site marked,  Risks and benefits discussed,  Surgical consent,  Pre-op evaluation,  At surgeon's request and post-op pain management  Laterality: Right  Prep: chloraprep       Needles:  Injection technique: Single-shot  Needle Type: Echogenic Stimulator Needle     Needle Length: 9cm  Needle Gauge: 21     Additional Needles:   Procedures:,,,, ultrasound used (permanent image in chart),,    Narrative:  Start time: 02/01/2021 10:42 AM End time: 02/01/2021 10:47 AM Injection made incrementally with aspirations every 5 mL.  Performed by: Personally  Anesthesiologist: Lucretia Kern, MD  Additional Notes: Standard monitors applied. Skin prepped. Good needle visualization with ultrasound. Injection made in 5cc increments with no resistance to injection. Patient tolerated the procedure well.  Addition local anesthetic injected at obturator nerve and LFC with ultrasound guidance.
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
## 2021-02-01 NOTE — Progress Notes (Addendum)
Patient is out of room to OR for procedure.  1550 patient is back to room, Axox4, RA. Is given to patient

## 2021-02-01 NOTE — Plan of Care (Signed)
Pt alert and oriented x 4. Pt received 2 dose of oxycodone 15 mg. Pt refused am tylenol and robaxin. Pt due for surgery this am. Checklist complete. Presurgery drink due 0730 Problem: Education: Goal: Knowledge of General Education information will improve Description: Including pain rating scale, medication(s)/side effects and non-pharmacologic comfort measures Outcome: Progressing   Problem: Health Behavior/Discharge Planning: Goal: Ability to manage health-related needs will improve Outcome: Progressing   Problem: Clinical Measurements: Goal: Ability to maintain clinical measurements within normal limits will improve Outcome: Progressing Goal: Will remain free from infection Outcome: Progressing Goal: Diagnostic test results will improve Outcome: Progressing Goal: Respiratory complications will improve Outcome: Progressing Goal: Cardiovascular complication will be avoided Outcome: Progressing   Problem: Activity: Goal: Risk for activity intolerance will decrease Outcome: Progressing   Problem: Nutrition: Goal: Adequate nutrition will be maintained Outcome: Progressing   Problem: Coping: Goal: Level of anxiety will decrease Outcome: Progressing   Problem: Elimination: Goal: Will not experience complications related to bowel motility Outcome: Progressing Goal: Will not experience complications related to urinary retention Outcome: Progressing   Problem: Pain Managment: Goal: General experience of comfort will improve Outcome: Progressing   Problem: Safety: Goal: Ability to remain free from injury will improve Outcome: Progressing   Problem: Skin Integrity: Goal: Risk for impaired skin integrity will decrease Outcome: Progressing   Problem: Education: Goal: Required Educational Video(s) Outcome: Progressing   Problem: Clinical Measurements: Goal: Ability to maintain clinical measurements within normal limits will improve Outcome: Progressing Goal:  Postoperative complications will be avoided or minimized Outcome: Progressing   Problem: Skin Integrity: Goal: Demonstration of wound healing without infection will improve Outcome: Progressing

## 2021-02-01 NOTE — Anesthesia Postprocedure Evaluation (Signed)
Anesthesia Post Note  Patient: Kenneth Bautista  Procedure(s) Performed: RECONSTRUCTION ANTERIOR CRUCIATE LIGAMENT (ACL) (Right: Knee) RECONSTRUCTION POSTERIOR CRUCIATE LIGAMENT (PCL) (Right: Knee) POSTERIOR LATERAL CORNER REPAIR (Right)     Patient location during evaluation: PACU Anesthesia Type: General Level of consciousness: awake and alert and oriented Pain management: pain level controlled Vital Signs Assessment: post-procedure vital signs reviewed and stable Respiratory status: spontaneous breathing, nonlabored ventilation and respiratory function stable Cardiovascular status: blood pressure returned to baseline and stable Postop Assessment: no apparent nausea or vomiting Anesthetic complications: no   No notable events documented.  Last Vitals:  Vitals:   02/01/21 1453 02/01/21 1508  BP: 131/86 131/71  Pulse: 100 95  Resp: 10 15  Temp:  37.1 C  SpO2: 94% 93%    Last Pain:  Vitals:   02/01/21 1438  TempSrc:   PainSc: 8                  Sachit Gilman A.

## 2021-02-01 NOTE — Anesthesia Procedure Notes (Signed)
Procedure Name: Intubation Date/Time: 02/01/2021 11:06 AM Performed by: Jonna Munro, CRNA Pre-anesthesia Checklist: Patient identified, Emergency Drugs available, Suction available, Patient being monitored and Timeout performed Patient Re-evaluated:Patient Re-evaluated prior to induction Oxygen Delivery Method: Circle system utilized Preoxygenation: Pre-oxygenation with 100% oxygen Induction Type: IV induction Laryngoscope Size: Mac and 3 Grade View: Grade I Tube type: Oral Tube size: 7.5 mm Number of attempts: 1 Airway Equipment and Method: Stylet Placement Confirmation: positive ETCO2, ETT inserted through vocal cords under direct vision and breath sounds checked- equal and bilateral Secured at: 22 cm Tube secured with: Tape Dental Injury: Teeth and Oropharynx as per pre-operative assessment

## 2021-02-01 NOTE — Anesthesia Postprocedure Evaluation (Signed)
Anesthesia Post Note  Patient: Kenneth Bautista  Procedure(s) Performed: RECONSTRUCTION ANTERIOR CRUCIATE LIGAMENT (ACL) (Right: Knee) RECONSTRUCTION POSTERIOR CRUCIATE LIGAMENT (PCL) (Right: Knee) POSTERIOR LATERAL CORNER REPAIR (Right)     Anesthesia Post Evaluation No notable events documented.  Last Vitals:  Vitals:   02/01/21 1453 02/01/21 1508  BP: 131/86 131/71  Pulse: 100 95  Resp: 10 15  Temp:  37.1 C  SpO2: 94% 93%    Last Pain:  Vitals:   02/01/21 1438  TempSrc:   PainSc: 8                  Hae Ahlers A.

## 2021-02-02 ENCOUNTER — Inpatient Hospital Stay (HOSPITAL_COMMUNITY): Payer: 59

## 2021-02-02 DIAGNOSIS — R509 Fever, unspecified: Secondary | ICD-10-CM

## 2021-02-02 LAB — BASIC METABOLIC PANEL
Anion gap: 7 (ref 5–15)
BUN: 16 mg/dL (ref 6–20)
CO2: 23 mmol/L (ref 22–32)
Calcium: 7.9 mg/dL — ABNORMAL LOW (ref 8.9–10.3)
Chloride: 99 mmol/L (ref 98–111)
Creatinine, Ser: 0.75 mg/dL (ref 0.61–1.24)
GFR, Estimated: >60 mL/min (ref >60)
Glucose, Bld: 109 mg/dL — ABNORMAL HIGH (ref 70–99)
Potassium: 3.5 mmol/L (ref 3.5–5.1)
Sodium: 129 mmol/L — ABNORMAL LOW (ref 135–145)

## 2021-02-02 LAB — CBC
HCT: 21.5 % — ABNORMAL LOW (ref 39.0–52.0)
Hemoglobin: 7.4 g/dL — ABNORMAL LOW (ref 13.0–17.0)
MCH: 31.4 pg (ref 26.0–34.0)
MCHC: 34.4 g/dL (ref 30.0–36.0)
MCV: 91.1 fL (ref 80.0–100.0)
Platelets: 234 10*3/uL (ref 150–400)
RBC: 2.36 MIL/uL — ABNORMAL LOW (ref 4.22–5.81)
RDW: 13.8 % (ref 11.5–15.5)
WBC: 4.3 10*3/uL (ref 4.0–10.5)
nRBC: 0.5 % — ABNORMAL HIGH (ref 0.0–0.2)

## 2021-02-02 MED ORDER — ADULT MULTIVITAMIN W/MINERALS CH
1.0000 | ORAL_TABLET | Freq: Every day | ORAL | Status: DC
  Administered 2021-02-02 – 2021-02-12 (×11): 1 via ORAL
  Filled 2021-02-02 (×11): qty 1

## 2021-02-02 MED ORDER — ENSURE ENLIVE PO LIQD
237.0000 mL | Freq: Three times a day (TID) | ORAL | Status: DC
  Administered 2021-02-02 – 2021-02-11 (×15): 237 mL via ORAL
  Filled 2021-02-02: qty 237
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
## 2021-02-02 NOTE — Progress Notes (Signed)
OT Cancellation Note  Patient Details Name: MARKEL KURTENBACH MRN: [id number] DOB: [birth year]   Cancelled Treatment:    Reason Eval/Treat Not Completed: Patient at procedure or test/ unavailable.  Will check back as able.    Alessandra Bevels Lorraine-COTA/L 02/02/2021, 10:46 AM

## 2021-02-02 NOTE — Progress Notes (Signed)
   02/02/21 0534  Assess: MEWS Score  Temp (!) 101.2 F (38.4 C)  BP 126/78  Pulse Rate (!) 108  Resp 20  Level of Consciousness Alert  SpO2 100 %  O2 Device Room Air  Assess: MEWS Score  MEWS Temp 1  MEWS Systolic 0  MEWS Pulse 1  MEWS RR 0  MEWS LOC 0  MEWS Score 2  MEWS Score Color Yellow  Assess: if the MEWS score is Yellow or Red  Were vital signs taken at a resting state? No  Focused Assessment No change from prior assessment  Early Detection of Sepsis Score *See Row Information* Low  Treat  MEWS Interventions Administered scheduled meds/treatments  Take Vital Signs  Increase Vital Sign Frequency   (p)  Notify: Provider  Provider Name/Title Janee Morn  Date Provider Notified 02/02/21  Time Provider Notified [phone]  Notification Type Page  Notification Reason Other (Comment) (MEWs score 2)  Provider response No new orders;Other (Comment) (CTM)  Date of Provider Response 02/02/21  Time of Provider Response [phone]  Document  Patient Outcome Stabilized after interventions
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
## 2021-02-02 NOTE — Progress Notes (Signed)
Upper and lower extremity venous duplex has been completed.   Preliminary results in CV Proc.   Lichelle Viets Airyn Ellzey 02/02/2021 11:21 AM
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
## 2021-02-02 NOTE — Progress Notes (Signed)
Orthopedic Tech Progress Note Patient Details:  Kenneth Bautista [birth year] [id number]  Ortho Devices Type of Ortho Device: Ace wrap Ortho Device/Splint Location: Bilateral LE Ortho Device/Splint Interventions: Ordered, Application   Post Interventions Patient Tolerated: Well Instructions Provided: Care of device, Poper ambulation with device  [name] A [name] 02/02/2021, 7:10 PM

## 2021-02-02 NOTE — Progress Notes (Signed)
   ORTHOPAEDIC PROGRESS NOTE  s/p Procedure(s): RECONSTRUCTION ANTERIOR CRUCIATE LIGAMENT (ACL) RECONSTRUCTION POSTERIOR CRUCIATE LIGAMENT (PCL) POSTERIOR LATERAL CORNER REPAIR  SUBJECTIVE: Reports moderate pain about operative site. Has questions about how long he will be in the hospital. He was nauseous this morning, but this is improving. No chest pain. No SOB. No nausea/vomiting. No other complaints.  OBJECTIVE: PE: General: resting comfortably in hospital bed, NAD RUE: dressing CDI with small amount of strikethrough noted, tolerated ROM from 30-90 degrees, + Motor in  AIN, PIN, Ulnar distributions. Sensation intact in medial, radial, and ulnar distributions. Well perfused digits.  RLE: dressing CDI, knee immobilizer in place, compartments swollen but compressible, no pain with passive stretch, he is able to flex and extend all toes, limited ROM of right ankle, this was noted before surgery, he endorses distal sensation, warm well perfused foot LLE: dressing CDI, compartments swollen but compressible, +EHL/+FHL. Not able to elicit much ankle DF/PF, this may be secondary to swelling and pain. Pain with passive DF to neutral. Foot warm and well perfused.    Vitals:   02/02/21 0534 02/02/21 0627  BP: 126/78 126/78  Pulse: (!) 108 96  Resp: 20 18  Temp: (!) 101.2 F (38.4 C) 99.5 F (37.5 C)  SpO2: 100% 100%    ASSESSMENT: Kenneth Bautista is a 35 y.o. male doing well postoperatively. - s/p ORIF left proximal tibia with Dr. Jena Gauss on 01/27/2021 - s/p ORIF right olecranon fracture with Dr. Jena Gauss on 01/27/2021 - s/p Right allograft ACL reconstruction, allograft PCL reconstruction, allograft posterior lateral corner reconstruction, Peroneal nerve neurolysis, Open reduction total fixation of fibular head, Anterolateral ligament reconstruction, Medial meniscal repair, Partial medial meniscectomy, Microfracture medial tibial plateau far medial, Loose body excision, Removal of hardware on  02/01/2021 with Dr. Everardo Pacific  PLAN: Weightbearing:  - RUE: NWB - RLE: NWB  - LLE: NWB ROM: - RUE: okay for elbow motion as tolerated - RLE: straight for one week then can begin ROM as tolerated in hinged knee brace - LLE: okay for knee motion as tolerated Insicional and dressing care: Reinforce dressings as needed Orthopedic device(s):  - RUE: sling for comfort - RLE: knee immobilizer, will order Bledsoe  Showering: Bed bath for now VTE prophylaxis:  Okay to start Lovenox from orthopedic standpoint once cleared by neurosurgery Pain control:  1. Tylenol 1000 mg q 6 hours scheduled 2. Robaxin 1000 mg q 8 hours PRN 3. Oxycodone 5-15 mg q 4 hours PRN 4. Dilaudid 0.5-1 mg q 4 hours PRN Follow - up plan: Will continue to follow while in hospital and plan for outpatient follow-up 2 weeks after d/c Dispo: Therapies as tolerated, PT/OT currently recommending CIR. CIR following and will re-evaluate Contact information:  Dr. Ramond Marrow, Alfonse Alpers PA-C, After hours and holidays please check [email or web address] for group call information for [facility]   Alfonse Alpers, PA-C 02/02/2021

## 2021-02-02 NOTE — Plan of Care (Signed)
  Problem: Clinical Measurements: Goal: Ability to maintain clinical measurements within normal limits will improve Outcome: Progressing Goal: Will remain free from infection Outcome: Progressing Goal: Respiratory complications will improve Outcome: Progressing Goal: Cardiovascular complication will be avoided Outcome: Progressing   

## 2021-02-02 NOTE — Plan of Care (Signed)
  Problem: Clinical Measurements: Goal: Will remain free from infection Outcome: Progressing   Problem: Activity: Goal: Risk for activity intolerance will decrease Outcome: Progressing   Problem: Nutrition: Goal: Adequate nutrition will be maintained Outcome: Progressing   Problem: Pain Managment: Goal: General experience of comfort will improve Outcome: Progressing   Problem: Skin Integrity: Goal: Demonstration of wound healing without infection will improve Outcome: Progressing
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
## 2021-02-02 NOTE — Progress Notes (Signed)
Pt with temp of 101.48F, and tachycardic HR 108 with chills. BP and SpO2 stable. Pt was feeling nauseous and had a small volume clear/brown tinged emesis. Pt had just taken few sips of Pepsi prior to emesis. Recheck of Vs done and stable. Pt without any chills. Pt did receive schedule tylenol and toradol right after initial elevated VS.   Provider updated @ [phone] by page. Awaiting call back.   [phone] call back with CTM.

## 2021-02-03 LAB — CBC
HCT: 23 % — ABNORMAL LOW (ref 39.0–52.0)
Hemoglobin: 8 g/dL — ABNORMAL LOW (ref 13.0–17.0)
MCH: 31.9 pg (ref 26.0–34.0)
MCHC: 34.8 g/dL (ref 30.0–36.0)
MCV: 91.6 fL (ref 80.0–100.0)
Platelets: 263 10*3/uL (ref 150–400)
RBC: 2.51 MIL/uL — ABNORMAL LOW (ref 4.22–5.81)
RDW: 14.2 % (ref 11.5–15.5)
WBC: 4.5 10*3/uL (ref 4.0–10.5)
nRBC: 0.4 % — ABNORMAL HIGH (ref 0.0–0.2)

## 2021-02-03 LAB — BASIC METABOLIC PANEL
Anion gap: 7 (ref 5–15)
BUN: 14 mg/dL (ref 6–20)
CO2: 24 mmol/L (ref 22–32)
Calcium: 8.1 mg/dL — ABNORMAL LOW (ref 8.9–10.3)
Chloride: 100 mmol/L (ref 98–111)
Creatinine, Ser: 0.66 mg/dL (ref 0.61–1.24)
GFR, Estimated: >60 mL/min (ref >60)
Glucose, Bld: 139 mg/dL — ABNORMAL HIGH (ref 70–99)
Potassium: 3.4 mmol/L — ABNORMAL LOW (ref 3.5–5.1)
Sodium: 131 mmol/L — ABNORMAL LOW (ref 135–145)

## 2021-02-03 NOTE — Progress Notes (Signed)
Patient ID: Kenneth Bautista, male   DOB: November 06, 1985, 35 y.o.   MRN: [id number] [facility] Progress Note:   2 Days Post-Op  Subjective: Mental status is clear;  on the phone with his mother.  Complaints no new complaints verbalized. Objective: Vital signs in last 24 hours: Temp:  [98.3 F (36.8 C)-99.5 F (37.5 C)] 99.5 F (37.5 C) (09/03 0728) Pulse Rate:  [61-85] 62 (09/03 0728) Resp:  [16-18] 17 (09/03 0728) BP: (101-135)/(48-71) 114/48 (09/03 0728) SpO2:  [98 %-100 %] 98 % (09/03 0728)  Intake/Output from previous day: 09/02 0701 - 09/03 0700 In: 1080 [P.O.:1080] Out: 1000 [Urine:1000] Intake/Output this shift: No intake/output data recorded.  Physical Exam: Work of breathing is not labored.    Lab Results:  Results for orders placed or performed during the hospital encounter of 01/26/21 (from the past 48 hour(s))  CBC     Status: Abnormal   Collection Time: 02/01/21 10:02 AM  Result Value Ref Range   WBC 8.2 4.0 - 10.5 K/uL   RBC 2.80 (L) 4.22 - 5.81 MIL/uL   Hemoglobin 9.0 (L) 13.0 - 17.0 g/dL   HCT 00.8 (L) 67.6 - 19.5 %   MCV 89.6 80.0 - 100.0 fL   MCH 32.1 26.0 - 34.0 pg   MCHC 35.9 30.0 - 36.0 g/dL   RDW 09.3 26.7 - 12.4 %   Platelets 239 150 - 400 K/uL   nRBC 0.2 0.0 - 0.2 %    Comment: Performed at [facility], [address] [place], [place] [address]  Basic metabolic panel     Status: Abnormal   Collection Time: 02/01/21 10:02 AM  Result Value Ref Range   Sodium 132 (L) 135 - 145 mmol/L   Potassium 3.6 3.5 - 5.1 mmol/L   Chloride 102 98 - 111 mmol/L   CO2 20 (L) 22 - 32 mmol/L   Glucose, Bld 123 (H) 70 - 99 mg/dL    Comment: Glucose reference range applies only to samples taken after fasting for at least 8 hours.   BUN 13 6 - 20 mg/dL   Creatinine, Ser 8.33 0.61 - 1.24 mg/dL   Calcium 8.3 (L) 8.9 - 10.3 mg/dL   GFR, Estimated >82 >50 mL/min    Comment: (NOTE) Calculated using the CKD-EPI Creatinine Equation (2021)    Anion gap 10  5 - 15    Comment: Performed at [facility], [address], [place], [place] [address]  CBC     Status: Abnormal   Collection Time: 02/02/21  8:35 AM  Result Value Ref Range   WBC 4.3 4.0 - 10.5 K/uL   RBC 2.36 (L) 4.22 - 5.81 MIL/uL   Hemoglobin 7.4 (L) 13.0 - 17.0 g/dL   HCT 73.4 (L) 19.3 - 79.0 %   MCV 91.1 80.0 - 100.0 fL   MCH 31.4 26.0 - 34.0 pg   MCHC 34.4 30.0 - 36.0 g/dL   RDW 24.0 97.3 - 53.2 %   Platelets 234 150 - 400 K/uL   nRBC 0.5 (H) 0.0 - 0.2 %    Comment: Performed at [facility], [address], [place], [place] [address]  Basic metabolic panel     Status: Abnormal   Collection Time: 02/02/21  8:35 AM  Result Value Ref Range   Sodium 129 (L) 135 - 145 mmol/L   Potassium 3.5 3.5 - 5.1 mmol/L   Chloride 99 98 - 111 mmol/L   CO2 23  22 - 32 mmol/L   Glucose, Bld 109 (H) 70 - 99 mg/dL    Comment: Glucose reference range applies only to samples taken after fasting for at least 8 hours.   BUN 16 6 - 20 mg/dL   Creatinine, Ser 4.09 0.61 - 1.24 mg/dL   Calcium 7.9 (L) 8.9 - 10.3 mg/dL   GFR, Estimated >81 >19 mL/min    Comment: (NOTE) Calculated using the CKD-EPI Creatinine Equation (2021)    Anion gap 7 5 - 15    Comment: Performed at [facility], [address], [place], [place] [address]  CBC     Status: Abnormal   Collection Time: 02/03/21 12:40 AM  Result Value Ref Range   WBC 4.5 4.0 - 10.5 K/uL   RBC 2.51 (L) 4.22 - 5.81 MIL/uL   Hemoglobin 8.0 (L) 13.0 - 17.0 g/dL   HCT 95.6 (L) 21.3 - 08.6 %   MCV 91.6 80.0 - 100.0 fL   MCH 31.9 26.0 - 34.0 pg   MCHC 34.8 30.0 - 36.0 g/dL   RDW 57.8 46.9 - 62.9 %   Platelets 263 150 - 400 K/uL   nRBC 0.4 (H) 0.0 - 0.2 %    Comment: Performed at [facility], [address], [place], [place] [address]  Basic metabolic panel     Status: Abnormal   Collection Time: 02/03/21 12:40 AM  Result Value Ref Range   Sodium 131 (L) 135 - 145 mmol/L   Potassium 3.4 (L) 3.5 - 5.1 mmol/L   Chloride  100 98 - 111 mmol/L   CO2 24 22 - 32 mmol/L   Glucose, Bld 139 (H) 70 - 99 mg/dL    Comment: Glucose reference range applies only to samples taken after fasting for at least 8 hours.   BUN 14 6 - 20 mg/dL   Creatinine, Ser 3.24 0.61 - 1.24 mg/dL   Calcium 8.1 (L) 8.9 - 10.3 mg/dL   GFR, Estimated >40 >10 mL/min    Comment: (NOTE) Calculated using the CKD-EPI Creatinine Equation (2021)    Anion gap 7 5 - 15    Comment: Performed at [facility], [address] [place], [place] [address]    Radiology/Results: DG Knee Right Port  Result Date: 02/01/2021 CLINICAL DATA:  Postop EXAM: PORTABLE RIGHT KNEE - 1-2 VIEW COMPARISON:  01/27/2021 FINDINGS: Removal of external fixators. Postsurgical changes involving the knee. Soft tissue gas. Prior fibular head fracture, with improved alignment/position of the proximal fracture fragments. IMPRESSION: Postsurgical changes, as above. Electronically Signed   By: Charline Bills M.D.   On: 02/01/2021 20:32   DG MINI C-ARM IMAGE ONLY  Result Date: 02/01/2021 There is no interpretation for this exam.  This order is for images obtained during a surgical procedure.  Please See "Surgeries" Tab for more information regarding the procedure.   VAS [name] LOWER EXTREMITY VENOUS (DVT)  Result Date: 02/02/2021  Lower Venous DVT Study Patient Name:  Kenneth Bautista  Date of Exam:   02/02/2021 Medical Rec #: [id number]       Accession #:    [id number] Date of Birth: Jul 01, 1985       Patient Gender: M Patient Age:   50 years Exam Location:  [facility] Procedure:      VAS [name] LOWER EXTREMITY VENOUS (DVT) Referring Phys: Tresa Endo OSBORNE --------------------------------------------------------------------------------  Indications: Fever, multiple fractures, high risk.  Limitations: Pain tolerance. Comparison Study: no prior Performing Technologist: [place] Ponder RVS  Examination Guidelines: A complete evaluation includes B-mode imaging, spectral Doppler, color Doppler,  and power Doppler as needed of all accessible portions of each vessel. Bilateral testing is considered an integral part of a complete examination. Limited examinations for reoccurring indications may be performed as noted. The reflux portion of the exam is performed with the patient in reverse Trendelenburg.  +---------+---------------+---------+-----------+----------+--------------+ RIGHT    CompressibilityPhasicitySpontaneityPropertiesThrombus Aging +---------+---------------+---------+-----------+----------+--------------+ CFV      Full           Yes      Yes                                 +---------+---------------+---------+-----------+----------+--------------+ SFJ      Full                                                        +---------+---------------+---------+-----------+----------+--------------+ FV Prox  Full                                                        +---------+---------------+---------+-----------+----------+--------------+ FV Mid   Full                                                        +---------+---------------+---------+-----------+----------+--------------+ FV DistalFull                                                        +---------+---------------+---------+-----------+----------+--------------+ PFV      Full                                                        +---------+---------------+---------+-----------+----------+--------------+ POP      Full           Yes      Yes                                 +---------+---------------+---------+-----------+----------+--------------+ PTV      Full                                                        +---------+---------------+---------+-----------+----------+--------------+ PERO     Full                                                        +---------+---------------+---------+-----------+----------+--------------+    +---------+---------------+---------+-----------+----------+-------------------+  LEFT     CompressibilityPhasicitySpontaneityPropertiesThrombus Aging      +---------+---------------+---------+-----------+----------+-------------------+ CFV      Full           Yes      Yes                                      +---------+---------------+---------+-----------+----------+-------------------+ SFJ      Full                                                             +---------+---------------+---------+-----------+----------+-------------------+ FV Prox  Full                                                             +---------+---------------+---------+-----------+----------+-------------------+ FV Mid   Full                                                             +---------+---------------+---------+-----------+----------+-------------------+ FV DistalFull                                                             +---------+---------------+---------+-----------+----------+-------------------+ PFV      Full                                                             +---------+---------------+---------+-----------+----------+-------------------+ POP      Full           Yes      Yes                                      +---------+---------------+---------+-----------+----------+-------------------+ PTV      Full                                                             +---------+---------------+---------+-----------+----------+-------------------+ PERO                                                  Not well visualized +---------+---------------+---------+-----------+----------+-------------------+     Summary: BILATERAL: - No evidence of deep  vein thrombosis seen in the lower extremities, bilaterally. -No evidence of popliteal cyst, bilaterally.   *See table(s) above for measurements and observations. Electronically signed by Waverly Ferrari MD on 02/02/2021 at 12:50:30 PM.    Final    VAS [name] UPPER EXTREMITY VENOUS DUPLEX  Result Date: 02/02/2021 UPPER VENOUS STUDY  Patient Name:  NORLAN RANN  Date of Exam:   02/02/2021 Medical Rec #: [id number]       Accession #:    [id number] Date of Birth: [birth year]       Patient Gender: M Patient Age:   26 years Exam Location:  [facility] Procedure:      VAS [name] UPPER EXTREMITY VENOUS DUPLEX Referring Phys: Tresa Endo OSBORNE --------------------------------------------------------------------------------  Other Indications: Fever, multiple fractures, high risk. Comparison Study: no prior Performing Technologist: [place] Ponder RVS  Examination Guidelines: A complete evaluation includes B-mode imaging, spectral Doppler, color Doppler, and power Doppler as needed of all accessible portions of each vessel. Bilateral testing is considered an integral part of a complete examination. Limited examinations for reoccurring indications may be performed as noted.  Right Findings: +----------+------------+---------+-----------+----------+--------------+ RIGHT     CompressiblePhasicitySpontaneousProperties   Summary     +----------+------------+---------+-----------+----------+--------------+ IJV           Full       Yes       Yes                             +----------+------------+---------+-----------+----------+--------------+ Subclavian    Full       Yes       Yes                             +----------+------------+---------+-----------+----------+--------------+ Axillary      Full       Yes       Yes                             +----------+------------+---------+-----------+----------+--------------+ Brachial      Full       Yes       Yes                             +----------+------------+---------+-----------+----------+--------------+ Radial        Full                                                  +----------+------------+---------+-----------+----------+--------------+ Ulnar         Full                                                 +----------+------------+---------+-----------+----------+--------------+ Cephalic                                            Not visualized +----------+------------+---------+-----------+----------+--------------+ Basilic       Full                                                 +----------+------------+---------+-----------+----------+--------------+  Left Findings: +----------+------------+---------+-----------+----------+--------------+ LEFT      CompressiblePhasicitySpontaneousProperties   Summary     +----------+------------+---------+-----------+----------+--------------+ IJV           Full       Yes       Yes                             +----------+------------+---------+-----------+----------+--------------+ Subclavian    Full       Yes       Yes                             +----------+------------+---------+-----------+----------+--------------+ Axillary      Full       Yes       Yes                             +----------+------------+---------+-----------+----------+--------------+ Brachial      Full       Yes       Yes                             +----------+------------+---------+-----------+----------+--------------+ Radial        Full                                                 +----------+------------+---------+-----------+----------+--------------+ Ulnar         Full                                                 +----------+------------+---------+-----------+----------+--------------+ Cephalic                                            Not visualized +----------+------------+---------+-----------+----------+--------------+ Basilic       Full                                                 +----------+------------+---------+-----------+----------+--------------+  Summary:   Right: No evidence of deep vein thrombosis in the upper extremity. No evidence of superficial vein thrombosis in the upper extremity.  Left: No evidence of deep vein thrombosis in the upper extremity. No evidence of superficial vein thrombosis in the upper extremity.  *See table(s) above for measurements and observations.  Diagnosing physician: Waverly Ferrarihristopher Dickson MD Electronically signed by Waverly Ferrarihristopher Dickson MD on 02/02/2021 at 12:50:12 PM.    Final     Anti-infectives: Anti-infectives (From admission, onward)    Start     Dose/Rate Route Frequency Ordered Stop   02/01/21 1700  ceFAZolin (ANCEF) IVPB 1 g/50 mL premix        1 g 100 mL/hr over 30 Minutes Intravenous Every 6 hours 02/01/21 1604 02/02/21 0623   02/01/21 1208  tobramycin (NEBCIN) powder  Status:  Discontinued          As needed 02/01/21 1208 02/01/21  1336   02/01/21 1208  vancomycin (VANCOCIN) powder  Status:  Discontinued          As needed 02/01/21 1208 02/01/21 1336   02/01/21 0800  ceFAZolin (ANCEF) IVPB 2g/100 mL premix        2 g 200 mL/hr over 30 Minutes Intravenous To [facility] 01/30/21 1024 02/01/21 1125   01/27/21 2000  ceFAZolin (ANCEF) IVPB 2g/100 mL premix        2 g 200 mL/hr over 30 Minutes Intravenous Every 8 hours 01/27/21 1827 01/28/21 1231   01/27/21 1700  ceFAZolin (ANCEF) IVPB 2g/100 mL premix  Status:  Discontinued        2 g 200 mL/hr over 30 Minutes Intravenous Every 8 hours 01/27/21 1604 01/27/21 1827   01/27/21 1338  vancomycin (VANCOCIN) powder  Status:  Discontinued          As needed 01/27/21 1338 01/27/21 1508   01/27/21 1143  ceFAZolin (ANCEF) 2-4 GM/100ML-% IVPB       Note to Pharmacy: Janne Napoleon   : cabinet override      01/27/21 1143 01/27/21 2359   01/27/21 0015  ceFAZolin (ANCEF) IVPB 2g/100 mL premix        2 g 200 mL/hr over 30 Minutes Intravenous  Once 01/27/21 0013 01/27/21 0052       Assessment/Plan: Problem List: Patient Active Problem List   Diagnosis Date  Noted   Epidural hematoma (HCC) 01/27/2021   Right knee dislocation, initial encounter 01/27/2021   Closed fracture of left proximal tibia 01/27/2021   Fracture of olecranon process of ulna, right, open type I or II, initial encounter 01/27/2021   Closed displaced fracture of left acromial process 01/27/2021    Awaiting CIR for lower extremity trauma.   2 Days Post-Op    LOS: 7 days   Matt B. Daphine Deutscher, MD, [facility] [phone] to reach the surgeon on call.    02/03/2021 9:41 AM

## 2021-02-03 NOTE — Progress Notes (Signed)
Orthopedic Tech Progress Note Patient Details:  Kenneth Bautista 05/20/1986 [id number] Called order to [facility] Patient ID: Kenneth Bautista, male   DOB: August 03, 1985, 35 y.o.   MRN: [id number]  Kenneth Bautista 02/03/2021, 11:53 AM

## 2021-02-03 NOTE — Progress Notes (Signed)
   ORTHOPAEDIC PROGRESS NOTE  s/p Procedure(s): RECONSTRUCTION ANTERIOR CRUCIATE LIGAMENT (ACL) RECONSTRUCTION POSTERIOR CRUCIATE LIGAMENT (PCL) POSTERIOR LATERAL CORNER REPAIR  SUBJECTIVE: Reports moderate pain about operative site. Friend at bedside. No chest pain. No SOB. No nausea/vomiting. No other complaints.  OBJECTIVE: PE: General: resting comfortably in hospital bed, NAD RUE: incision CDI + Motor in  AIN, PIN, Ulnar distributions. Sensation intact in medial, radial, and ulnar distributions. Well perfused digits.  RLE: dressing CDI, Bledsoe brace in place locked in extension, compartments swollen but compressible, no pain with passive stretch, he is able to flex and extend all toes, limited ROM of right ankle, this was noted before surgery, he endorses distal sensation, warm well perfused foot LLE: dressing CDI, compartments swollen but compressible, +EHL/+FHL. Not able to elicit much ankle DF/PF, this may be secondary to swelling and pain. Pain with passive DF to neutral. Foot warm and well perfused.    Vitals:   02/03/21 0728 02/03/21 1606  BP: (!) 114/48 (!) 117/56  Pulse: 62 (!) 57  Resp: 17 18  Temp: 99.5 F (37.5 C) 98.7 F (37.1 C)  SpO2: 98% 99%    ASSESSMENT: Kenneth Bautista is a 35 y.o. male doing well postoperatively. - s/p ORIF left proximal tibia with Dr. Jena Gauss on 01/27/2021 - s/p ORIF right olecranon fracture with Dr. Jena Gauss on 01/27/2021 - s/p Right allograft ACL reconstruction, allograft PCL reconstruction, allograft posterior lateral corner reconstruction, Peroneal nerve neurolysis, Open reduction total fixation of fibular head, Anterolateral ligament reconstruction, Medial meniscal repair, Partial medial meniscectomy, Microfracture medial tibial plateau far medial, Loose body excision, Removal of hardware on 02/01/2021 with Dr. Everardo Pacific  PLAN: Weightbearing:  - RUE: NWB - RLE: NWB  - LLE: NWB ROM: - RUE: okay for elbow motion as tolerated - RLE: straight  for one week then can begin ROM as tolerated in hinged knee brace - LLE: okay for knee motion as tolerated Insicional and dressing care: Reinforce dressings as needed Orthopedic device(s):  - RUE: sling for comfort - RLE: Bledsoe  Showering: Bed bath for now VTE prophylaxis:  Okay to start Lovenox from orthopedic standpoint once cleared by neurosurgery Pain control:  1. Tylenol 1000 mg q 6 hours scheduled 2. Robaxin 1000 mg q 8 hours PRN 3. Oxycodone 5-15 mg q 4 hours PRN 4. Dilaudid 0.5-1 mg q 4 hours PRN Follow - up plan: Will continue to follow while in hospital and plan for outpatient follow-up 2 weeks after d/c Dispo: Therapies as tolerated, PT/OT currently recommending CIR. CIR following and will re-evaluate Contact information:  Dr. Ramond Marrow, Alfonse Alpers PA-C, After hours and holidays please check [email or web address] for group call information for [facility]   Alfonse Alpers, PA-C 02/03/2021

## 2021-02-04 MED ORDER — NICOTINE 14 MG/24HR TD PT24
14.0000 mg | MEDICATED_PATCH | Freq: Every day | TRANSDERMAL | Status: DC
  Administered 2021-02-04 – 2021-02-12 (×9): 14 mg via TRANSDERMAL
  Filled 2021-02-04 (×9): qty 1

## 2021-02-04 MED ORDER — KCL IN DEXTROSE-NACL 20-5-0.9 MEQ/L-%-% IV SOLN
INTRAVENOUS | Status: DC
  Filled 2021-02-04 (×5): qty 1000
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
## 2021-02-05 ENCOUNTER — Encounter (HOSPITAL_COMMUNITY): Payer: Self-pay | Admitting: Orthopaedic Surgery
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
## 2021-02-05 NOTE — Progress Notes (Signed)
Orthopedic Tech Progress Note Patient Details:  MAHLON GABRIELLE Aug 25, 1985 [id number]  Ortho Devices Type of Ortho Device: Ankle splint Ortho Device/Splint Location: R PRAFO Ortho Device/Splint Interventions: Ordered, Application   Post Interventions Patient Tolerated: Well Instructions Provided: Adjustment of device  R PRAFO applied, pt's L ankle is also tight, may want to consider a L PRAFO vs. Alternating the one every few hours.    Thanks,  Corinna Capra, PT, DPT  Acute Rehabilitation Ortho Tech Supervisor [phone] pager #[phone] office     Lurena Joiner B Abdulaziz Toman 02/05/2021, 7:42 PM
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
## 2021-02-06 MED ORDER — METHOCARBAMOL 750 MG PO TABS
750.0000 mg | ORAL_TABLET | Freq: Three times a day (TID) | ORAL | Status: DC
  Administered 2021-02-06 – 2021-02-12 (×19): 750 mg via ORAL
  Filled 2021-02-06 (×19): qty 1

## 2021-02-06 MED ORDER — HYDROMORPHONE HCL 1 MG/ML IJ SOLN
0.5000 mg | Freq: Four times a day (QID) | INTRAMUSCULAR | Status: DC | PRN
  Administered 2021-02-06 – 2021-02-12 (×13): 0.5 mg via INTRAVENOUS
  Filled 2021-02-06 (×13): qty 0.5

## 2021-02-06 MED ORDER — MAGIC MOUTHWASH
10.0000 mL | Freq: Three times a day (TID) | ORAL | Status: DC
  Administered 2021-02-06 – 2021-02-12 (×16): 10 mL via ORAL
  Filled 2021-02-06 (×20): qty 10

## 2021-02-06 MED ORDER — HYDROXYZINE HCL 25 MG PO TABS
25.0000 mg | ORAL_TABLET | Freq: Three times a day (TID) | ORAL | Status: DC | PRN
  Filled 2021-02-06: qty 1
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
## 2021-02-06 NOTE — Progress Notes (Signed)
   ORTHOPAEDIC PROGRESS NOTE  s/p Procedure(s): RECONSTRUCTION ANTERIOR CRUCIATE LIGAMENT (ACL) RECONSTRUCTION POSTERIOR CRUCIATE LIGAMENT (PCL) POSTERIOR LATERAL CORNER REPAIR  SUBJECTIVE: Reports moderate pain at bilateral lower extremities, right worse than left. Minimal right elbow pain. Right PRAFO was placed yesterday. No chest pain. No SOB. No nausea/vomiting. No other complaints.  OBJECTIVE: PE: General: resting comfortably in hospital bed, NAD RUE: incision CDI + Motor in  AIN, PIN, Ulnar distributions. Sensation intact in medial, radial, and ulnar distributions. Well perfused digits. Able to make a full fist. RLE: dressing CDI, Bledsoe brace in place locked in extension, compartments swollen but compressible. He is able to flex and extend all toes, however with significant flexion of right great toe does cause pain. He is very tender diffusely at right lower leg to light touch. No ability to dorsiflex or plantarflex at right ankle, this was noted prior to surgery. PRAFO in place. endorses distal sensation to all aspects of foot, warm well perfused foot LLE: dressing CDI, compartments swollen but compressible, +EHL/+FHL, Has difficulty with dorsiflexion and plantarflexion on the left as well. No pain with passive dorsiflexion or plantarflexion. Foot warm and well perfusion. Endorse sensation to foot.    Vitals:   02/05/21 2033 02/06/21 0508  BP: 128/75 120/83  Pulse: 83 74  Resp: 17 17  Temp: 98.6 F (37 C) 98 F (36.7 C)  SpO2: 100% 100%    ASSESSMENT: Kenneth Bautista is a 35 y.o. male doing well postoperatively. - s/p ORIF left proximal tibia with Dr. Jena Gauss on 01/27/2021 - s/p ORIF right olecranon fracture with Dr. Jena Gauss on 01/27/2021 - s/p Right allograft ACL reconstruction, allograft PCL reconstruction, allograft posterior lateral corner reconstruction, Peroneal nerve neurolysis, Open reduction total fixation of fibular head, Anterolateral ligament reconstruction, Medial  meniscal repair, Partial medial meniscectomy, Microfracture medial tibial plateau far medial, Loose body excision, Removal of hardware on 02/01/2021 with Dr. Everardo Pacific  PLAN: Weightbearing:  - RUE: NWB - RLE: NWB  - LLE: NWB ROM: - RUE: okay for elbow motion as tolerated - RLE: straight for one week then can begin ROM as tolerated in hinged knee brace - LLE: okay for knee motion as tolerated Insicional and dressing care: Reinforce dressings as needed Orthopedic device(s):  - RUE: sling for comfort - RLE: Bledsoe, PRAFO - will order afo boot for left ankle as well,  Showering: Bed bath for now VTE prophylaxis:  lovenox Pain control:  1. Tylenol 1000 mg q 6 hours scheduled 2. Robaxin 1000 mg q 8 hours PRN 3. Oxycodone 5-15 mg q 4 hours PRN 4. Dilaudid 0.5-1 mg q 4 hours PRN Follow - up plan: Will continue to follow while in hospital and plan for outpatient follow-up 2 weeks after d/c Dispo: Therapies as tolerated, PT/OT currently recommending CIR. CIR following and will re-evaluate  Alfonse Alpers, PA-C 02/06/2021
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
## 2021-02-06 NOTE — Progress Notes (Signed)
RN notified of inability to eat as the patient endorses pain in his mouth. Oral mucosa is pink, no thrush noted; although, a white patch was noted on his left inner lip. Will notify MD.
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
## 2021-02-07 MED ORDER — SALINE SPRAY 0.65 % NA SOLN
1.0000 | NASAL | Status: DC | PRN
  Administered 2021-02-07 – 2021-02-08 (×2): 1 via NASAL
  Filled 2021-02-07: qty 44

## 2021-02-07 NOTE — Progress Notes (Signed)
Orthopedic Tech Progress Note Patient Details:  SHAHAN STARKS [birth year] [id number]  This morning I was called to bring patient a PRAFO BOOT but I have different orders for different products. Called PA no answer and spoke with THERAPIST as well and she stated to wait to get clarification on to what he needs. Notified RN as well.  Patient ID: KAISEI GILBO, male   DOB: 1985-07-28, 35 y.o.   MRN: [id number]  [name] 02/07/2021, 12:34 PM

## 2021-02-07 NOTE — Progress Notes (Signed)
Inpatient Rehab Admissions Coordinator:   Consult received. I met with pt at the bedside with his mother to discuss recommendations for CIR as well as goals/expectations.  I let them know that he would most likely need 24/7 supervision at discharge from CIR, and expected length of stay would be about 3-4 weeks.  We discussed that goals for CIR would not be ambulatory goals given weight bearing restrictions.  I will f/u with pt and his mom tomorrow about dispo planning, as they will need to pull together 24/7 supervision and determine whether plan is to acquire a ramp for the patients apartment or find another apartment with level entry.   Shann Medal, PT, DPT Admissions Coordinator [phone] 02/07/21  3:26 PM
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
## 2021-02-07 NOTE — Progress Notes (Signed)
Orthopedic Tech Progress Note Patient Details:  Kenneth Bautista 1986-05-06 [id number]  Ortho Devices Type of Ortho Device: Prafo boot/shoe Ortho Device/Splint Location: LLE Ortho Device/Splint Interventions: Ordered, Application, Adjustment   Post Interventions Patient Tolerated: Well Instructions Provided: Care of device  [name] 02/07/2021, 4:01 PM
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
## 2021-02-08 NOTE — Progress Notes (Signed)
Inpatient Rehab Admissions Coordinator:   Met with pt and mom at the bedside.  Working to secure a ramp and 24/7 supervision. I will start insurance auth tomorrow AM.   Caitlin Warren, PT, DPT Admissions Coordinator [phone] 02/08/21  3:56 PM  
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
## 2021-02-09 MED ORDER — JUVEN PO PACK
1.0000 | PACK | Freq: Two times a day (BID) | ORAL | Status: DC
  Administered 2021-02-09 – 2021-02-12 (×5): 1 via ORAL
  Filled 2021-02-09 (×6): qty 1
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
## 2021-02-09 NOTE — Progress Notes (Signed)
   ORTHOPAEDIC PROGRESS NOTE  s/p Procedure(s): RECONSTRUCTION ANTERIOR CRUCIATE LIGAMENT (ACL) RECONSTRUCTION POSTERIOR CRUCIATE LIGAMENT (PCL) POSTERIOR LATERAL CORNER REPAIR  SUBJECTIVE: Reports moderate pain at bilateral lower extremities, right worse than left. Minimal right elbow pain. Has been working with therapy. States he did better yesterday. No chest pain. No SOB. No nausea/vomiting. No other complaints.  OBJECTIVE: PE: General: resting comfortably in hospital bed, NAD RUE: incision CDI + Motor in  AIN, PIN, Ulnar distributions. Sensation intact in medial, radial, and ulnar distributions. Well perfused digits. Able to make a full fist. RLE: dressings removed - incisions CDI with steri-strips in place, ex-fix sites with no active drainage, new dressing placed over these, Bledsoe brace in place locked in extension, compartments swollen but compressible. He is able to flex and extend all toes. No ability to dorsiflex or plantarflex at right ankle, this was noted prior to surgery. PRAFO in place. endorses distal sensation to all aspects of foot, warm well perfused foot LLE: incision CDI, compartments swollen but compressible, +EHL/+FHL, Has difficulty with dorsiflexion and plantarflexion on the left as well. No pain with passive dorsiflexion or plantarflexion. PRAFO in place. Foot warm and well perfusion. Endorse sensation to foot.    Vitals:   02/08/21 2019 02/09/21 0442  BP: 119/60 115/72  Pulse: 65 68  Resp: 17 17  Temp: 98.2 F (36.8 C) 98.1 F (36.7 C)  SpO2: 100% 100%    ASSESSMENT: Kenneth Bautista is a 35 y.o. male doing well postoperatively. - s/p ORIF left proximal tibia with Dr. Jena Gauss on 01/27/2021 - s/p ORIF right olecranon fracture with Dr. Jena Gauss on 01/27/2021 - s/p Right allograft ACL reconstruction, allograft PCL reconstruction, allograft posterior lateral corner reconstruction, Peroneal nerve neurolysis, Open reduction total fixation of fibular head,  Anterolateral ligament reconstruction, Medial meniscal repair, Partial medial meniscectomy, Microfracture medial tibial plateau far medial, Loose body excision, Removal of hardware on 02/01/2021 with Dr. Everardo Pacific  PLAN: Weightbearing:  - RUE: NWB - RLE: NWB  - LLE: NWB ROM: - RUE: okay for elbow motion as tolerated - RLE: straight for one week then can begin ROM as tolerated in hinged knee brace  *PT protocol for multi-ligament reconstruction placed in the patient's room, okay to begin ROM of the knee per protocol - LLE: okay for knee motion as tolerated Insicional and dressing care:  - RUE + LLE: okay to leave incisions clean, dry, and open to air - RLE: leave steri-strips in place, reinforce pin sites as needed Orthopedic device(s):  - RUE: sling for comfort - RLE: Bledsoe, PRAFO - LLE: PRAFO Showering: Bed bath for now VTE prophylaxis:  lovenox Pain control:  1. Tylenol 1000 mg q 6 hours scheduled 2. Robaxin 1000 mg q 8 hours PRN 3. Oxycodone 5-15 mg q 4 hours PRN 4. Dilaudid 0.5-1 mg q 4 hours PRN Follow - up plan: Will continue to follow while in hospital and plan for outpatient follow-up 2 weeks after d/c Dispo: Therapies as tolerated, PT/OT currently recommending CIR. CIR following and starting insurance authorization  Alfonse Alpers, [place] 02/09/2021

## 2021-02-09 NOTE — Progress Notes (Signed)
Inpatient Rehab Admissions Coordinator:   Opened insurance for prior authorization.  Awaiting determination.    Estill Dooms, PT, DPT Admissions Coordinator [phone] 02/09/21  9:30 AM
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
## 2021-02-12 ENCOUNTER — Inpatient Hospital Stay (HOSPITAL_COMMUNITY)
Admission: RE | Admit: 2021-02-12 | Discharge: 2021-02-26 | DRG: 945 | Disposition: A | Discharge status: Home / Self Care | Payer: 59 | Source: Intra-hospital | Attending: Physical Medicine & Rehabilitation | Admitting: Physical Medicine & Rehabilitation

## 2021-02-12 ENCOUNTER — Encounter (HOSPITAL_COMMUNITY): Payer: Self-pay | Admitting: Physical Medicine & Rehabilitation

## 2021-02-12 ENCOUNTER — Encounter: Payer: Self-pay | Admitting: General Surgery

## 2021-02-12 ENCOUNTER — Other Ambulatory Visit: Payer: Self-pay

## 2021-02-12 DIAGNOSIS — S069X0A Unspecified intracranial injury without loss of consciousness, initial encounter: Secondary | ICD-10-CM

## 2021-02-12 DIAGNOSIS — S52021E Displaced fracture of olecranon process without intraarticular extension of right ulna, subsequent encounter for open fracture type I or II with routine healing: Secondary | ICD-10-CM | POA: Diagnosis not present

## 2021-02-12 DIAGNOSIS — S82402D Unspecified fracture of shaft of left fibula, subsequent encounter for closed fracture with routine healing: Secondary | ICD-10-CM

## 2021-02-12 DIAGNOSIS — G479 Sleep disorder, unspecified: Secondary | ICD-10-CM

## 2021-02-12 DIAGNOSIS — T07XXXA Unspecified multiple injuries, initial encounter: Secondary | ICD-10-CM

## 2021-02-12 DIAGNOSIS — S42122S Displaced fracture of acromial process, left shoulder, sequela: Secondary | ICD-10-CM

## 2021-02-12 DIAGNOSIS — S83104A Unspecified dislocation of right knee, initial encounter: Secondary | ICD-10-CM | POA: Diagnosis not present

## 2021-02-12 DIAGNOSIS — S82142D Displaced bicondylar fracture of left tibia, subsequent encounter for closed fracture with routine healing: Secondary | ICD-10-CM

## 2021-02-12 DIAGNOSIS — S064X9A Epidural hemorrhage with loss of consciousness of unspecified duration, initial encounter: Secondary | ICD-10-CM

## 2021-02-12 DIAGNOSIS — Z6824 Body mass index (BMI) 24.0-24.9, adult: Secondary | ICD-10-CM

## 2021-02-12 DIAGNOSIS — S069XAA Unspecified intracranial injury with loss of consciousness status unknown, initial encounter: Secondary | ICD-10-CM | POA: Diagnosis present

## 2021-02-12 DIAGNOSIS — E871 Hypo-osmolality and hyponatremia: Secondary | ICD-10-CM | POA: Diagnosis present

## 2021-02-12 DIAGNOSIS — S069X9D Unspecified intracranial injury with loss of consciousness of unspecified duration, subsequent encounter: Secondary | ICD-10-CM | POA: Diagnosis not present

## 2021-02-12 DIAGNOSIS — G8918 Other acute postprocedural pain: Secondary | ICD-10-CM | POA: Diagnosis present

## 2021-02-12 DIAGNOSIS — R7401 Elevation of levels of liver transaminase levels: Secondary | ICD-10-CM | POA: Diagnosis present

## 2021-02-12 DIAGNOSIS — Z716 Tobacco abuse counseling: Secondary | ICD-10-CM

## 2021-02-12 DIAGNOSIS — S066X9D Traumatic subarachnoid hemorrhage with loss of consciousness of unspecified duration, subsequent encounter: Secondary | ICD-10-CM | POA: Diagnosis present

## 2021-02-12 DIAGNOSIS — E441 Mild protein-calorie malnutrition: Secondary | ICD-10-CM | POA: Diagnosis present

## 2021-02-12 DIAGNOSIS — S52021B Displaced fracture of olecranon process without intraarticular extension of right ulna, initial encounter for open fracture type I or II: Secondary | ICD-10-CM | POA: Diagnosis not present

## 2021-02-12 DIAGNOSIS — S022XXD Fracture of nasal bones, subsequent encounter for fracture with routine healing: Secondary | ICD-10-CM | POA: Diagnosis not present

## 2021-02-12 DIAGNOSIS — S069X9A Unspecified intracranial injury with loss of consciousness of unspecified duration, initial encounter: Secondary | ICD-10-CM | POA: Diagnosis present

## 2021-02-12 DIAGNOSIS — D62 Acute posthemorrhagic anemia: Secondary | ICD-10-CM | POA: Diagnosis present

## 2021-02-12 DIAGNOSIS — E8809 Other disorders of plasma-protein metabolism, not elsewhere classified: Secondary | ICD-10-CM | POA: Diagnosis present

## 2021-02-12 DIAGNOSIS — H8111 Benign paroxysmal vertigo, right ear: Secondary | ICD-10-CM | POA: Diagnosis present

## 2021-02-12 DIAGNOSIS — F17210 Nicotine dependence, cigarettes, uncomplicated: Secondary | ICD-10-CM | POA: Diagnosis present

## 2021-02-12 DIAGNOSIS — E46 Unspecified protein-calorie malnutrition: Secondary | ICD-10-CM

## 2021-02-12 DIAGNOSIS — J4541 Moderate persistent asthma with (acute) exacerbation: Secondary | ICD-10-CM

## 2021-02-12 DIAGNOSIS — S82192S Other fracture of upper end of left tibia, sequela: Secondary | ICD-10-CM | POA: Diagnosis not present

## 2021-02-12 DIAGNOSIS — S069X9S Unspecified intracranial injury with loss of consciousness of unspecified duration, sequela: Secondary | ICD-10-CM | POA: Diagnosis not present

## 2021-02-12 DIAGNOSIS — G4700 Insomnia, unspecified: Secondary | ICD-10-CM | POA: Diagnosis present

## 2021-02-12 DIAGNOSIS — T148XXA Other injury of unspecified body region, initial encounter: Secondary | ICD-10-CM

## 2021-02-12 LAB — CBC
HCT: 28 % — ABNORMAL LOW (ref 39.0–52.0)
Hemoglobin: 9.5 g/dL — ABNORMAL LOW (ref 13.0–17.0)
MCH: 30.8 pg (ref 26.0–34.0)
MCHC: 33.9 g/dL (ref 30.0–36.0)
MCV: 90.9 fL (ref 80.0–100.0)
Platelets: 707 10*3/uL — ABNORMAL HIGH (ref 150–400)
RBC: 3.08 MIL/uL — ABNORMAL LOW (ref 4.22–5.81)
RDW: 13.9 % (ref 11.5–15.5)
WBC: 7.5 10*3/uL (ref 4.0–10.5)
nRBC: 0 % (ref 0.0–0.2)

## 2021-02-12 LAB — CREATININE, SERUM
Creatinine, Ser: 0.72 mg/dL (ref 0.61–1.24)
GFR, Estimated: >60 mL/min (ref >60)

## 2021-02-12 MED ORDER — ENOXAPARIN SODIUM 30 MG/0.3ML IJ SOSY: 30.0000 mg | PREFILLED_SYRINGE | Freq: Two times a day (BID) | INTRAMUSCULAR | Status: DC

## 2021-02-12 MED ORDER — JUVEN PO PACK
1.0000 | PACK | Freq: Two times a day (BID) | ORAL | Status: DC
  Administered 2021-02-14 – 2021-02-22 (×9): 1 via ORAL
  Filled 2021-02-12 (×8): qty 1

## 2021-02-12 MED ORDER — ENSURE ENLIVE PO LIQD
237.0000 mL | Freq: Three times a day (TID) | ORAL | Status: DC
  Administered 2021-02-16 – 2021-02-25 (×24): 237 mL via ORAL

## 2021-02-12 MED ORDER — VITAMIN D 25 MCG (1000 UNIT) PO TABS
1000.0000 [IU] | ORAL_TABLET | Freq: Every day | ORAL | Status: DC
  Administered 2021-02-13 – 2021-02-26 (×14): 1000 [IU] via ORAL
  Filled 2021-02-12 (×15): qty 1

## 2021-02-12 MED ORDER — TRAZODONE HCL 50 MG PO TABS
50.0000 mg | ORAL_TABLET | Freq: Every day | ORAL | Status: DC
  Administered 2021-02-12 – 2021-02-25 (×14): 50 mg via ORAL
  Filled 2021-02-12 (×14): qty 1

## 2021-02-12 MED ORDER — ADULT MULTIVITAMIN W/MINERALS CH
1.0000 | ORAL_TABLET | Freq: Every day | ORAL | Status: DC
  Administered 2021-02-13 – 2021-02-26 (×14): 1 via ORAL
  Filled 2021-02-12 (×14): qty 1

## 2021-02-12 MED ORDER — POLYETHYLENE GLYCOL 3350 17 G PO PACK
17.0000 g | PACK | Freq: Every day | ORAL | Status: DC
  Administered 2021-02-13 – 2021-02-26 (×13): 17 g via ORAL
  Filled 2021-02-12 (×14): qty 1

## 2021-02-12 MED ORDER — ONDANSETRON HCL 4 MG/2ML IJ SOLN: 4.0000 mg | Freq: Four times a day (QID) | INTRAMUSCULAR | Status: DC | PRN

## 2021-02-12 MED ORDER — SALINE SPRAY 0.65 % NA SOLN
1.0000 | NASAL | Status: DC | PRN
  Administered 2021-02-18: 1 via NASAL
  Filled 2021-02-12: qty 44

## 2021-02-12 MED ORDER — DOCUSATE SODIUM 100 MG PO CAPS
100.0000 mg | ORAL_CAPSULE | Freq: Two times a day (BID) | ORAL | Status: DC
  Administered 2021-02-12 – 2021-02-25 (×26): 100 mg via ORAL
  Filled 2021-02-12 (×30): qty 1

## 2021-02-12 MED ORDER — NICOTINE 14 MG/24HR TD PT24
14.0000 mg | MEDICATED_PATCH | Freq: Every day | TRANSDERMAL | Status: DC
  Administered 2021-02-13 – 2021-02-26 (×14): 14 mg via TRANSDERMAL
  Filled 2021-02-12 (×14): qty 1

## 2021-02-12 MED ORDER — ONDANSETRON HCL 4 MG PO TABS: 4.0000 mg | ORAL_TABLET | Freq: Four times a day (QID) | ORAL | Status: DC | PRN

## 2021-02-12 MED ORDER — METHOCARBAMOL 750 MG PO TABS
750.0000 mg | ORAL_TABLET | Freq: Three times a day (TID) | ORAL | Status: DC
  Administered 2021-02-12 – 2021-02-26 (×42): 750 mg via ORAL
  Filled 2021-02-12 (×42): qty 1

## 2021-02-12 MED ORDER — OXYCODONE HCL 5 MG PO TABS
5.0000 mg | ORAL_TABLET | ORAL | Status: DC | PRN
  Administered 2021-02-12 – 2021-02-15 (×13): 10 mg via ORAL
  Administered 2021-02-15: 5 mg via ORAL
  Administered 2021-02-15 – 2021-02-18 (×16): 10 mg via ORAL
  Administered 2021-02-19: 5 mg via ORAL
  Administered 2021-02-19 – 2021-02-20 (×5): 10 mg via ORAL
  Administered 2021-02-20: 5 mg via ORAL
  Administered 2021-02-20 (×2): 10 mg via ORAL
  Administered 2021-02-20: 5 mg via ORAL
  Administered 2021-02-20 – 2021-02-25 (×18): 10 mg via ORAL
  Administered 2021-02-25: 5 mg via ORAL
  Administered 2021-02-25 – 2021-02-26 (×3): 10 mg via ORAL
  Filled 2021-02-12 (×64): qty 2

## 2021-02-12 MED ORDER — HYDROXYZINE HCL 25 MG PO TABS
25.0000 mg | ORAL_TABLET | Freq: Three times a day (TID) | ORAL | Status: DC | PRN
  Administered 2021-02-15 – 2021-02-20 (×5): 25 mg via ORAL
  Filled 2021-02-12 (×5): qty 1

## 2021-02-12 MED ORDER — ENOXAPARIN SODIUM 30 MG/0.3ML IJ SOSY
30.0000 mg | PREFILLED_SYRINGE | Freq: Two times a day (BID) | INTRAMUSCULAR | Status: DC
  Administered 2021-02-12 – 2021-02-26 (×28): 30 mg via SUBCUTANEOUS
  Filled 2021-02-12 (×28): qty 0.3
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
## 2021-02-12 NOTE — Progress Notes (Signed)
Inpatient Rehabilitation Medication Review by a Pharmacist  A complete drug regimen review was completed for this patient to identify any potential clinically significant medication issues.  High Risk Drug Classes Is patient taking? Indication by Medication  Antipsychotic Yes Anxiety- hydroxyzine Sleep aid- trazodone  Anticoagulant yes  VTE prophylaxis- Enoxaparin       Antibiotic No   Opioid Yes Pain control- Oxycodone IR  Antiplatelet No   Hypoglycemics/insulin No   Vasoactive Medication No   Chemotherapy No   Other No      Type of Medication Issue Identified Description of Issue Recommendation(s)  Drug Interaction(s) (clinically significant)     Duplicate Therapy     Allergy     No Medication Administration End Date     Incorrect Dose     Additional Drug Therapy Needed     Significant med changes from prior encounter (inform family/care partners about these prior to discharge).    Other  Prior to admit  (home med) not resumed on previous encounter or current encounter.  Protonix 40mg  daily  Albuterol inhaler prn wheezing and shortness of breath.  CIR MD/PA to review on AM rounds to determine if pt needs these medications at this time.    Clinically significant medication issues were identified that warrant physician communication and completion of prescribed/recommended actions by midnight of the next day:  No  Name of provider notified for urgent issues identified: n/a  Provider Method of Notification: n/a  Time spent performing this drug regimen review (minutes):  10   , RPh Clinical Pharmacist 02/12/2021 4:30 PM
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
## 2021-02-12 NOTE — TOC Transition Note (Signed)
Transition of Care [facility]) - CM/SW Discharge Note   Patient Details  Name: Kenneth Bautista MRN: [id number] Date of Birth: 1985/08/11  [facility] CM/SW Contact:  Glennon Mac, RN Phone Number: 02/12/2021, 11:54 AM   Clinical Narrative:   Pt medically stable for discharge, and insurance authorization received for admission to [facility] CIR.  Plan dc to inpatient rehab today upon bed availability.     Final next level of care: IP Rehab Facility Barriers to Discharge: Barriers Resolved   Patient Goals and CMS Choice Patient states their goals for this hospitalization and ongoing recovery are:: to go home CMS Medicare.gov Compare Post Acute Care list provided to:: Patient Choice offered to / list presented to : Patient                        Discharge Plan and Services   Discharge Planning Services: CM Consult Post Acute Care Choice: IP Rehab                               Social Determinants of Health (SDOH) Interventions     Readmission Risk Interventions No flowsheet data found.  Quintella Baton, RN, BSN  Trauma/Neuro ICU Case Manager [phone]
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
## 2021-02-12 NOTE — Progress Notes (Signed)
Pt transferring to CIR, gave report to [address] [place]. IV in place and saline lock, all belonging with patient.

## 2021-02-13 ENCOUNTER — Inpatient Hospital Stay (HOSPITAL_COMMUNITY): Payer: 59

## 2021-02-13 ENCOUNTER — Encounter (HOSPITAL_COMMUNITY): Payer: 59

## 2021-02-13 DIAGNOSIS — S069X9D Unspecified intracranial injury with loss of consciousness of unspecified duration, subsequent encounter: Secondary | ICD-10-CM | POA: Diagnosis not present

## 2021-02-13 DIAGNOSIS — G479 Sleep disorder, unspecified: Secondary | ICD-10-CM

## 2021-02-13 DIAGNOSIS — E871 Hypo-osmolality and hyponatremia: Secondary | ICD-10-CM

## 2021-02-13 DIAGNOSIS — E8809 Other disorders of plasma-protein metabolism, not elsewhere classified: Secondary | ICD-10-CM

## 2021-02-13 DIAGNOSIS — D62 Acute posthemorrhagic anemia: Secondary | ICD-10-CM

## 2021-02-13 DIAGNOSIS — T07XXXA Unspecified multiple injuries, initial encounter: Secondary | ICD-10-CM

## 2021-02-13 DIAGNOSIS — E46 Unspecified protein-calorie malnutrition: Secondary | ICD-10-CM

## 2021-02-13 LAB — COMPREHENSIVE METABOLIC PANEL
ALT: 24 U/L (ref 0–44)
AST: 17 U/L (ref 15–41)
Albumin: 3.2 g/dL — ABNORMAL LOW (ref 3.5–5.0)
Alkaline Phosphatase: 205 U/L — ABNORMAL HIGH (ref 38–126)
Anion gap: 8 (ref 5–15)
BUN: 12 mg/dL (ref 6–20)
CO2: 28 mmol/L (ref 22–32)
Calcium: 9.4 mg/dL (ref 8.9–10.3)
Chloride: 94 mmol/L — ABNORMAL LOW (ref 98–111)
Creatinine, Ser: 0.67 mg/dL (ref 0.61–1.24)
GFR, Estimated: >60 mL/min (ref >60)
Glucose, Bld: 97 mg/dL (ref 70–99)
Potassium: 4.1 mmol/L (ref 3.5–5.1)
Sodium: 130 mmol/L — ABNORMAL LOW (ref 135–145)
Total Bilirubin: 0.9 mg/dL (ref 0.3–1.2)
Total Protein: 6.2 g/dL — ABNORMAL LOW (ref 6.5–8.1)

## 2021-02-13 LAB — CBC WITH DIFFERENTIAL/PLATELET
Abs Immature Granulocytes: 0.02 10*3/uL (ref 0.00–0.07)
Basophils Absolute: 0.1 10*3/uL (ref 0.0–0.1)
Basophils Relative: 1 %
Eosinophils Absolute: 0.1 10*3/uL (ref 0.0–0.5)
Eosinophils Relative: 3 %
HCT: 28.9 % — ABNORMAL LOW (ref 39.0–52.0)
Hemoglobin: 9.4 g/dL — ABNORMAL LOW (ref 13.0–17.0)
Immature Granulocytes: 0 %
Lymphocytes Relative: 18 %
Lymphs Abs: 1 10*3/uL (ref 0.7–4.0)
MCH: 29.7 pg (ref 26.0–34.0)
MCHC: 32.5 g/dL (ref 30.0–36.0)
MCV: 91.2 fL (ref 80.0–100.0)
Monocytes Absolute: 0.7 10*3/uL (ref 0.1–1.0)
Monocytes Relative: 13 %
Neutro Abs: 3.5 10*3/uL (ref 1.7–7.7)
Neutrophils Relative %: 65 %
Platelets: 687 10*3/uL — ABNORMAL HIGH (ref 150–400)
RBC: 3.17 MIL/uL — ABNORMAL LOW (ref 4.22–5.81)
RDW: 13.9 % (ref 11.5–15.5)
WBC: 5.5 10*3/uL (ref 4.0–10.5)
nRBC: 0 % (ref 0.0–0.2)

## 2021-02-13 MED ORDER — PROSOURCE PLUS PO LIQD
30.0000 mL | Freq: Two times a day (BID) | ORAL | Status: DC
  Administered 2021-02-13 – 2021-02-26 (×24): 30 mL via ORAL
  Filled 2021-02-13 (×23): qty 30

## 2021-02-13 NOTE — Progress Notes (Signed)
Inpatient Rehabilitation  Patient information reviewed and entered into eRehab system by Tymir Terral M. Hiliary Osorto, M.A., CCC/SLP, PPS Coordinator.  Information including medical coding, functional ability and quality indicators will be reviewed and updated through discharge.    

## 2021-02-13 NOTE — Progress Notes (Signed)
Physical Therapy TBI Note  Patient Details  Name: Kenneth Bautista MRN: [id number] Date of Birth: 1985-11-27  Today's Date: 02/13/2021 PT Individual Time: 1420-1520 PT Individual Time Calculation (min): 60 min   Short Term Goals: Week 1:  PT Short Term Goal 1 (Week 1): Patient will perform rolling R/L with min A while maintaining all precautions. PT Short Term Goal 2 (Week 1): Patient will perform supine to sit with CGA for safety. PT Short Term Goal 3 (Week 1): Patient will perform A/P transfers with mod A with 1 person assisting.  Skilled Therapeutic Interventions/Progress Updates:  Cognitive remediation/compensation;Discharge planning;DME/adaptive equipment instruction;Functional mobility training;Pain management;Psychosocial support;Splinting/orthotics;Therapeutic Activities;UE/LE Strength taining/ROM;Visual/perceptual remediation/compensation;Balance/vestibular training;Community reintegration;Disease management/prevention;Functional electrical stimulation;Neuromuscular re-education;Patient/family education;Skin care/wound management;Therapeutic Exercise;UE/LE Coordination activities;Wheelchair propulsion/positioning   Pt received supine in bed and agrees to therapy. Reports pain in both legs but does not provide number. Reports having taken pain meds 10 minutes prior to therapy session. PT provides elevation, repositioning, and rest breaks as needed to manage pain. Session focused on gentle mobility and problem solving transfers to/from bed and WC. Pt performs supine to sit slowly with maxA form elevated HOB. PT provides primary management of both legs and moves one at a time toward edge of bed. Legs then positioned on seat of chair placed next to bed to allow for elevation due to pain as well as restriction of R knee needing to remain locked into extension with bledsoe brace. With pt in long sitting perpendicular to side of bed, PT provides maxA to scoot to the L toward WC, and pt provides  minimal WB through L upper extremity with arm close to trunk to assist with hip clearance. Pt takes several small scoots and then larger "pop-over" onto WC. Both leg elevated and leg rests noted to be too short, especially with pt donning PRAFO boots. WC transport to gym for time management. PT assists pt to prop legs on mat table on pillows for comfort while legs rests extended for improved fit. Pt takes tour of rehab and also re-educated on WB precautions and implications of restrictions. Pt taken back to room via WC. Pt transfers back to bed with slideboard and +2 assist, with tech stabilizing slideboard while PT lifts chuck pad to boost pt along slideboard, as pt provides some assistance with L upper extremity. Pt then positioned for comfort with heels floated. All needs within reach and alarm intact.   Therapy Documentation Precautions:  Precautions Precautions: Fall, Other (comment) Precaution Comments: RLE bledsoe brace locked in ext Required Braces or Orthoses: Sling, Other Brace Knee Immobilizer - Right: On at all times Restrictions Weight Bearing Restrictions: Yes RUE Weight Bearing: Non weight bearing LUE Weight Bearing: Partial weight bearing RLE Weight Bearing: Non weight bearing LLE Weight Bearing: Non weight bearing Other Position/Activity Restrictions: LUE non-op acromion fx: minimal weightbearing; will be safest to weightbear/push with arm down at side vs reaching or pulling with left shoulder flexed or abducted. Can do gentle PROM/AROM as tolerated. Agitated Behavior Scale: TBI Observation Details Observation Environment: Pt's Room Start of observation period - Date: 02/13/21 Start of observation period - Time: 1420 End of observation period - Date: 02/13/21 End of observation period - Time: 1520 Agitated Behavior Scale (DO NOT LEAVE BLANKS) Short attention span, easy distractibility, inability to concentrate: Present to a slight degree Impulsive, impatient, low tolerance  for pain or frustration: Present to a slight degree Uncooperative, resistant to care, demanding: Absent Violent and/or threatening violence toward people or property: Absent Explosive and/or unpredictable  anger: Absent Rocking, rubbing, moaning, or other self-stimulating behavior: Absent Pulling at tubes, restraints, etc.: Absent Wandering from treatment areas: Absent Restlessness, pacing, excessive movement: Absent Repetitive behaviors, motor, and/or verbal: Present to a slight degree Rapid, loud, or excessive talking: Absent Sudden changes of mood: Absent Easily initiated or excessive crying and/or laughter: Absent Self-abusiveness, physical and/or verbal: Absent Agitated behavior scale total score: 17    Therapy/Group: Individual Therapy  Beau Fanny, PT, DPT 02/13/2021, 3:35 PM
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
## 2021-02-13 NOTE — Progress Notes (Signed)
   02/13/21 1315  Clinical Encounter Type  Visited With Patient and family together  Visit Type Initial  Referral From Nurse  Consult/Referral To Chaplain   Chaplain responded to page for Advance Directive. Pt's mom, Elnita Maxwell, was at bedside. Chaplain provided AD education and answered Pt and family's questions. Pt stated that he wanted his mom to be his health care agent. Pt's mom is taking the paperwork home to fill out. Pt's mom will let Pt's nurse know when she gets here Thursday so the paperwork can be notarized. Chaplain remains available.  This note was prepared by Chaplain Resident, Tacy Learn, MDiv. Chaplain remains available as needed through the on-call pager: [phone].

## 2021-02-13 NOTE — Progress Notes (Signed)
   02/13/21 1106  Clinical Encounter Type  Visited With Patient not available  Visit Type Initial  Referral From Nurse  Consult/Referral To Chaplain   Chaplain responded to page/consult. Pt was in therapies when chaplain arrived. Chaplain will follow-up later today before 2pm.   This note was prepared by Chaplain Resident, Tacy Learn, MDiv. Chaplain remains available as needed through the on-call pager: [phone].
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
## 2021-02-13 NOTE — Discharge Instructions (Addendum)
Inpatient Rehab Discharge Instructions  [facility] Discharge date and time: No discharge date for patient encounter.   Activities/Precautions/ Functional Status: Activity:  Bledsoe brace at all times.  Nonweightbearing right lower extremity, left lower extremity and nonweightbearing right upper extremity Diet: regular diet Wound Care: Routine skin checks Functional status:  ___ No restrictions     ___ Walk up steps independently ___ 24/7 supervision/assistance   ___ Walk up steps with assistance ___ Intermittent supervision/assistance  __x_ Bathe/dress independently ___ Walk with walker     ___ Bathe/dress with assistance ___ Walk Independently    ___ Shower independently ___ Walk with assistance    ___ Shower with assistance ___ No alcohol     ___ Return to work/school ________   COMMUNITY REFERRALS UPON DISCHARGE:     Medical Equipment/Items Ordered:hospital bed, drop arm bedside commode, rolling walker, 30" slide board, and wheelchair                                                 Agency/Supplier:[facility] [id number]   Special Instructions:  No driving smoking or alcohol  My questions have been answered and I understand these instructions. I will adhere to these goals and the provided educational materials after my discharge from the hospital.  Patient/Caregiver Signature _______________________________ Date __________  Clinician Signature _______________________________________ Date __________  Please bring this form and your medication list with you to all your follow-up doctor's appointments.

## 2021-02-13 NOTE — Plan of Care (Signed)
  Problem: RH Balance Goal: LTG Patient will maintain dynamic standing with ADLs (OT) Description: LTG:  Patient will maintain dynamic standing balance with assist during activities of daily living (OT)  Outcome: Not Applicable Flowsheets (Taken 02/13/2021 2228) LTG: Pt will maintain dynamic standing balance during ADLs with: (dc goal 9/13-error) -- Note: dc goal 9/13-error   Problem: RH Bathing Goal: LTG Patient will bathe all body parts with assist levels (OT) Description: LTG: Patient will bathe all body parts with assist levels (OT) Flowsheets (Taken 02/13/2021 2230) LTG: Pt will perform bathing with assistance level/cueing: Minimal Assistance - Patient > 75%   Problem: RH Dressing Goal: LTG Patient will perform upper body dressing (OT) Description: LTG Patient will perform upper body dressing with assist, with/without cues (OT). Flowsheets (Taken 02/13/2021 2230) LTG: Pt will perform upper body dressing with assistance level of: Set up assist Goal: LTG Patient will perform lower body dressing w/assist (OT) Description: LTG: Patient will perform lower body dressing with assist, with/without cues in positioning using equipment (OT) Flowsheets (Taken 02/13/2021 2230) LTG: Pt will perform lower body dressing with assistance level of: Minimal Assistance - Patient > 75%   Problem: RH Toileting Goal: LTG Patient will perform toileting task (3/3 steps) with assistance level (OT) Description: LTG: Patient will perform toileting task (3/3 steps) with assistance level (OT)  Flowsheets (Taken 02/13/2021 2230) LTG: Pt will perform toileting task (3/3 steps) with assistance level: Minimal Assistance - Patient > 75%   Problem: RH Balance Goal: LTG: Patient will maintain dynamic sitting balance (OT) Description: LTG:  Patient will maintain dynamic sitting balance with assistance during activities of daily living (OT) Flowsheets (Taken 02/13/2021 2230) LTG: Pt will maintain dynamic sitting balance  during ADLs with: Supervision/Verbal cueing   Problem: RH Grooming Goal: LTG Patient will perform grooming w/assist,cues/equip (OT) Description: LTG: Patient will perform grooming with assist, with/without cues using equipment (OT) Flowsheets (Taken 02/13/2021 2230) LTG: Pt will perform grooming with assistance level of: Independent   Problem: RH Functional Use of Upper Extremity Goal: LTG Patient will use RT/LT upper extremity as a (OT) Description: LTG: Patient will use right/left upper extremity as a stabilizer/gross assist/diminished/nondominant/dominant level with assist, with/without cues during functional activity (OT) Flowsheets (Taken 02/13/2021 2230) LTG: Use of upper extremity in functional activities: RUE as diminished level   Problem: RH Toilet Transfers Goal: LTG Patient will perform toilet transfers w/assist (OT) Description: LTG: Patient will perform toilet transfers with assist, with/without cues using equipment (OT) Flowsheets (Taken 02/13/2021 2230) LTG: Pt will perform toilet transfers with assistance level of: Minimal Assistance - Patient > 75%
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
## 2021-02-13 NOTE — Plan of Care (Signed)
  Problem: RH Balance Goal: LTG Patient will maintain dynamic standing with ADLs (OT) Description: LTG:  Patient will maintain dynamic standing balance with assist during activities of daily living (OT)  Outcome: Not Applicable Flowsheets (Taken 02/13/2021 2228) LTG: Pt will maintain dynamic standing balance during ADLs with: (dc goal 9/13-error) -- Note: dc goal 9/13-error
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
## 2021-02-14 DIAGNOSIS — S069X9S Unspecified intracranial injury with loss of consciousness of unspecified duration, sequela: Secondary | ICD-10-CM

## 2021-02-14 NOTE — Progress Notes (Signed)
Physical Therapy TBI Note  Patient Details  Name: Kenneth Bautista MRN: [id number] Date of Birth: June 27, 1985  Today's Date: 02/14/2021 PT Individual Time: 1000-1059 PT Individual Time Calculation (min): 59 min   Short Term Goals: Week 1:  PT Short Term Goal 1 (Week 1): Patient will perform rolling R/L with min A while maintaining all precautions. PT Short Term Goal 2 (Week 1): Patient will perform supine to sit with CGA for safety. PT Short Term Goal 3 (Week 1): Patient will perform A/P transfers with mod A with 1 person assisting.  Skilled Therapeutic Interventions/Progress Updates:    Patient received sitting up in wc, agreeable to PT. He denies pain and reports having just received pain rx. PT transporting patient in wc to therapy gym. Reviewed weightbearing status with patient and discussed importance of completing AROM to tolerance to minimize deficits experienced (contractures, atrophy). Patient able to tolerate L knee flexion through ~50% ROM and R knee flexion in Bledsoe brace AROM flexion through ~10% range. Patient reporting up to 8/10 pain when flexing R knee, but quickly subsided when returned to full extension and rest on leg rest. Patient reporting double vision- sometimes horizontal, sometimes vertical, but he was unable to specify when this would occur (quick eye movements, distance, near, etc). Patient completed visual stabilization exercises using playing cards and reaching outside BOS while seated to place matching cards onto board. Patient with noted double vision 10/10 times when looking left to place cards onto board with L UE. PT providing gentle passive stretch to B heel cords due to development of B gastroc shortening. Patient was in PRAFOs, however- heels had begun to slide out. PT emphasizing to patient that when that starts to occur, to let RN/tech know to assist patient with adjusting PRAFOs. Patient verbalized understanding. PT reviewing pain management techniques with  patient including visualization, pursed lip breathing. Patient returning to room in wc, no seatbelt/chair alarm present- PT alerted RN- RN present in room at time of PT leaving.   Therapy Documentation Precautions:  Precautions Precautions: Fall, Other (comment) Precaution Comments: RLE bledsoe brace locked in ext Required Braces or Orthoses: Sling, Other Brace Knee Immobilizer - Right: On at all times Restrictions Weight Bearing Restrictions: Yes RUE Weight Bearing: Non weight bearing LUE Weight Bearing: Partial weight bearing RLE Weight Bearing: Non weight bearing LLE Weight Bearing: Non weight bearing Other Position/Activity Restrictions: LUE non-op acromion fx: minimal weightbearing; will be safest to weightbear/push with arm down at side vs reaching or pulling with left shoulder flexed or abducted. Can do gentle PROM/AROM as tolerated.  Agitated Behavior Scale: TBI Observation Details Observation Environment: patients room Start of observation period - Date: 02/14/21 Start of observation period - Time: 1000 End of observation period - Date: 02/14/21 End of observation period - Time: 1100 Agitated Behavior Scale (DO NOT LEAVE BLANKS) Short attention span, easy distractibility, inability to concentrate: Present to a slight degree Impulsive, impatient, low tolerance for pain or frustration: Present to a slight degree Uncooperative, resistant to care, demanding: Absent Violent and/or threatening violence toward people or property: Absent Explosive and/or unpredictable anger: Absent Rocking, rubbing, moaning, or other self-stimulating behavior: Absent Pulling at tubes, restraints, etc.: Absent Wandering from treatment areas: Absent Restlessness, pacing, excessive movement: Absent Repetitive behaviors, motor, and/or verbal: Present to a slight degree Rapid, loud, or excessive talking: Present to a slight degree Sudden changes of mood: Absent Easily initiated or excessive crying  and/or laughter: Absent Self-abusiveness, physical and/or verbal: Absent Agitated behavior scale total score:  18     Therapy/Group: Individual Therapy  Elizebeth Koller, PT, DPT, CBIS  02/14/2021, 11:02 AM

## 2021-02-14 NOTE — Progress Notes (Signed)
Speech Language Pathology TBI Note  Patient Details  Name: Kenneth Bautista MRN: [id number] Date of Birth: 1985-11-14  Today's Date: 02/14/2021 SLP Individual Time: 1300-1330 SLP Individual Time Calculation (min): 30 min  Short Term Goals: Week 1: SLP Short Term Goal 1 (Week 1): Patient will utilize memory compensatory strategies to recall functional information with Min A multimodal cues. SLP Short Term Goal 2 (Week 1): Patient will demonstrate sustained attention to a functional task for 30 minutes with superivsion level verbal cues for redirection. SLP Short Term Goal 3 (Week 1): Patient will demonstrate functional problem solving for mildly complex tasks with Min verbal cues. SLP Short Term Goal 4 (Week 1): Patient will adhere to weightbearing precautions within a session by requesting assistance for repositioning, etc in 75% of opportunities with Min verbal cues.  Skilled Therapeutic Interventions: Pt seen for skilled ST with focus on cognitive goals. Pt initially in wheelchair but requesting to move to bed d/t pain, benefiting from SPV cues to sequencing transfer with assistance from NT and RN. Pt endorses ongoing memory deficits and emotional lability s/p TBI, provided education on injuries and recovery expectations. Pt states he has increased recall of important dates, information, etc however it "takes a minute". SLP providing memory notebook as a compensatory aid to recall important events, appointments and dates. SLP encouraging pt to utilize L hand to write/contribute to notebook due to RUE precautions and limitations. Pt left in bed with alarm set and all needs within reach. Cont ST POC.    Pain Pain Assessment Pain Scale: 0-10 Pain Score: 5  Pain Location: Leg  Agitated Behavior Scale: TBI Observation Details Observation Environment: pt room Start of observation period - Date: 02/14/21 Start of observation period - Time: 1300 End of observation period - Date: 02/14/21 End of  observation period - Time: 1330 Agitated Behavior Scale (DO NOT LEAVE BLANKS) Short attention span, easy distractibility, inability to concentrate: Present to a slight degree Impulsive, impatient, low tolerance for pain or frustration: Present to a slight degree Uncooperative, resistant to care, demanding: Absent Violent and/or threatening violence toward people or property: Absent Explosive and/or unpredictable anger: Absent Rocking, rubbing, moaning, or other self-stimulating behavior: Absent Pulling at tubes, restraints, etc.: Absent Wandering from treatment areas: Absent Restlessness, pacing, excessive movement: Absent Repetitive behaviors, motor, and/or verbal: Present to a slight degree Rapid, loud, or excessive talking: Present to a slight degree Sudden changes of mood: Absent Easily initiated or excessive crying and/or laughter: Absent Self-abusiveness, physical and/or verbal: Absent Agitated behavior scale total score: 18  Therapy/Group: Individual Therapy  Tacey Ruiz 02/14/2021, 1:50 PM
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
## 2021-02-14 NOTE — Progress Notes (Signed)
Patient ID: Kenneth Bautista, male   DOB: 28-Jun-1985, 35 y.o.   MRN: [id number]  SW spoke with pt mother Elnita Maxwell [phone]) to introduce self, explain role, and discuss discharge process. She reports that she and his sister are working on making sure he has what he needs. She confirms the friend French Ana is coming down from [place], but will not be here until after 10/1; and needs to get a ramp for him. SW informed will share concerns to therapy as current ELOS is 2-2.5 weeks. She was under the impression that he would be here for almost 30 days. SW explained 2.5 weeks is after 10/1 which give enough time for the friend to get here. SW will follow-up with update after team conference.    1538- SW spoke with pt PO- Officer [place] [phone]) to inform on him being here. Reported that she received updates from pt, pt mother, and also spoke to someone with Patient Relations as well. SW informed can provide a letter to support he is here that will include anticipated d/c date.once a date is established.  She requested letter to be emailed: [email or web address]. She intends to visit him on Tuesday between 12pm-1pm.  Cecile Sheerer, MSW, LCSWA Office: [phone] Cell: [phone] Fax: [phone]
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
## 2021-02-14 NOTE — Progress Notes (Addendum)
Occupational Therapy Session Note  Patient Details  Name: Kenneth Bautista MRN: [id number] Date of Birth: 01-Oct-1985  Today's Date: 02/14/2021 OT Individual Time: 0904-1000 OT Individual Time Calculation (min): 56 min    Short Term Goals: Week 1:  OT Short Term Goal 1 (Week 1): Pt will complete UB bathing with setup OT Short Term Goal 2 (Week 1): Pt will complete transfer to wc/BSC with mod A +2 OT Short Term Goal 3 (Week 1): Pt will direct care for B LE management during transfer OT Short Term Goal 4 (Week 1): Pt will complete UB dressing with (S)  Skilled Therapeutic Interventions/Progress Updates:     Pt received in bed with unrated pain in BLE. Offered repositioning after ADL in w/c with improved pain relief  ADL: Overall pt completes BADL at bed level, rolling with MIN A for A for peri care. Pt requires frequent cuing to maintain RUE NWB precautions as well as ROM restrictions at shoulder. Pt per ortho PA WBAT on LUE therefore able to complete majority of bathing UB/peri with LUE. Pt requires A to wash BLE and part of buttocks. Pt requires +2 A to manage threading RLE first and keep RLE in extension and clip brace over LE after pants on with VC for pt waiting to advance past hips till after brace clipped back together. Pt requires MIN A to pull pants past hips and +2 A to scoot to w/c using SB as bridge for AP transfer with VC for anterior weight shift. UB BADL at sink level with VC for decreased use of RUE d/t R shoulder ROM restrictions   Therapeutic activity At the beginning of session pt very tearful reporting increased crying but "doesn't know why its happening." Pt educated on emotional lability is a common experience in TBI recovery. Therapeutic use of self utilized to build rapport and validate feelings of sadness around loss of function. Pt able to verbalize decreased memory as cognitive deficit after TBI.  OT applies towels to elevating leg rests with coban and gait belts  around BLE to facilitate improved neutral positioning (gravity pulling Les into internal rotation as welll as off LE rests). Pt reporting liking set up w/c improvement in pain relief.  Pt left at end of session in w/c direct handoff to PT.   Therapy Documentation Precautions:  Precautions Precautions: Fall, Other (comment) Precaution Comments: RLE bledsoe brace locked in ext Required Braces or Orthoses: Sling, Other Brace Knee Immobilizer - Right: On at all times Restrictions Weight Bearing Restrictions: Yes RUE Weight Bearing: Non weight bearing LUE Weight Bearing: Partial weight bearing RLE Weight Bearing: Non weight bearing LLE Weight Bearing: Non weight bearing Other Position/Activity Restrictions: LUE non-op acromion fx: minimal weightbearing; will be safest to weightbear/push with arm down at side vs reaching or pulling with left shoulder flexed or abducted. Can do gentle PROM/AROM as tolerated.  Therapy/Group: Individual Therapy  Shon Hale 02/14/2021, 6:53 AM

## 2021-02-14 NOTE — Care Management (Signed)
Inpatient Rehabilitation Center Individual Statement of Services  Patient Name:  Kenneth Bautista  Date:  02/14/2021  Welcome to the Inpatient Rehabilitation Center.  Our goal is to provide you with an individualized program based on your diagnosis and situation, designed to meet your specific needs.  With this comprehensive rehabilitation program, you will be expected to participate in at least 3 hours of rehabilitation therapies Monday-Friday, with modified therapy programming on the weekends.  Your rehabilitation program will include the following services:  Physical Therapy (PT), Occupational Therapy (OT), Speech Therapy (ST), 24 hour per day rehabilitation nursing, Therapeutic Recreaction (TR), Psychology, Neuropsychology, Care Coordinator, Rehabilitation Medicine, Nutrition Services, Pharmacy Services, and Other  Weekly team conferences will be held on Tuesdays to discuss your progress.  Your Inpatient Rehabilitation Care Coordinator will talk with you frequently to get your input and to update you on team discussions.  Team conferences with you and your family in attendance may also be held.  Expected length of stay: 2-2.5 weeks  Overall anticipated outcome: Minimal Assistance  Depending on your progress and recovery, your program may change. Your Inpatient Rehabilitation Care Coordinator will coordinate services and will keep you informed of any changes. Your Inpatient Rehabilitation Care Coordinator's name and contact numbers are listed  below.  The following services may also be recommended but are not provided by the Inpatient Rehabilitation Center:  Driving Evaluations Home Health Rehabiltiation Services Outpatient Rehabilitation Services Vocational Rehabilitation   Arrangements will be made to provide these services after discharge if needed.  Arrangements include referral to agencies that provide these services.  Your insurance has been verified to be:  [employer]  Your  primary doctor is:  Marcy Siren  Pertinent information will be shared with your doctor and your insurance company.  Inpatient Rehabilitation Care Coordinator:  Susie Cassette [phone] or ([phone]  Information discussed with and copy given to patient by: Gretchen Short, 02/14/2021, 9:31 AM

## 2021-02-15 NOTE — Progress Notes (Signed)
Physical Therapy TBI Note  Patient Details  Name: Kenneth Bautista MRN: [id number] Date of Birth: Jun 05, 1985  Today's Date: 02/15/2021 PT Individual Time: 1115-1208 PT Individual Time Calculation (min): 53 min   Short Term Goals: Week 1:  PT Short Term Goal 1 (Week 1): Patient will perform rolling R/L with min A while maintaining all precautions. PT Short Term Goal 2 (Week 1): Patient will perform supine to sit with CGA for safety. PT Short Term Goal 3 (Week 1): Patient will perform A/P transfers with mod A with 1 person assisting.  Skilled Therapeutic Interventions/Progress Updates:     Pt received supine in bed and agrees to therapy. Reports pain in both legs but worse in R leg. Number not provided. PT provides repositioning and rest breaks to manage pain. Pt performs AP transfer from bed to [facility] with slideboard and modA +1, with PT assisting to manage bilateral lower extremities and providing frequent reminders of NWB precautions of R upper extremity due to pt not adhering to precautions during transfer. WC transport to gym for time management. Slideboard transfer from [facility] ot mat with modA and cues for body mechanics and placement of legs for safety and comfort. In long sitting, pt performs x10 quad sets with tactile cues for optimal performance, x10 L lower extremity heel slides with cues for correct performance. Pt then practices anterior and posterior scooting with lateral weight shifting to unload contralateral hip. PT provides assistance to hold R lower extremity as well as preventing pt from pushing into mat with L leg. Pt then performs AP transfer to [facility] with same assistance. Slideboard transfer to the R to bed with maxA. Left supine with alarm intact and all needs within reach.  Therapy Documentation Precautions:  Precautions Precautions: Fall, Other (comment) Precaution Comments: RLE bledsoe brace locked in ext Required Braces or Orthoses: Sling, Other Brace Knee Immobilizer - Right: On  at all times Restrictions Weight Bearing Restrictions: Yes RUE Weight Bearing: Non weight bearing LUE Weight Bearing: Partial weight bearing RLE Weight Bearing: Non weight bearing LLE Weight Bearing: Non weight bearing Other Position/Activity Restrictions: LUE non-op acromion fx: minimal weightbearing; will be safest to weightbear/push with arm down at side vs reaching or pulling with left shoulder flexed or abducted. Can do gentle PROM/AROM as tolerated.  Agitated Behavior Scale: TBI Observation Details Observation Environment: Pt's room Start of observation period - Date: 02/15/21 Start of observation period - Time: 1100 End of observation period - Date: 02/15/21 End of observation period - Time: 1200 Agitated Behavior Scale (DO NOT LEAVE BLANKS) Short attention span, easy distractibility, inability to concentrate: Present to a slight degree Impulsive, impatient, low tolerance for pain or frustration: Present to a slight degree Uncooperative, resistant to care, demanding: Absent Violent and/or threatening violence toward people or property: Absent Explosive and/or unpredictable anger: Absent Rocking, rubbing, moaning, or other self-stimulating behavior: Absent Pulling at tubes, restraints, etc.: Absent Wandering from treatment areas: Absent Restlessness, pacing, excessive movement: Absent Repetitive behaviors, motor, and/or verbal: Present to a slight degree Rapid, loud, or excessive talking: Present to a slight degree Sudden changes of mood: Absent Easily initiated or excessive crying and/or laughter: Absent Self-abusiveness, physical and/or verbal: Absent Agitated behavior scale total score: 18  Therapy/Group: Individual Therapy  Beau Fanny, PT, DPT 02/15/2021, 12:35 PM

## 2021-02-15 NOTE — Progress Notes (Signed)
Occupational Therapy TBI Note  Patient Details  Name: Kenneth Bautista MRN: [id number] Date of Birth: Feb 19, 1986  Today's Date: 02/15/2021 OT Individual Time: 0700-0757 OT Individual Time Calculation (min): 57 min    Short Term Goals: Week 1:  OT Short Term Goal 1 (Week 1): Pt will complete UB bathing with setup OT Short Term Goal 2 (Week 1): Pt will complete transfer to wc/BSC with mod A +2 OT Short Term Goal 3 (Week 1): Pt will direct care for B LE management during transfer OT Short Term Goal 4 (Week 1): Pt will complete UB dressing with (S)  Skilled Therapeutic Interventions/Progress Updates:     Pt received in bed with unrated BLE pain.  ADL: SB A/P transfer with MIN A +2 this date with improved push through LUE and anterior weight shift decreasing A to scoot backwards Pt completes bathing with set up for UB once in w/c. Pt completes UB dressing with set up. Reached out to ortho PA who cleared pt for unrestricted shoulder movement-however continue NWB. Pt able to wash and dress UB with set up   Therapeutic activity Pt taken to outside courtyard and practices retrieving bean bags off floor with reaching crossing midline with LUE to retrieve bean bags  SB transfer with min-MOD A Of 1 with Les elevated on OT lap seated on EOM and pt able to scoot across board to mat laterally. VC for reciprocal scooting towards EOC to improve positioning on board.  Pt left at end of session in bed with exit alarm on, call light in reach and all needs met   Therapy Documentation Precautions:  Precautions Precautions: Fall, Other (comment) Precaution Comments: RLE bledsoe brace locked in ext Required Braces or Orthoses: Sling, Other Brace Knee Immobilizer - Right: On at all times Restrictions Weight Bearing Restrictions: Yes RUE Weight Bearing: Non weight bearing LUE Weight Bearing: Partial weight bearing RLE Weight Bearing: Non weight bearing LLE Weight Bearing: Non weight bearing Other  Position/Activity Restrictions: LUE non-op acromion fx: minimal weightbearing; will be safest to weightbear/push with arm down at side vs reaching or pulling with left shoulder flexed or abducted. Can do gentle PROM/AROM as tolerated. Agitated Behavior Scale: TBI  Observation Details Observation Environment: Pt room Start of observation period - Date: 02/15/21 Start of observation period - Time: 0700 End of observation period - Date: 02/15/21 End of observation period - Time: 0800 Agitated Behavior Scale (DO NOT LEAVE BLANKS) Short attention span, easy distractibility, inability to concentrate: Present to a slight degree Impulsive, impatient, low tolerance for pain or frustration: Present to a slight degree Uncooperative, resistant to care, demanding: Absent Violent and/or threatening violence toward people or property: Absent Explosive and/or unpredictable anger: Absent Rocking, rubbing, moaning, or other self-stimulating behavior: Absent Pulling at tubes, restraints, etc.: Absent Wandering from treatment areas: Absent Restlessness, pacing, excessive movement: Absent Repetitive behaviors, motor, and/or verbal: Present to a slight degree Rapid, loud, or excessive talking: Present to a slight degree Sudden changes of mood: Absent Easily initiated or excessive crying and/or laughter: Absent Self-abusiveness, physical and/or verbal: Absent Agitated behavior scale total score: 18    Therapy/Group: Individual Therapy  Tonny Branch 02/15/2021, 12:41 PM

## 2021-02-15 NOTE — Progress Notes (Signed)
Occupational Therapy Session Note  Patient Details  Name: Kenneth Bautista MRN: [id number] Date of Birth: [birth year]  Today's Date: 02/15/2021 OT Individual Time: 1430-1500 OT Individual Time Calculation (min): 30 min   Short Term Goals: Week 1:  OT Short Term Goal 1 (Week 1): Pt will complete UB bathing with setup OT Short Term Goal 2 (Week 1): Pt will complete transfer to wc/BSC with mod A +2 OT Short Term Goal 3 (Week 1): Pt will direct care for B LE management during transfer OT Short Term Goal 4 (Week 1): Pt will complete UB dressing with (S)  Skilled Therapeutic Interventions/Progress Updates:    Patient greeted semi-reclined in bed with mother present. Pt declined to get OOB but agreeable to bed level there-ex. Pt brought into supine and completed UB ROM from bed. LB there-ex straight leg raises, glute squeezes, ankle flex/ext. Sit-ups in bed with reported dizziness with repetition. Pt also reported dizziness with head turns but not as severe. Pt left semi-reclined in bed with bed alarm on, call bell in reach, and needs met.   Therapy Documentation Precautions:  Precautions Precautions: Fall, Other (comment) Precaution Comments: RLE bledsoe brace locked in ext Required Braces or Orthoses: Sling, Other Brace Knee Immobilizer - Right: On at all times Restrictions Weight Bearing Restrictions: Yes RUE Weight Bearing: Non weight bearing LUE Weight Bearing: Partial weight bearing RLE Weight Bearing: Non weight bearing LLE Weight Bearing: Non weight bearing Other Position/Activity Restrictions: LUE non-op acromion fx: minimal weightbearing; will be safest to weightbear/push with arm down at side vs reaching or pulling with left shoulder flexed or abducted. Can do gentle PROM/AROM as tolerated. Pain: Pain Assessment Pain Scale: 0-10 Pain Score: 6  Pain Type: Acute pain Pain Location: Leg Pain Orientation: Right Pain Descriptors / Indicators: Aching Pain Frequency: Constant Pain  Onset: On-going Pain Intervention(s): Repositioned   Therapy/Group: Individual Therapy  Valma Cava 02/15/2021, 3:34 PM
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
## 2021-02-15 NOTE — Progress Notes (Signed)
Speech Language Pathology TBI Note  Patient Details  Name: Kenneth Bautista MRN: [id number] Date of Birth: 09-03-1985  Today's Date: 02/15/2021 SLP Individual Time: 0900-0940 SLP Individual Time Calculation (min): 40 min  Short Term Goals: Week 1: SLP Short Term Goal 1 (Week 1): Patient will utilize memory compensatory strategies to recall functional information with Min A multimodal cues. SLP Short Term Goal 2 (Week 1): Patient will demonstrate sustained attention to a functional task for 30 minutes with superivsion level verbal cues for redirection. SLP Short Term Goal 3 (Week 1): Patient will demonstrate functional problem solving for mildly complex tasks with Min verbal cues. SLP Short Term Goal 4 (Week 1): Patient will adhere to weightbearing precautions within a session by requesting assistance for repositioning, etc in 75% of opportunities with Min verbal cues.  Skilled Therapeutic Interventions: Skilled treatment session focused on cognitive goals. SLP facilitated session by providing extra time and overall Min A verbal cues for working memory during a basic money management task. Patient was overall Mod I for problem solving. Patient also completed a basic medication management task with Mod I. Patient left upright in bed with alarm on and all needs within reach. Continue with current plan of care.      Pain Pain Assessment Pain Scale: 0-10 Pain Score: 10-Worst pain ever Pain Type: Acute pain Pain Location: Leg Pain Orientation: Right Pain Descriptors / Indicators: Aching Pain Frequency: Constant Pain Onset: On-going Pain Intervention(s): Medication (See eMAR)  Agitated Behavior Scale: TBI Observation Details Observation Environment: Patient's room Start of observation period - Date: 02/15/21 Start of observation period - Time: 0900 End of observation period - Date: 02/15/21 End of observation period - Time: 0940 Agitated Behavior Scale (DO NOT LEAVE BLANKS) Short  attention span, easy distractibility, inability to concentrate: Present to a slight degree Impulsive, impatient, low tolerance for pain or frustration: Present to a slight degree Uncooperative, resistant to care, demanding: Absent Violent and/or threatening violence toward people or property: Absent Explosive and/or unpredictable anger: Absent Rocking, rubbing, moaning, or other self-stimulating behavior: Absent Pulling at tubes, restraints, etc.: Absent Wandering from treatment areas: Absent Restlessness, pacing, excessive movement: Absent Repetitive behaviors, motor, and/or verbal: Absent Rapid, loud, or excessive talking: Absent Sudden changes of mood: Absent Easily initiated or excessive crying and/or laughter: Absent Self-abusiveness, physical and/or verbal: Absent Agitated behavior scale total score: 16  Therapy/Group: Individual Therapy  Jayzen Paver 02/15/2021, 2:01 PM

## 2021-02-15 NOTE — Plan of Care (Signed)
Behavioral Plan   Rancho Level: Rancho VII  Behavior to decrease/ eliminate: adherence ot WB precautions, impulsitivity- mild, execute safety plan   Changes to environment:  Bedside table on L side of bed BSC brought to EOB for toileting  ROM protocol for RLE above HOB- BLE NWB RUE-NWB but free ROM LUE- WBAT  Interventions: Bed alarm Seat belt alarm Reminders for WB precautions during transfers-may need sling on RUE to adhere to NWB status Scheduled pain medications  Recommendations for interactions with patient: Allow patient extra time to direct care for LB positioning with pillows etc  Attendees:  Courtney P. SLP Zannie Kehr OT

## 2021-02-15 NOTE — Progress Notes (Signed)
   02/15/21 1045  Clinical Encounter Type  Visited With Patient and family together  Visit Type Follow-up  Referral From Nurse  Consult/Referral To Chaplain   Chaplain responded. The patient's mother and a friend were at the bedside. Chaplain reviewed AD, and it is ready for a notary. The patient appeared to be in good spirits. Chaplain engaged in active listening as he reflected on his life. Prayer offered. Chaplain spoke with [facility] and scheduled a 1:30 pm appointment to notarize. This note was prepared by Deneen Harts, M.Div..  For questions please contact by phone [phone].

## 2021-02-15 NOTE — Progress Notes (Signed)
   02/15/21 1330  Clinical Encounter Type  Visited With Patient and family together  Visit Type Follow-up  Referral From Nurse  Consult/Referral To Chaplain   Chaplain followed up - The notary and two volunteers accompanied Chaplain to witness the patient signing his Advance Directive. The patient's mother was also at his bedside. Chaplain gave her four copies per her request, and the Chaplain left a copy with the unit secretary to place in the patient's file. This note was prepared by Deneen Harts, M.Div..  For questions please contact by phone [phone].

## 2021-02-15 NOTE — Progress Notes (Signed)
RN was called to room patient claims he was having some pain on his chest described as shooting pain all over  and having some dizziness as well.  Patient was anxious. RN talked to patient . Patient not in any form of distress. PRN med for anxiety given.
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
## 2021-02-16 MED ORDER — CAMPHOR-MENTHOL 0.5-0.5 % EX LOTN
TOPICAL_LOTION | CUTANEOUS | Status: DC | PRN
  Filled 2021-02-16: qty 222

## 2021-02-16 NOTE — Progress Notes (Signed)
Occupational Therapy TBI Note  Patient Details  Name: Kenneth Bautista MRN: [id number] Date of Birth: [birth year]  Today's Date: 02/16/2021 OT Individual Time: 1045-1200 OT Individual Time Calculation (min): 75 min    Short Term Goals: Week 1:  OT Short Term Goal 1 (Week 1): Pt will complete UB bathing with setup OT Short Term Goal 2 (Week 1): Pt will complete transfer to wc/BSC with mod A +2 OT Short Term Goal 3 (Week 1): Pt will direct care for B LE management during transfer OT Short Term Goal 4 (Week 1): Pt will complete UB dressing with (S)  Skilled Therapeutic Interventions/Progress Updates:    Pt received supine in bed, c/o unrated pain in legs and agreeable to OT session focusing on transfers, UB/LB bathing and clothing management. Overall, pt did well and demonstrated progress in therapy today with improved ability to perform AP transfers with min A for helper managing wc and leg positioning. Pt requires frequent cuing to maintain RUE NWB precautions. Improved push, anterior weight shift and forward scooting noted for transfers. Pt completes bathing/dressing for setup for UB once in wc. Pt worked on AP transfers on uneven mat surface with use of leg lifter to facilitate self-management of BLE. Able to successfully complete AP transfer wc>mat with MIN A for verbal cues to manage AE + maintain precautions NWB R UE. Received skilled education on use of AE for clothing management, and return demonstrated doffing pants with theraband to simulate LB dressing. Completed donning paper scrubs over brace with CGA using lateral weight shift to pull pants over hips while seated on mat. Able to direct care and positioning of limbs with instructions to therapy team. Rest breaks provided as pain interventions when needed. Pt returned to room and completed AP transfer wc>bed with close (S) using leg lifter for management of BLE. Washed LB at bed level with setup and completed LB clothing management with VC and  reacher for distal threading of pants. Verbalized anxiety throughout tx of vestibular eval scheduled for later - strategies provided to decrease anxiety. Pt left in bed, needs met, alarm on and call bell nearby.   Therapy Documentation Precautions:  Precautions Precautions: Fall, Other (comment) Precaution Comments: RLE bledsoe brace locked in ext Required Braces or Orthoses: Sling, Other Brace Knee Immobilizer - Right: On at all times Restrictions Weight Bearing Restrictions: Yes RUE Weight Bearing: Non weight bearing LUE Weight Bearing: Partial weight bearing RLE Weight Bearing: Non weight bearing LLE Weight Bearing: Non weight bearing Other Position/Activity Restrictions: LUE non-op acromion fx: minimal weightbearing; will be safest to weightbear/push with arm down at side vs reaching or pulling with left shoulder flexed or abducted. Can do gentle PROM/AROM as tolerated.    Agitated Behavior Scale: TBI     Therapy/Group: Individual Therapy  [name] 02/16/2021, 7:18 AM

## 2021-02-16 NOTE — Progress Notes (Signed)
Physical Therapy TBI Note  Patient Details  Name: Kenneth Bautista MRN: [id number] Date of Birth: 12/21/1985  Today's Date: 02/16/2021 PT Individual Time: 8250-0370 PT Individual Time Calculation (min): 45 min   Short Term Goals: Week 1:  PT Short Term Goal 1 (Week 1): Patient will perform rolling R/L with min A while maintaining all precautions. PT Short Term Goal 2 (Week 1): Patient will perform supine to sit with CGA for safety. PT Short Term Goal 3 (Week 1): Patient will perform A/P transfers with mod A with 1 person assisting. Week 2:     Skilled Therapeutic Interventions/Progress Updates:  PAIN 7/10 RLE, states he received meds prior to session.  Rest breaks and repositioning as needed.    Pt initially supine and agreeable to session.  Supine to long sit w/cues to adhere to wbing precautions esp RUE. Pt able to pivot in log sit then back into wc w/assist to manage Les, min assist/therapist stabilized wc. Transported to gym Wc to mat via forward boost again w/assist to manage Les from legrests to mat.  Therapist acewrapped RUE in sling to chest to increase attention to wbing precaution.  Pt pivots on mat w/assist for Les only.  Therex:  LLE AAROM heel slides, hip abd/add Quad sets 2x15, ankle DF/PF.  Pt w/significant decrease in PROM DF due to soft tissue tightness noted. Pt unable to elicit quad contraction RLE, pain inhibits. Repeated transfer mat to wc as described above. Pt left oob in wc w/alarm belt set and needs in reach   Therapy Documentation Precautions:  Precautions Precautions: Fall, Other (comment) Precaution Comments: RLE bledsoe brace locked in ext Required Braces or Orthoses: Sling, Other Brace Knee Immobilizer - Right: On at all times Restrictions Weight Bearing Restrictions: Yes RUE Weight Bearing: Non weight bearing LUE Weight Bearing: Partial weight bearing RLE Weight Bearing: Non weight bearing LLE Weight Bearing: Non weight bearing Other  Position/Activity Restrictions: LUE non-op acromion fx: minimal weightbearing; will be safest to weightbear/push with arm down at side vs reaching or pulling with left shoulder flexed or abducted. Can do gentle PROM/AROM as tolerated. Pain:  see above Pain Assessment Pain Scale: 0-10 Pain Score: 5  Pain Type: Acute pain Pain Location: Leg Pain Orientation: Right;Left Pain Descriptors / Indicators: Burning Pain Frequency: Constant Pain Onset: On-going Pain Intervention(s): Medication (See eMAR) (oxycodone) Agitated Behavior Scale: TBI Observation Details Observation Environment: room/gym Start of observation period - Date: 02/16/21 Start of observation period - Time: 0845 End of observation period - Date: 02/16/21 End of observation period - Time: 0930 Agitated Behavior Scale (DO NOT LEAVE BLANKS) Short attention span, easy distractibility, inability to concentrate: Present to a slight degree Impulsive, impatient, low tolerance for pain or frustration: Present to a slight degree Uncooperative, resistant to care, demanding: Absent Violent and/or threatening violence toward people or property: Absent Explosive and/or unpredictable anger: Absent Rocking, rubbing, moaning, or other self-stimulating behavior: Absent Pulling at tubes, restraints, etc.: Absent Wandering from treatment areas: Absent Restlessness, pacing, excessive movement: Present to a slight degree Repetitive behaviors, motor, and/or verbal: Absent Rapid, loud, or excessive talking: Present to a slight degree Sudden changes of mood: Absent Easily initiated or excessive crying and/or laughter: Absent Self-abusiveness, physical and/or verbal: Absent Agitated behavior scale total score: 18      Therapy/Group: Individual Therapy Rada Hay, PT   Shearon Balo 02/16/2021, 12:40 PM
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
## 2021-02-16 NOTE — Progress Notes (Signed)
Speech Language Pathology TBI Note  Patient Details  Name: AMMIEL GUINEY MRN: [id number] Date of Birth: [birth year]  Today's Date: 02/16/2021 SLP Individual Time: 7169-6789 SLP Individual Time Calculation (min): 25 min  Short Term Goals: Week 1: SLP Short Term Goal 1 (Week 1): Patient will utilize memory compensatory strategies to recall functional information with Min A multimodal cues. SLP Short Term Goal 2 (Week 1): Patient will demonstrate sustained attention to a functional task for 30 minutes with superivsion level verbal cues for redirection. SLP Short Term Goal 3 (Week 1): Patient will demonstrate functional problem solving for mildly complex tasks with Min verbal cues. SLP Short Term Goal 4 (Week 1): Patient will adhere to weightbearing precautions within a session by requesting assistance for repositioning, etc in 75% of opportunities with Min verbal cues.  Skilled Therapeutic Interventions: Skilled treatment session focused on cognitive goals. Upon arrival, patient reported that he is "always on the go" and is getting bored while here in the hospital. SLP facilitated conversation that focused on awareness, especially in regards to being wheelchair bound at home requiring assistance at home due to inability to propel wheelchair at this time (weightbearing precautions).  With extra time and supervision verbal cues, patient generated a small list of activities/tasks he can do at home to stay cognitively engaged with caregiver assistance. Patient left upright in bed with alarm on and all needs within reach. Continue with current plan of care.      Pain Pain Assessment Pain Scale: 0-10 Pain Score: 5  Pain Type: Acute pain Pain Location: Leg Pain Orientation: Right;Left Pain Descriptors / Indicators: Burning Pain Frequency: Constant Pain Onset: On-going Pain Intervention(s): Medication (See eMAR) (oxycodone)  Agitated Behavior Scale: TBI Observation Details Observation  Environment: Patient's room Start of observation period - Date: 02/16/21 Start of observation period - Time: 0820 End of observation period - Date: 02/16/21 End of observation period - Time: 0845 Agitated Behavior Scale (DO NOT LEAVE BLANKS) Short attention span, easy distractibility, inability to concentrate: Present to a slight degree Impulsive, impatient, low tolerance for pain or frustration: Absent Uncooperative, resistant to care, demanding: Absent Violent and/or threatening violence toward people or property: Absent Explosive and/or unpredictable anger: Absent Rocking, rubbing, moaning, or other self-stimulating behavior: Absent Pulling at tubes, restraints, etc.: Absent Wandering from treatment areas: Absent Restlessness, pacing, excessive movement: Absent Repetitive behaviors, motor, and/or verbal: Absent Rapid, loud, or excessive talking: Absent Sudden changes of mood: Absent Easily initiated or excessive crying and/or laughter: Absent Self-abusiveness, physical and/or verbal: Absent Agitated behavior scale total score: 15  Therapy/Group: Individual Therapy  Levy Cedano 02/16/2021, 12:32 PM

## 2021-02-16 NOTE — Progress Notes (Signed)
Occupational Therapy TBI Note  Patient Details  Name: Kenneth Bautista MRN: [id number] Date of Birth: 11-11-1985  Today's Date: 02/16/2021 OT Individual Time: 9678-9381 OT Individual Time Calculation (min): 41 min    Short Term Goals: Week 1:  OT Short Term Goal 1 (Week 1): Pt will complete UB bathing with setup OT Short Term Goal 2 (Week 1): Pt will complete transfer to wc/BSC with mod A +2 OT Short Term Goal 3 (Week 1): Pt will direct care for B LE management during transfer OT Short Term Goal 4 (Week 1): Pt will complete UB dressing with (S)  Skilled Therapeutic Interventions/Progress Updates:    Pt in bed to start session with focus on treatment of right ear BPPV based on eval results in PT a few days ago.  Completed Epley Maneuver during session for treatment of right anterior canalithiasis.  Noted downward rotational nystagmus lasting for up to 30 seconds with cervical rotation to the right with 20 degrees of extension.  This would continue with second step of the maneuver with head turned and extended to the left.  No noted nystagmus as maneuver continued onto sidelying and then into sitting.  Attempted maneuver X3 during session without resolving nystagmus.  Will continue to monitor and re-attempt in further sessions as pt tolerates.  Pt required total +2 (pt 30%) to complete each maneuver with transition from supine to left side and then into sitting, in order to manage his LEs and trunk secondary to pain, weightbearing restrictions, and flexibility limitations.  Finished session with pt laying in the bed with the call button and phone in reach with safety alarm in place.      Therapy Documentation Precautions:  Precautions Precautions: Fall, Other (comment) Precaution Comments: RLE bledsoe brace locked in ext Required Braces or Orthoses: Sling, Other Brace Knee Immobilizer - Right: On at all times Restrictions Weight Bearing Restrictions: Yes RUE Weight Bearing: Non weight  bearing LUE Weight Bearing: Partial weight bearing RLE Weight Bearing: Non weight bearing LLE Weight Bearing: Non weight bearing Other Position/Activity Restrictions: LUE non-op acromion fx: minimal weightbearing; will be safest to weightbear/push with arm down at side vs reaching or pulling with left shoulder flexed or abducted. Can do gentle PROM/AROM as tolerated.  Pain: Pain Assessment Pain Scale: Faces Pain Score: 5  Faces Pain Scale: Hurts a little bit Pain Type: Acute pain Pain Location: Leg Pain Orientation: Right Pain Descriptors / Indicators: Discomfort Pain Onset: With Activity Pain Intervention(s): Repositioned;Emotional support Agitated Behavior Scale: TBI Observation Details Observation Environment: room Start of observation period - Date: 02/16/21 Start of observation period - Time: 1435 End of observation period - Date: 02/16/21 End of observation period - Time: 1516 Agitated Behavior Scale (DO NOT LEAVE BLANKS) Short attention span, easy distractibility, inability to concentrate: Present to a slight degree Impulsive, impatient, low tolerance for pain or frustration: Present to a slight degree Uncooperative, resistant to care, demanding: Absent Violent and/or threatening violence toward people or property: Absent Explosive and/or unpredictable anger: Absent Rocking, rubbing, moaning, or other self-stimulating behavior: Absent Pulling at tubes, restraints, etc.: Absent Wandering from treatment areas: Absent Restlessness, pacing, excessive movement: Absent Repetitive behaviors, motor, and/or verbal: Absent Rapid, loud, or excessive talking: Present to a slight degree Sudden changes of mood: Absent Easily initiated or excessive crying and/or laughter: Absent Self-abusiveness, physical and/or verbal: Absent Agitated behavior scale total score: 17    Therapy/Group: Individual Therapy  Melannie Metzner OTR/L 02/16/2021, 4:40 PM

## 2021-02-17 DIAGNOSIS — G8918 Other acute postprocedural pain: Secondary | ICD-10-CM

## 2021-02-17 MED ORDER — POLYETHYLENE GLYCOL 3350 17 G PO PACK
17.0000 g | PACK | Freq: Every day | ORAL | Status: DC | PRN
  Administered 2021-02-17: 17 g via ORAL
  Filled 2021-02-17: qty 1
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
## 2021-02-17 NOTE — Progress Notes (Signed)
Physical Therapy Session Note  Patient Details  Name: Kenneth Bautista MRN: [id number] Date of Birth: Oct 28, 1985  Today's Date: 02/17/2021 PT Individual Time: 1400-1500 PT Individual Time Calculation (min): 60 min   Short Term Goals: Week 1:  PT Short Term Goal 1 (Week 1): Patient will perform rolling R/L with min A while maintaining all precautions. PT Short Term Goal 2 (Week 1): Patient will perform supine to sit with CGA for safety. PT Short Term Goal 3 (Week 1): Patient will perform A/P transfers with mod A with 1 person assisting. Week 2:    Week 3:     Skilled Therapeutic Interventions/Progress Updates:  Pain:  Pt reports 6/10 pain w/stretching.  Treatment to tolerance.  Rest breaks and repositioning as needed.  Pt initially supine, states he just returned to bed after 6 hours in wc.  Declined oob but agreeable to treatment session w/focus on LE ROM. LLE AAROM knee flex/ext followed by gravity assisted flexion stretches to approx 85*. TKEs in avail range 2x10 Quad sets 2x10 Hip abd/add 2x10 Glut sets 2x10  R knee gentle patellar mobilization Attempted quad sets, minimal to no acitvation, pain inhibits ability.  Ankle DF to end range bilat Brief gravity assisted knee flexion stretch R knee w/pillow under knee, poorly tolerated, pt became tearful/shaking due to pain.  Ice applied to bilat knees (not directly to skin)/clothing layer between) following therex and pt repositioned for comfort.  Nurse contacted and pain meds administered.   Pt left supine w/rails up x 3, alarm set, bed in lowest position, and needs in reach.    Therapy Documentation Precautions:  Precautions Precautions: Fall, Other (comment) Precaution Comments: RLE bledsoe brace locked in ext Required Braces or Orthoses: Sling, Other Brace Knee Immobilizer - Right: On at all times Restrictions Weight Bearing Restrictions: Yes RUE Weight Bearing: Non weight bearing LUE Weight Bearing: Partial weight  bearing RLE Weight Bearing: Non weight bearing LLE Weight Bearing: Non weight bearing Other Position/Activity Restrictions: LUE non-op acromion fx: minimal weightbearing; will be safest to weightbear/push with arm down at side vs reaching or pulling with left shoulder flexed or abducted. Can do gentle PROM/AROM as tolerated. G   Therapy/Group: Individual Therapy Rada Hay, PT   Shearon Balo 02/17/2021, 4:21 PM

## 2021-02-17 NOTE — Progress Notes (Signed)
Speech Language Pathology TBI Note  Patient Details  Name: Kenneth Bautista MRN: [id number] Date of Birth: 03-28-1986  Today's Date: 02/17/2021 SLP Individual Time: 1016-1100 SLP Individual Time Calculation (min): 44 min  Short Term Goals: Week 1: SLP Short Term Goal 1 (Week 1): Patient will utilize memory compensatory strategies to recall functional information with Min A multimodal cues. SLP Short Term Goal 2 (Week 1): Patient will demonstrate sustained attention to a functional task for 30 minutes with superivsion level verbal cues for redirection. SLP Short Term Goal 3 (Week 1): Patient will demonstrate functional problem solving for mildly complex tasks with Min verbal cues. SLP Short Term Goal 4 (Week 1): Patient will adhere to weightbearing precautions within a session by requesting assistance for repositioning, etc in 75% of opportunities with Min verbal cues.  Skilled Therapeutic Interventions: Pt seen for skilled ST with focus on cognitive goals, pt up in recliner and agreeable to therapy. Pt reports not sleeping well last night due to pain, states his cognition is impacted by fatigue today. SLP facilitating simple alternating attention task by providing mod fading to min A cues for accuracy. Pt reports difficulty with task and need for cues is a change following brain injury. Pt questioning when next pain meds are due, spoke with RN and provided patient written aid to recall when meds last taken and when due again (every 4 hours). Pt encouraged to independently track medication administration via chart, verbalized agreement. Pt left in recliner with alarm belt set and all needs within reach. Cont ST POC.   Pain Pain Assessment Pain Scale: 0-10 Pain Score: 7  Pain Location: Leg Pain Intervention(s): RN made aware  Agitated Behavior Scale: TBI Observation Details Observation Environment: pt room Start of observation period - Date: 02/17/21 Start of observation period - Time:  1015 End of observation period - Date: 02/17/21 End of observation period - Time: 1100 Agitated Behavior Scale (DO NOT LEAVE BLANKS) Short attention span, easy distractibility, inability to concentrate: Present to a slight degree Impulsive, impatient, low tolerance for pain or frustration: Absent Uncooperative, resistant to care, demanding: Absent Violent and/or threatening violence toward people or property: Absent Explosive and/or unpredictable anger: Absent Rocking, rubbing, moaning, or other self-stimulating behavior: Absent Pulling at tubes, restraints, etc.: Absent Wandering from treatment areas: Absent Restlessness, pacing, excessive movement: Absent Repetitive behaviors, motor, and/or verbal: Absent Rapid, loud, or excessive talking: Absent Sudden changes of mood: Absent Easily initiated or excessive crying and/or laughter: Absent Self-abusiveness, physical and/or verbal: Absent Agitated behavior scale total score: 15  Therapy/Group: Individual Therapy  Tacey Ruiz 02/17/2021, 11:00 AM

## 2021-02-17 NOTE — Progress Notes (Signed)
Occupational Therapy TBI Note  Patient Details  Name: Kenneth Bautista MRN: [id number] Date of Birth: [birth year]  Today's Date: 02/17/2021 OT Individual Time: 9480-1655 OT Individual Time Calculation (min): 70 min    Short Term Goals: Week 1:  OT Short Term Goal 1 (Week 1): Pt will complete UB bathing with setup OT Short Term Goal 2 (Week 1): Pt will complete transfer to wc/BSC with mod A +2 OT Short Term Goal 3 (Week 1): Pt will direct care for B LE management during transfer OT Short Term Goal 4 (Week 1): Pt will complete UB dressing with (S)  Skilled Therapeutic Interventions/Progress Updates:     Pt received in bed with unrated pain in RLE, RN delivers medication and repositioined throughout for comform. VC for no hyperventilation during mobility.  ADL:  Pt completes bathing with SU for UB in w/c after toileting, and (S) for lateral lean technique on BSC at EOB to wash peri area/buttocks Pt completes UB dressing with set up Pt completes footwear with (S) after reacher technique demo; and MOD A to don overall with sock aide. Unable to do R sock with sock aide d/t pain/brace in the way Pt completes toileting with (S)/A to stabilize wide for lateral leans Pt completes toileting transfer with (S)/VC for adherence to WB precautions    Therapeutic activity Seated wii bowlin with RUE for ROM (majority at elbow and wrist) and leisure pursuit from w/c level. AP transfer back to bed then to recliner for prolonged sitting with pt able to direct pillow positioning.   Pt left at end of session in recliner with exit alarm on, call light in reach and all needs met   Therapy Documentation  Agitated Behavior Scale: TBI  Observation Details Observation Environment: pt room Start of observation period - Date: 02/17/21 Start of observation period - Time: 0800 End of observation period - Date: 02/17/21 End of observation period - Time: 0915 Agitated Behavior Scale (DO NOT LEAVE  BLANKS) Short attention span, easy distractibility, inability to concentrate: Present to a slight degree Impulsive, impatient, low tolerance for pain or frustration: Present to a slight degree Uncooperative, resistant to care, demanding: Absent Violent and/or threatening violence toward people or property: Absent Explosive and/or unpredictable anger: Absent Rocking, rubbing, moaning, or other self-stimulating behavior: Absent Pulling at tubes, restraints, etc.: Absent Wandering from treatment areas: Absent Restlessness, pacing, excessive movement: Absent Repetitive behaviors, motor, and/or verbal: Absent Rapid, loud, or excessive talking: Present to a slight degree Sudden changes of mood: Absent Easily initiated or excessive crying and/or laughter: Absent Self-abusiveness, physical and/or verbal: Absent Agitated behavior scale total score: 17     Therapy/Group: Individual Therapy  Tonny Branch 02/17/2021, 9:16 AM

## 2021-02-18 LAB — GLUCOSE, CAPILLARY: Glucose-Capillary: 96 mg/dL (ref 70–99)

## 2021-02-18 MED ORDER — BISACODYL 10 MG RE SUPP: 10.0000 mg | Freq: Every day | RECTAL | Status: DC | PRN

## 2021-02-18 MED ORDER — SORBITOL 70 % SOLN
30.0000 mL | Freq: Every day | Status: DC | PRN
  Administered 2021-02-18: 30 mL via ORAL

## 2021-02-19 LAB — BASIC METABOLIC PANEL
Anion gap: 8 (ref 5–15)
BUN: 15 mg/dL (ref 6–20)
CO2: 29 mmol/L (ref 22–32)
Calcium: 9.4 mg/dL (ref 8.9–10.3)
Chloride: 101 mmol/L (ref 98–111)
Creatinine, Ser: 0.77 mg/dL (ref 0.61–1.24)
GFR, Estimated: >60 mL/min (ref >60)
Glucose, Bld: 101 mg/dL — ABNORMAL HIGH (ref 70–99)
Potassium: 4.3 mmol/L (ref 3.5–5.1)
Sodium: 138 mmol/L (ref 135–145)

## 2021-02-19 LAB — CBC
HCT: 32.5 % — ABNORMAL LOW (ref 39.0–52.0)
Hemoglobin: 10.4 g/dL — ABNORMAL LOW (ref 13.0–17.0)
MCH: 29.9 pg (ref 26.0–34.0)
MCHC: 32 g/dL (ref 30.0–36.0)
MCV: 93.4 fL (ref 80.0–100.0)
Platelets: 408 10*3/uL — ABNORMAL HIGH (ref 150–400)
RBC: 3.48 MIL/uL — ABNORMAL LOW (ref 4.22–5.81)
RDW: 13.6 % (ref 11.5–15.5)
WBC: 4.7 10*3/uL (ref 4.0–10.5)
nRBC: 0 % (ref 0.0–0.2)
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
## 2021-02-19 NOTE — Progress Notes (Signed)
Speech Language Pathology TBI Note  Patient Details  Name: Kenneth Bautista MRN: [id number] Date of Birth: January 09, 1986  Today's Date: 02/19/2021 SLP Individual Time: 0930-1000 SLP Individual Time Calculation (min): 30 min  Short Term Goals: Week 1: SLP Short Term Goal 1 (Week 1): Patient will utilize memory compensatory strategies to recall functional information with Min A multimodal cues. SLP Short Term Goal 2 (Week 1): Patient will demonstrate sustained attention to a functional task for 30 minutes with superivsion level verbal cues for redirection. SLP Short Term Goal 3 (Week 1): Patient will demonstrate functional problem solving for mildly complex tasks with Min verbal cues. SLP Short Term Goal 4 (Week 1): Patient will adhere to weightbearing precautions within a session by requesting assistance for repositioning, etc in 75% of opportunities with Min verbal cues.  Skilled Therapeutic Interventions: Patient agreeable to skilled ST intervention with focus on cognitive goals. Upon arrival pt had reached for his call bell and stated "I need to call the nurse to reposition my legs." SLP informed that therapy can also perform repositioning and assisted patient at bed level. Pt reported his weightbearing precautions with 100% accuracy and displayed awareness of physical injuries, however less awareness noted in terms of cognitive deficits. SLP facilitated session by providing min A progressing to sup A verbal cues during medication management task involving anticipatory problem solving of hypothetical scenarios. Patient was left in upright in bed with alarm activated and immediate needs within reach at end of session. Continue per current plan of care.      Pain Pain Assessment Pain Scale: 0-10 Pain Score: 4  Pain Location: Leg Pain Orientation: Right Pain Descriptors / Indicators: Aching Pain Onset: On-going Pain Intervention(s): Repositioned  Agitated Behavior Scale: TBI Observation  Details Observation Environment: pt room Start of observation period - Date: 02/19/21 Start of observation period - Time: 0930 End of observation period - Date: 02/19/21 End of observation period - Time: 1000 Agitated Behavior Scale (DO NOT LEAVE BLANKS) Short attention span, easy distractibility, inability to concentrate: Absent Impulsive, impatient, low tolerance for pain or frustration: Absent Uncooperative, resistant to care, demanding: Absent Violent and/or threatening violence toward people or property: Absent Explosive and/or unpredictable anger: Absent Rocking, rubbing, moaning, or other self-stimulating behavior: Absent Pulling at tubes, restraints, etc.: Absent Wandering from treatment areas: Absent Restlessness, pacing, excessive movement: Absent Repetitive behaviors, motor, and/or verbal: Absent Rapid, loud, or excessive talking: Absent Sudden changes of mood: Absent Easily initiated or excessive crying and/or laughter: Absent Self-abusiveness, physical and/or verbal: Absent Agitated behavior scale total score: 14  Therapy/Group: Individual Therapy  Kenneth Bautista 02/19/2021, 12:48 PM

## 2021-02-19 NOTE — Progress Notes (Signed)
Occupational Therapy Session Note  Patient Details  Name: Kenneth Bautista MRN: [id number] Date of Birth: 03/09/1986  Today's Date: 02/19/2021 OT Individual Time: 1300-1353 OT Individual Time Calculation (min): 53 min    Short Term Goals: Week 1:  OT Short Term Goal 1 (Week 1): Pt will complete UB bathing with setup OT Short Term Goal 2 (Week 1): Pt will complete transfer to wc/BSC with mod A +2 OT Short Term Goal 3 (Week 1): Pt will direct care for B LE management during transfer OT Short Term Goal 4 (Week 1): Pt will complete UB dressing with (S)   Skilled Therapeutic Interventions/Progress Updates:    Pt greeted at time of session sitting up in recliner, no pain despite asking throughout session. Agreeable to OT session focused on AP transfers/slide board transfers and RUE elbow ROM. Pt able to direct care throughout session for setting up transfers, height of bed/wheelchair, placement of leg rests, pillows, etc. Pt also utilizing leg lifter throughout session for RLE with bledsoe. Recliner > bed via AP transfer CGA/Min A mostly to help rocking side/side and cues to not weight bear through RUE. Bed > wheelchair same manner. Transported pt > day room and performed wii bowling using RUE for elbow ROM throughout. While playing wii, focused on conversational dialogue having pt alternate between tasks. Back in room, AP transfer wheelchair > bed > recliner same as above. Alarm on call bell in reach.   Therapy Documentation Precautions:  Precautions Precautions: Fall, Other (comment) Precaution Comments: RLE bledsoe brace locked in ext Required Braces or Orthoses: Sling, Other Brace Knee Immobilizer - Right: On at all times Restrictions Weight Bearing Restrictions: Yes RUE Weight Bearing: Non weight bearing LUE Weight Bearing: Partial weight bearing RLE Weight Bearing: Non weight bearing LLE Weight Bearing: Non weight bearing Other Position/Activity Restrictions: LUE non-op acromion fx:  minimal weightbearing; will be safest to weightbear/push with arm down at side vs reaching or pulling with left shoulder flexed or abducted. Can do gentle PROM/AROM as tolerated.    Therapy/Group: Individual Therapy  Erasmo Score 02/19/2021, 1:48 PM

## 2021-02-19 NOTE — Progress Notes (Signed)
Physical Therapy Session Note  Patient Details  Name: Kenneth Bautista MRN: [id number] Date of Birth: Nov 12, 1985  Today's Date: 02/19/2021 PT Individual Time: 1430-1530 [id number] (AM session) PT Individual Time Calculation (min): 60 min and 60 min.  Short Term Goals: Week 1:  PT Short Term Goal 1 (Week 1): Patient will perform rolling R/L with min A while maintaining all precautions. PT Short Term Goal 2 (Week 1): Patient will perform supine to sit with CGA for safety. PT Short Term Goal 3 (Week 1): Patient will perform A/P transfers with mod A with 1 person assisting.  Skilled Therapeutic Interventions/Progress Updates:   First session:  Pt presents sitting in bed w/ HOB elevated.  Pt agreeable to participate in therapy.  PT removed PRAFOs for pt to perform light QS to R knee, AP w/ long stretches to B HCs w/ c/o pain to R > L.  Pt using leg lifter to perform scoot around on bed to perform AP transfer to w/c.  Pt requires total A to place SB, but able to lean side to side for placement.  Pt required only CGA to min A occasionally 2/2 pants sliding down.  Pt wheeled to Dayroom for time conservation.  Pt states some dizziness "the room is spinning" but dissipates w/ rest x 2'.   Pt performed AP transfer w/c > mat table using leg lifter to advance R LE and CGA.  Pt required pillow case under hips 2/2 difficulty w/ scooting around to perform transfer posterior into w/c w/o slide board.  Pt performed multiple trials of long sitting to improve core strength.  Pt requires multiple cueing for maintaining NWB to RUE.  Pt returned to room and performed AP onto bed w/ min A and verbal cues, w/c slipping a bit during transfer.  Pt then performed AP transfer into recliner w/ pillow bridging gap and use of Chux sheet.  Pt performed abd/add 2 x 10 w/ AAROM for friction relief, R > LLE.  PRAFOS donned and remained sitting in recliner w/ chair alarm on and all needs in reach.  Second session:  Pt presents sitting in  recliner and agreeable to therapy.  Pt states no pain at this time.  Pt positioned facing bed in recliner to perform AP transfer onto bed then back into w/c w/ SB.  Pt requires CGA occasionally mostly for clothing management as well as cueing for NWB RUE, pt stating that he can put hand down w/o WB, but pt does bear weight unless cued.  Pt wheeled to main gym and performed AP transfer w/c > mat table w/o SB required.  Pt using leg lifter to re-position RLE.  Pt turned and performed multiple sup to sit from wedge.  Pt performed "plumbing exercises from long sitting position for core strengthening and cognition.  Pt also performed sup to sit and throwing light ball and then sit to supine x 10 reps.  Pt required rest breaks between trials 2/2 pain in RLE (7/10).  Pt returned to room and performed AP transfers w/c to bed w/ SB placed by PT.  Pt then transferred to recliner w/ CGA and verbal cues for RLE restrictions.  Chair alarm on and all needs in reach.     Therapy Documentation Precautions:  Precautions Precautions: Fall, Other (comment) Precaution Comments: RLE bledsoe brace locked in ext Required Braces or Orthoses: Sling, Other Brace Knee Immobilizer - Right: On at all times Restrictions Weight Bearing Restrictions: Yes RUE Weight Bearing: Non weight bearing LUE Weight  Bearing: Partial weight bearing RLE Weight Bearing: Non weight bearing LLE Weight Bearing: Non weight bearing Other Position/Activity Restrictions: LUE non-op acromion fx: minimal weightbearing; will be safest to weightbear/push with arm down at side vs reaching or pulling with left shoulder flexed or abducted. Can do gentle PROM/AROM as tolerated. General:   Vital Signs: Therapy Vitals Temp: 98.1 F (36.7 C) Temp Source: Oral Pulse Rate: 72 Resp: 17 BP: 123/76 Patient Position (if appropriate): Sitting Oxygen Therapy SpO2: 99 % O2 Device: Room Air Pain: 7/10 RLE, pain meds given during therapy session. Pain  Assessment Pain Score: 4  Mobility:  Other Treatments:      Therapy/Group: Individual Therapy  Kenneth Bautista 02/19/2021, 3:32 PM

## 2021-02-20 NOTE — Progress Notes (Signed)
Physical Therapy TBI Note  Patient Details  Name: Kenneth Bautista MRN: [id number] Date of Birth: Jul 21, 1985  Today's Date: 02/20/2021 PT Individual Time: [phone] and [phone] PT Individual Time Calculation (min): 39 min and 39 min  Short Term Goals: Week 1:  PT Short Term Goal 1 (Week 1): Patient will perform rolling R/L with min A while maintaining all precautions. PT Short Term Goal 2 (Week 1): Patient will perform supine to sit with CGA for safety. PT Short Term Goal 3 (Week 1): Patient will perform A/P transfers with mod A with 1 person assisting.  Skilled Therapeutic Interventions/Progress Updates:     1st Session: Pt received supine in bed and agrees to therapy. Reports pain in both legs R>L. Number not provided. PT provides rest breaks as needed to manage pain. Pt performs supine to sit with bed features and verbal cues for positioning at EOB.Pt requesting to "wash-up". Pt performs AP transfer with slideboard from bed to [facility] with CGA/minA and cues for body mechanics as well as reminder to not sue RUE for WB to maintain precautions. Pt doffs shirt independently and performs upper body washcloth wipe-down with set-up assistance. Pt then performs lateral unweighting of hips with cues on body mechanics to doff pants with minA/modA from PT. Folloiwng washdown of lower body, PT threads pants over each leg and pt performs alternating lateral leans to pull up pants from knees. Pt dons shirt independently. WC transport to gym, where pt and PT discuss requirements for safe DC, including need to perform car transfer. Pt describes potential use of friend's [employer] and utilizing backseat with legs stretched across seat. PT suggests use of bedsheet to decrease friction and provide caretakers with support to lift pt as needed. Car transfer deferred to PM session due to time. Pt performs lateral slideboard transfer to recliner with minA and cues for sequencing. Left seated in recliner with alarm intact  and all needs within reach.  2nd Session: Pt received seated in [facility] and agrees to therapy. Reports pain at 5/10 level, with most pain being in R leg. PT provides repositioning and rest breaks as needed to manage pain, and RN provides pain meds during session. WC transport to gym for time management. Session focused on trialing car transfer. Initially pt performs alternating unloading of ischial tuberosities so that PT can position sheet under pt for transfer. Pt then approaches car seat in anterior direction. PT utilizes sheet to support pt's legs as legrests removed from [facility] and pt's lower extremities then propped on car seat. Slideboard placed between legs bridging car seat and WC. Pt then performs "posterior-anterior" slideboard transfer into car with minA and cues for body mechanics, as well as reminder to maintain NWB through R upper extremity as pt tends to move impulsively and push through R arm. Pt performs transfer back to [facility] in anterior-posterior direction with slideboard and minA. Sheet used to support legs as legrests of WC placed. WC transport back to room. AP transfer to bed with slideboard and CGA/minA. Pt positioned for comfort with heels floated and PRAFO boots on both feet. Pt left supine with alarm intact and all needs within reach.  Therapy Documentation Precautions:  Precautions Precautions: Fall, Other (comment) Precaution Comments: RLE bledsoe brace locked in ext Required Braces or Orthoses: Sling, Other Brace Knee Immobilizer - Right: On at all times Restrictions Weight Bearing Restrictions: Yes RUE Weight Bearing: Non weight bearing LUE Weight Bearing: Partial weight bearing RLE Weight Bearing: Non weight bearing LLE Weight Bearing:  Non weight bearing Other Position/Activity Restrictions: LUE non-op acromion fx: minimal weightbearing; will be safest to weightbear/push with arm down at side vs reaching or pulling with left shoulder flexed or abducted. Can do gentle PROM/AROM as  tolerated.  Agitated Behavior Scale: TBI Observation Details Observation Environment: pt room (Simultaneous filing. User may not have seen previous data.) Start of observation period - Date: 02/20/21 (Simultaneous filing. User may not have seen previous data.) Start of observation period - Time: 1330 (Simultaneous filing. User may not have seen previous data.) End of observation period - Date: 02/20/21 (Simultaneous filing. User may not have seen previous data.) End of observation period - Time: 1430 (Simultaneous filing. User may not have seen previous data.) Agitated Behavior Scale (DO NOT LEAVE BLANKS) Short attention span, easy distractibility, inability to concentrate: Absent (Simultaneous filing. User may not have seen previous data.) Impulsive, impatient, low tolerance for pain or frustration: Absent (Simultaneous filing. User may not have seen previous data.) Uncooperative, resistant to care, demanding: Absent (Simultaneous filing. User may not have seen previous data.) Violent and/or threatening violence toward people or property: Absent (Simultaneous filing. User may not have seen previous data.) Explosive and/or unpredictable anger: Absent (Simultaneous filing. User may not have seen previous data.) Rocking, rubbing, moaning, or other self-stimulating behavior: Absent (Simultaneous filing. User may not have seen previous data.) Pulling at tubes, restraints, etc.: Absent (Simultaneous filing. User may not have seen previous data.) Wandering from treatment areas: Absent (Simultaneous filing. User may not have seen previous data.) Restlessness, pacing, excessive movement: Absent (Simultaneous filing. User may not have seen previous data.) Repetitive behaviors, motor, and/or verbal: Absent (Simultaneous filing. User may not have seen previous data.) Rapid, loud, or excessive talking: Absent (Simultaneous filing. User may not have seen previous data.) Sudden changes of mood: Absent  (Simultaneous filing. User may not have seen previous data.) Easily initiated or excessive crying and/or laughter: Absent (Simultaneous filing. User may not have seen previous data.) Self-abusiveness, physical and/or verbal: Absent (Simultaneous filing. User may not have seen previous data.) Agitated behavior scale total score: 14 (Simultaneous filing. User may not have seen previous data.)  Therapy/Group: Individual Therapy  Beau Fanny, PT, DPT 02/20/2021, 3:43 PM

## 2021-02-20 NOTE — Progress Notes (Signed)
Occupational Therapy Session Note  Patient Details  Name: Kenneth Bautista MRN: [id number] Date of Birth: 12-Jun-1985  Today's Date: 02/20/2021 OT Individual Time: 1330-1430 OT Individual Time Calculation (min): 60 min    Short Term Goals: Week 1:  OT Short Term Goal 1 (Week 1): Pt will complete UB bathing with setup OT Short Term Goal 2 (Week 1): Pt will complete transfer to wc/BSC with mod A +2 OT Short Term Goal 3 (Week 1): Pt will direct care for B LE management during transfer OT Short Term Goal 4 (Week 1): Pt will complete UB dressing with (S)  Skilled Therapeutic Interventions/Progress Updates:    Patient seated in bed, states that he just got into bed and is tired this afternoon.  Mom is present for session.  He notes that pain is under control at this time.  Completed right AROM without weight or resistance to tolerance, left distal AROM with 3# weight, proximal AROM without resistance to tolerance.  He notes mild fatigue with upper body exercises.  Reviewed DME for home and standard vs bariatric drop arm commodes.  Discussed safety with adl and transfers.  He demonstrates good understanding of weight bearing precautions but requires guidance during tasks as he tends to attempt to use left leg and right arm during transfer.  Completed turn in bed and posterior transfer with slide board to w/c min A as pants did not slide easily on sheets/bed pad.  Max A to place leg rests and secure legs in extension.  He is able to direct care and positioning.  Completed trunk exercises with fair tolerance due to fatigue.  He remained in w/c at close of session, awaiting upcoming PT session, seat belt alarm set and call bell in reach.    Therapy Documentation Precautions:  Precautions Precautions: Fall, Other (comment) Precaution Comments: RLE bledsoe brace locked in ext Required Braces or Orthoses: Sling, Other Brace Knee Immobilizer - Right: On at all times Restrictions Weight Bearing Restrictions:  Yes RUE Weight Bearing: Non weight bearing LUE Weight Bearing: Partial weight bearing RLE Weight Bearing: Non weight bearing LLE Weight Bearing: Non weight bearing Other Position/Activity Restrictions: LUE non-op acromion fx: minimal weightbearing; will be safest to weightbear/push with arm down at side vs reaching or pulling with left shoulder flexed or abducted. Can do gentle PROM/AROM as tolerated.  Therapy/Group: Individual Therapy  Barrie Lyme 02/20/2021, 7:35 AM

## 2021-02-20 NOTE — Progress Notes (Addendum)
Physical Therapy Weekly Progress Note  Patient Details  Name: Kenneth Bautista MRN: [id number] Date of Birth: 03/15/1986  Beginning of progress report period: February 13, 2021 End of progress report period: February 20, 2021  Today's Date: 02/20/2021 PT Individual Time: 1032-1100 PT Individual Time Calculation (min): 28 min   Patient has met 3 of 3 short term goals.  Patient made excellent progress this week progressing to CGA-min A with bed mobility and transfers bed<>w/c with 1 person assist using a slide board and A/P or lateral transfers. Patient has tolerated 2 treatment for R anterior canalithiasis and reports improvement of symptoms with reduced frequency. Will continue vestibular treatment throughout patient's stay. Focusing on progressing patient with HEP following surgical protocol for R lower extremity, caregiver education, and car transfers in preparation for d/c home.   Patient continues to demonstrate the following deficits muscle weakness and muscle joint tightness, decreased cardiorespiratoy endurance, decreased attention, decreased awareness, decreased problem solving, decreased safety awareness, and decreased memory, and peripheral and therefore will continue to benefit from skilled PT intervention to increase functional independence with mobility.  Patient progressing toward long term goals.  Continue plan of care.  PT Short Term Goals Week 1:  PT Short Term Goal 1 (Week 1): Patient will perform rolling R/L with min A while maintaining all precautions. PT Short Term Goal 1 - Progress (Week 1): Met PT Short Term Goal 2 (Week 1): Patient will perform supine to sit with CGA for safety. PT Short Term Goal 2 - Progress (Week 1): Met PT Short Term Goal 3 (Week 1): Patient will perform A/P transfers with mod A with 1 person assisting. PT Short Term Goal 3 - Progress (Week 1): Met Week 2:  PT Short Term Goal 1 (Week 2): STG=LTG due to ELOS  Skilled Therapeutic  Interventions/Progress Updates:     Patient in bed with is mother in the room upon PT arrival. Patient reported 4/10 R>L lower extremity pain during session, RN made aware. PT provided repositioning, rest breaks, and distraction as pain interventions throughout session.   Focused session on d/c planning and discussing POC and ELOS with patient and his mother to set expectations for d/c. Patient's mother reports that the ramp to the home was built, but was too long for the apartment complex and had to be redone. Stated that it would not be completed until this weekend. Discussed report from another PT this morning that the patient would have assistance at home as soon as this week. Patient reports that this would be from an ex-girlfriend, but would have to be confirmed, as his primary caregiver would not be unavailable until 03/03/21. Patient and his mother expressed concerns and distress over d/c planning and assistance level needed. Agreeable to work toward supervision level goals to reduce caregiver burden and plan for d/c on Monday 9/26 pending approval from the rest of the rehab team.   Discussed recommendation of hospital bed, as patient uses the hospital bed features for positioning, pressure relief, and maintaining weight bearing precautions with bed mobility. Patient reports that he does have room for a hospital bed and w/c use in his apartment and in agreement with PT recommendation. CSW made aware of DME recs.   Educated on protocol for strengthening and ROM for B lower extremities and focus on review of HEP handout tomorrow. Discussed focus on car transfers, increased independence with bed<>w/c transfers, vestibular repositioning maneuvers, and caregiver training/education as focus for patient's remainder of stay. Patient and his mother in  agreement.  Patient in bed with his mother at bedside at end of session with breaks locked, bed alarm set, and all needs within reach.   Therapy  Documentation Precautions:  Precautions Precautions: Fall, Other (comment) Precaution Comments: RLE bledsoe brace locked in ext Required Braces or Orthoses: Sling, Other Brace Knee Immobilizer - Right: On at all times Restrictions Weight Bearing Restrictions: Yes RUE Weight Bearing: Non weight bearing LUE Weight Bearing: Partial weight bearing RLE Weight Bearing: Non weight bearing LLE Weight Bearing: Non weight bearing Other Position/Activity Restrictions: LUE non-op acromion fx: minimal weightbearing; will be safest to weightbear/push with arm down at side vs reaching or pulling with left shoulder flexed or abducted. Can do gentle PROM/AROM as tolerated.   Therapy/Group: Individual Therapy  Kacy Hegna L Shantee Hayne PT, DPT  02/20/2021, 4:27 PM
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
## 2021-02-20 NOTE — Progress Notes (Addendum)
Patient ID: Kenneth Bautista, male   DOB: July 10, 1985, 35 y.o.   MRN: [id number]  SW met with pt and pt mother in room to provide updates from team conference, and d/c date change from 9/23 to 9/26 to allow them to have ramp built. Pt mother reports the ramp was too long so requires it to be re-built. Also states that the friend from [place] is not coming, and their friend Kenneth Bautista will now stay with him. SW asked about family education and f/u for this Thursday or Friday. Reports she will f/u with SW to confirm date/time. Pt mother reported in therapy there was a discussion about a hospital bed. Sw explained rental process with w/c and hospital beds. Reported concerns about making payments due to limited income. SW informed will f/u once there is more information. SW to order DME: RW, w/c, 30in slide board and DABSC.   SW ordered DME with [employer] via parachute.   1554-SW received phone call from [name] reporting Kenneth Bautista will be here on Thursday 10am-12pm.  *SW received updates from medical team hospital bed needed. SW requested vendor to discuss with pt financial assistance since no longer employed.SW spoke with [name] [phone]) about updates.   Loralee Pacas, MSW, [place] Office: [phone] Cell: [phone] Fax: [phone]
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
## 2021-02-20 NOTE — Plan of Care (Signed)
  Problem: RH Bed Mobility Goal: LTG Patient will perform bed mobility with assist (PT) Description: LTG: Patient will perform bed mobility with assistance, with/without cues (PT). Flowsheets (Taken 02/20/2021 2012) LTG: Pt will perform bed mobility with assistance level of: (upgraded goal due to patient's progress with mobility.) Supervision/Verbal cueing Note: Using hospital bed features.   Problem: RH Bed to Chair Transfers Goal: LTG Patient will perform bed/chair transfers w/assist (PT) Description: LTG: Patient will perform bed to chair transfers with assistance (PT). Flowsheets (Taken 02/20/2021 2012) LTG: Pt will perform Bed to Chair Transfers with assistance level: (Upgrade goal due to patient's progress with mobility and to reduce caregiver burden at d/c.) Supervision/Verbal cueing Note: Upgrade goal due to patient's progress with mobility and to reduce caregiver burden at d/c.

## 2021-02-21 MED ORDER — GABAPENTIN 100 MG PO CAPS
100.0000 mg | ORAL_CAPSULE | Freq: Three times a day (TID) | ORAL | Status: DC
  Administered 2021-02-21 – 2021-02-22 (×4): 100 mg via ORAL
  Filled 2021-02-21 (×4): qty 1

## 2021-02-21 NOTE — Progress Notes (Signed)
Physical Therapy TBI Note  Patient Details  Name: Kenneth Bautista MRN: [id number] Date of Birth: 01/15/1986  Today's Date: 02/21/2021 PT Individual Time: 0815-0900 PT Individual Time Calculation (min): 45 min  and Today's Date: 02/21/2021 PT Missed Time: 15 Minutes Missed Time Reason: Pain  Short Term Goals: Week 2:  PT Short Term Goal 1 (Week 2): STG=LTG due to ELOS  Skilled Therapeutic Interventions/Progress Updates:     Pt received supine in bed, awaiting pain meds and requesting to defer PT until receiving. Reports 8/10 pain. Pt misses 15 minutes at beginning of session due to pain. Upon PT follow-up, pt agreeable to therapy. Pt performs AP transfer from bed to recliner with minA and consistent reminders to keep R arm rucked into chest to prevent use, as pt is impulsive and frequently breaking NWB precautions. PT also provides assistance at hips to clear pillow in chair. Once in chair, pt provided with HEP for PCL/ACL protocol. Pt performs quad sets with bilateral lower extremities x15 with tactile cues at patellar tendon to facilitate optimal contraction. Pt then performs x10 SLRs AAROM on R leg and AROM on L leg. PT cues for optimal performance and minimizing extensor lag. PT demonstrates patellar mobilization and assists pt with x10 glides in each direction, inferiorly/superiorly, and R/L laterally. Pt left seated in recliner with all needs within reach.  Therapy Documentation Precautions:  Precautions Precautions: Fall, Other (comment) Precaution Comments: RLE bledsoe brace locked in ext Required Braces or Orthoses: Sling, Other Brace Knee Immobilizer - Right: On at all times Restrictions Weight Bearing Restrictions: Yes RUE Weight Bearing: Non weight bearing LUE Weight Bearing: Partial weight bearing RLE Weight Bearing: Non weight bearing LLE Weight Bearing: Non weight bearing Other Position/Activity Restrictions: LUE non-op acromion fx: minimal weightbearing; will be safest to  weightbear/push with arm down at side vs reaching or pulling with left shoulder flexed or abducted. Can do gentle PROM/AROM as tolerated. Agitated Behavior Scale: TBI Observation Details Observation Environment: Pt's room Start of observation period - Date: 02/21/21 Start of observation period - Time: 0800 End of observation period - Date: 02/21/21 End of observation period - Time: 0900 Agitated Behavior Scale (DO NOT LEAVE BLANKS) Short attention span, easy distractibility, inability to concentrate: Present to a slight degree Impulsive, impatient, low tolerance for pain or frustration: Present to a slight degree Uncooperative, resistant to care, demanding: Absent Violent and/or threatening violence toward people or property: Absent Explosive and/or unpredictable anger: Absent Rocking, rubbing, moaning, or other self-stimulating behavior: Absent Pulling at tubes, restraints, etc.: Absent Wandering from treatment areas: Absent Restlessness, pacing, excessive movement: Absent Repetitive behaviors, motor, and/or verbal: Absent Rapid, loud, or excessive talking: Absent Sudden changes of mood: Absent Easily initiated or excessive crying and/or laughter: Absent Self-abusiveness, physical and/or verbal: Absent Agitated behavior scale total score: 16   Therapy/Group: Individual Therapy  Beau Fanny, PT, DPT 02/21/2021, 4:28 PM

## 2021-02-21 NOTE — Plan of Care (Signed)
  Problem: Consults Goal: RH BRAIN INJURY PATIENT EDUCATION Description: Description: See Patient Education module for eduction specifics Outcome: Progressing Goal: Skin Care Protocol Initiated - if Braden Score 18 or less Description: If consults are not indicated, leave blank or document N/A Outcome: Progressing   Problem: RH BOWEL ELIMINATION Goal: RH STG MANAGE BOWEL WITH ASSISTANCE Description: STG Manage Bowel with min Assistance. Outcome: Progressing   Problem: RH BLADDER ELIMINATION Goal: RH STG MANAGE BLADDER WITH ASSISTANCE Description: STG Manage Bladder With min Assistance Outcome: Progressing   Problem: RH SKIN INTEGRITY Goal: RH STG MAINTAIN SKIN INTEGRITY WITH ASSISTANCE Description: STG Maintain Skin Integrity With Min Assistance. Outcome: Progressing Goal: RH STG ABLE TO PERFORM INCISION/WOUND CARE W/ASSISTANCE Description: STG Able To Perform Incision/Wound Care With min Assistance. Outcome: Progressing   Problem: RH SAFETY Goal: RH STG ADHERE TO SAFETY PRECAUTIONS W/ASSISTANCE/DEVICE Description: STG Adhere to Safety Precautions With min Assistance/Device. Outcome: Progressing Goal: RH STG DECREASED RISK OF FALL WITH ASSISTANCE Description: STG Decreased Risk of Fall With cues and reminders. Outcome: Progressing   Problem: RH COGNITION-NURSING Goal: RH STG USES MEMORY AIDS/STRATEGIES W/ASSIST TO PROBLEM SOLVE Description: STG Uses Memory Aids/Strategies With cues and reminders to Problem Solve. Outcome: Progressing Goal: RH STG ANTICIPATES NEEDS/CALLS FOR ASSIST W/ASSIST/CUES Description: STG Anticipates Needs/Calls for Assist With min Assistance/Cues. Outcome: Progressing   Problem: RH PAIN MANAGEMENT Goal: RH STG PAIN MANAGED AT OR BELOW PT'S PAIN GOAL Description: < 3 on a 0-10 pain scale. Outcome: Progressing   Problem: RH KNOWLEDGE DEFICIT BRAIN INJURY Goal: RH STG INCREASE KNOWLEDGE OF SELF CARE AFTER BRAIN INJURY Description: Patient will  demonstrate knowledge of weight bearing precautions, medication management, pain management, skin/wound care with educational materials and handouts provided by staff independently at discharge. Outcome: Progressing

## 2021-02-21 NOTE — Progress Notes (Signed)
Speech Language Pathology Weekly Progress and Session Note  Patient Details  Name: Kenneth Bautista MRN: [id number] Date of Birth: 1985-12-14  Beginning of progress report period: February 14, 2021 End of progress report period: February 21, 2021  Today's Date: 02/21/2021 SLP Individual Time: 1100-1130 SLP Individual Time Calculation (min): 30 min  Short Term Goals: Week 1: SLP Short Term Goal 1 (Week 1): Patient will utilize memory compensatory strategies to recall functional information with Min A multimodal cues. SLP Short Term Goal 1 - Progress (Week 1): Met SLP Short Term Goal 2 (Week 1): Patient will demonstrate sustained attention to a functional task for 30 minutes with superivsion level verbal cues for redirection. SLP Short Term Goal 2 - Progress (Week 1): Met SLP Short Term Goal 3 (Week 1): Patient will demonstrate functional problem solving for mildly complex tasks with Min verbal cues. SLP Short Term Goal 3 - Progress (Week 1): Met SLP Short Term Goal 4 (Week 1): Patient will adhere to weightbearing precautions within a session by requesting assistance for repositioning, etc in 75% of opportunities with Min verbal cues. SLP Short Term Goal 4 - Progress (Week 1): Met    New Short Term Goals: Week 2: SLP Short Term Goal 1 (Week 2): ST=LTG due to ELOS  Weekly Progress Updates: Patient has made excellent progress with ST over the past week and has met 4 of 4 short term goals this reporting period due to improved problem solving, sustained and alternating attention, functional recall, and expressive communication with minimal word finding difficulty observed with complex conversation. Patient is currently completing cognitive-linguistic tasks with supervision-to-min A verbal cues. Patient continues to benefit from at least supervision cues for utilization of compensatory memory strategies. Patient/family education is ongoing. Recommend continued skilled ST intervention to maximize  cognitive function and functional independence prior to discharge. Continue progression toward established LTGs set at supervision assist level overall.    Intensity: Minumum of 1-2 x/day, 30 to 90 minutes Frequency: 3 to 5 out of 7 days Duration/Length of Stay: 9/26 Treatment/Interventions: Cognitive remediation/compensation;Internal/external aids;Cueing hierarchy;Environmental controls;Therapeutic Activities;Functional tasks;Patient/family education;Speech/Language facilitation  Daily Session Skilled Therapeutic Interventions: Patient agreeable to skilled ST intervention with focus on cognitive goals. Pt was accompanied by his mother and friend on this date who were receptive to education on cognitive impairments, focus of SLP treatment, and attention and memory strategies. SLP facilitated session by providing mod A verbal cues progressing to min A for working memory, problem solving/reasoning, and alternating attention during medication management task where pt was asked to identify and repair medication administration errors in BID pillbox. He initially exhibited decreased self monitoring and error awareness throughout task and often repeated the same task over and over again with minimal awareness that it had already been completed. However, this improved as task progressed. Patient was left in chair with alarm activated and immediate needs within reach at end of session. Continue per current plan of care.    General    Pain Pain Assessment Pain Score: 0-No pain  Therapy/Group: Individual Therapy  Patty Sermons 02/21/2021, 4:33 PM

## 2021-02-21 NOTE — Progress Notes (Signed)
Occupational Therapy Weekly Progress Note  Patient Details  Name: Kenneth Bautista MRN: [id number] Date of Birth: [birth year]  Beginning of progress report period: February 14, 2021 End of progress report period: February 21, 2021  Today's Date: 02/21/2021 OT Individual Time: 1000-1100 OT Individual Time Calculation (min): 60 min   WB precautions d/t fx limit participation in functional activities. Pt demonstrates significant progress in skilled OT by abilities to direct caregivers, managing LB during transfers and learning compensatory strategies. Strong potential for therapy is indicated by achievement of STG. Pt completes BADLs with CGA and MIN cuing for WB precautions. Currently using wc with SB for transfers & BSC/urinal for all toileting.  Patient has met 4 of 4 short term goals.    Patient continues to demonstrate the following deficits: muscle weakness, decreased cardiorespiratoy endurance, decreased safety awareness and decreased memory, peripheral, and difficulty maintaining precautions and therefore will continue to benefit from skilled OT intervention to enhance overall performance with BADL, iADL, and Reduce care partner burden.  Patient progressing toward long term goals..  Continue plan of care.  OT Short Term Goals Week 1:  OT Short Term Goal 1 (Week 1): Pt will complete UB bathing with setup OT Short Term Goal 2 (Week 1): Pt will complete transfer to wc/BSC with mod A +2 OT Short Term Goal 3 (Week 1): Pt will direct care for B LE management during transfer OT Short Term Goal 4 (Week 1): Pt will complete UB dressing with (S)  Skilled Therapeutic Interventions/Progress Updates:    Pt received in recliner with pain in R leg. Repositioning provided for pain relief. Pt declined ice. Hospital bed to be delivered on Monday. Pt reports ex-girlfriend will be primary caregiver when d/c home.   ADL: Pt completes BADL at overall CGA-MIN cuing level. Skilled interventions include: Pt  in recliner with mom in room. Agreeable to OT and requesting to toilet. Able to direct OTS for transfer positioning and used leg lifter for BLE management. Completed AP transfer using SB recliner > bed > bariatric BSC with MOD cuing for WB precautions R UE/ BLE. Had BM/voided urine on BSC, able to complete peri-care with setup. Worked on transferring to elevated mat > wc and using belt instead of leg lifter to position legs. Able to transfer with MIN cuing and CGA. Discussed directional cuing for caregiver(s) when d/c home, with focus on strategies to manage pain (e.g. setting up R leg rest first d/t limited tolerance of unsupported positioning). Requires guidance during transfers for NWB on RUE as he still attempts to use dominant hand for pushing.    Therapeutic Activity: Discussed home setup for cooking activities. Pt reported ex-girlfriend will assist with meals but would like to access snacks/drinks from fridge. Education provided on strategies for frequently used object placement on lower counters/fridge shelves for easy access from wc level. Pt verbalized understanding. Returned to room and completed AP transfer back to recliner as noted above. Pt left at end of session in recliner with call light in reach and all needs met. Mother and friend present in room when OT/OTS exited.    Therapy Documentation Precautions:  Precautions Precautions: Fall, Other (comment) Precaution Comments: RLE bledsoe brace locked in ext Required Braces or Orthoses: Sling, Other Brace Knee Immobilizer - Right: On at all times Restrictions Weight Bearing Restrictions: Yes RUE Weight Bearing: Non weight bearing LUE Weight Bearing: Partial weight bearing RLE Weight Bearing: Non weight bearing LLE Weight Bearing: Non weight bearing Other Position/Activity Restrictions: LUE non-op acromion  fx: minimal weightbearing; will be safest to weightbear/push with arm down at side vs reaching or pulling with left shoulder  flexed or abducted. Can do gentle PROM/AROM as tolerated.   Therapy/Group: Individual Therapy  Bailyn Spackman 02/21/2021, 6:59 AM
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
## 2021-02-22 MED ORDER — GABAPENTIN 100 MG PO CAPS
200.0000 mg | ORAL_CAPSULE | Freq: Three times a day (TID) | ORAL | Status: DC
  Administered 2021-02-22 – 2021-02-26 (×12): 200 mg via ORAL
  Filled 2021-02-22 (×12): qty 2

## 2021-02-22 NOTE — Progress Notes (Addendum)
Patient ID: Kenneth Bautista, male   DOB: [birth year], 35 y.o.   MRN: [id number]  SW met with pt, pt mother, and pt friend [name] to review discharge in which pt will not have therapies until NWB status is lifted. SW confirms all DME delivered to patient. Pt mother reports hospital bed will be delivered to the home on Monday.   Loralee Pacas, MSW, [place] Office: [phone] Cell: [phone] Fax: [phone]
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
## 2021-02-22 NOTE — Progress Notes (Signed)
Speech Language Pathology TBI Note  Patient Details  Name: Kenneth Bautista MRN: [id number] Date of Birth: 07/07/1985  Today's Date: 02/22/2021 SLP Individual Time: 1130-1200 SLP Individual Time Calculation (min): 30 min  Short Term Goals: Week 2: SLP Short Term Goal 1 (Week 2): ST=LTG due to ELOS  Skilled Therapeutic Interventions: Skilled treatment session focused on completion of patient and family education with the patient's mom and friend. SLP facilitated session by providing education regarding patient's current cognitive deficits and strategies to utilize at home to maximize recall, problem solving, safety and overall cognitive engagement. All verbalized understanding and handouts were also given to reinforce information. Patient left upright in bed with alarm on and all needs within reach. Continue with current plan of care.      Pain Pain Assessment Pain Scale: 0-10 Pain Score: 6  Pain Type: Acute pain Pain Location: Leg Pain Orientation: Right Pain Descriptors / Indicators: Aching Pain Frequency: Constant Pain Onset: On-going Patients Stated Pain Goal: 1 Pain Intervention(s): Medication (See eMAR) Multiple Pain Sites: No  Agitated Behavior Scale: TBI Observation Details Observation Environment: Patient's room Start of observation period - Date: 02/22/21 Start of observation period - Time: 1130 End of observation period - Date: 02/22/21 End of observation period - Time: 1200 Agitated Behavior Scale (DO NOT LEAVE BLANKS) Short attention span, easy distractibility, inability to concentrate: Present to a slight degree Impulsive, impatient, low tolerance for pain or frustration: Present to a slight degree Uncooperative, resistant to care, demanding: Absent Violent and/or threatening violence toward people or property: Absent Explosive and/or unpredictable anger: Absent Rocking, rubbing, moaning, or other self-stimulating behavior: Absent Pulling at tubes, restraints,  etc.: Absent Wandering from treatment areas: Absent Restlessness, pacing, excessive movement: Absent Repetitive behaviors, motor, and/or verbal: Absent Rapid, loud, or excessive talking: Absent Sudden changes of mood: Absent Easily initiated or excessive crying and/or laughter: Absent Self-abusiveness, physical and/or verbal: Absent Agitated behavior scale total score: 16  Therapy/Group: Individual Therapy  Laprecious Austill 02/22/2021, 3:25 PM

## 2021-02-22 NOTE — Progress Notes (Signed)
Occupational Therapy Session Note   Patient Details  Name: Kenneth Bautista MRN: [id number] Date of Birth: [birth year]  Today's Date: 02/22/2021 Session I: OT Individual Time: 6378-5885 OT Individual Time Calculation (min): 75 min   Session II: Today's Date: 02/22/2021 OT Individual Time: 1000-1045 OT Individual Time Calculation (min): 45 min    Short Term Goals: Week 1:  OT Short Term Goal 1 (Week 1): Pt will complete UB bathing with setup OT Short Term Goal 1 - Progress (Week 1): Met OT Short Term Goal 2 (Week 1): Pt will complete transfer to wc/BSC with mod A +2 OT Short Term Goal 2 - Progress (Week 1): Met OT Short Term Goal 3 (Week 1): Pt will direct care for B LE management during transfer OT Short Term Goal 3 - Progress (Week 1): Met OT Short Term Goal 4 (Week 1): Pt will complete UB dressing with (S) OT Short Term Goal 4 - Progress (Week 1): Met Week 2:  OT Short Term Goal 1 (Week 2): STG=LTG  Skilled Therapeutic Interventions/Progress Updates:  Session I: Pt received asleep in bed, easily awoken, and agreeable to OT session focusing on transfers for independent self-care activities upon d/c + showering and dressing tasks. Reported 8/10 pain in L shoulder while in shower. RN checked in later - pt reported pain at a 6/10.   ADL: Pt completes BADL at overall close (S) with verbal cuing for WB precautions. Skilled interventions include:  Pt agreed to shower using tilted rolling shower chair. Transferred bed > rolling shower chair with close (S). Pt able to direct care, but still needs frequent cuing to maintain RUE WB precautions. Responds often to subtle cuing. Pt completed showering with close (S) and was educated on use of long-handled sponge. Pt reported being extremely cold in shower, and was shivering excessively - he reported that was normal at baseline. Pt completed UB dressing with supervision, and needed min A for management of BUE for distal management of pants/leg brace.  Used normal BSC to void urine, AP transfer completed with close (S). Nursing student present for part of tx to observe with pt permission. Pt left at end of session in recliner with exit alarm belt on, call light in reach and all needs met.    Session II - Family/Caregiver Education:  Pt received in recliner with caregiver [name] and mom present for family ed. Mom was present, but observing. [name] participated in hands-on education and practice of techniques facilitated by OT/OTS. No c/o pain from pt. Pt and family/caregiver made aware of session purpose and role of OT. Handouts provided on alcohol consumption after TBI and memory. Family/caregiver educated on wc positioning & safety, WB precautions, proactive ways to assist pt during transfers, and pain management strategies. Pt requested to use BSC and pt completed AP transfer first with OTS demonstrating technique, guided by pt, and caregiver returned demonstrated correctly. All verbalized understanding. Discussed current level of ADL function and problem-solved navigating potential community outings. Pt given education on allowing caregiver more time to complete tasks and for caregiver to advocate for needs if feeling anxious around mobility. Pt left upright in wc, needs met, and call bell nearby with family/caregiver present.   Therapy Documentation Precautions:  Precautions Precautions: Fall, Other (comment) Precaution Comments: RLE bledsoe brace locked in ext Required Braces or Orthoses: Sling, Other Brace Knee Immobilizer - Right: On at all times Restrictions Weight Bearing Restrictions: Yes RUE Weight Bearing: Non weight bearing LUE Weight Bearing: Partial weight bearing RLE Weight  Bearing: Non weight bearing LLE Weight Bearing: Non weight bearing Other Position/Activity Restrictions: LUE non-op acromion fx: minimal weightbearing; will be safest to weightbear/push with arm down at side vs reaching or pulling with left shoulder flexed or  abducted. Can do gentle PROM/AROM as tolerated.   Therapy/Group: Individual Therapy  Tazia Illescas 02/22/2021, 6:57 AM

## 2021-02-22 NOTE — Progress Notes (Addendum)
Physical Therapy TBI Note  Patient Details  Name: Kenneth Bautista MRN: [id number] Date of Birth: 11-13-1985  Today's Date: 02/21/2021 PT Individual Time: 1515-1530 PT Individual Time Calculation (min): 15 min  Short Term Goals: Week 2:  PT Short Term Goal 1 (Week 2): STG=LTG due to ELOS  Skilled Therapeutic Interventions/Progress Updates:     Patient in recliner in the room upon PT arrival. Patient reported 4/10 B lower extremity pain during session, RN made aware. PT provided repositioning, rest breaks, and distraction as pain interventions throughout session. Patient reports that pain is "best it's been all day," and requests to remain in the chair at this time.   Noted B PROFOs with poor positioning, repositioned with increased time effort due to poor fit. Will reach out to MD about alternative orthotics, DF Adjustable Night Splints vs smaller PRAFO, to improve DF ROM, maintain skin integrity, minimize readjustment.   Discussed d/c planning, patient reports his friend will be here tomorrow for education and the hospital bed will be delivered on Monday. Patient reports confidence in going home and good progress during therapies.   Patient in recliner in the room at end of session with breaks locked, chair alarm set, and all needs in reach. Patient missed 15 min of skilled PT due to fatigue, RN made aware. Will attempt to make-up missed time as able.     Therapy Documentation Precautions:  Precautions Precautions: Fall, Other (comment) Precaution Comments: RLE bledsoe brace locked in ext Required Braces or Orthoses: Sling, Other Brace Knee Immobilizer - Right: On at all times Restrictions Weight Bearing Restrictions: Yes RUE Weight Bearing: Non weight bearing LUE Weight Bearing: Partial weight bearing RLE Weight Bearing: Non weight bearing LLE Weight Bearing: Non weight bearing Other Position/Activity Restrictions: LUE non-op acromion fx: minimal weightbearing; will be safest to  weightbear/push with arm down at side vs reaching or pulling with left shoulder flexed or abducted. Can do gentle PROM/AROM as tolerated. Agitated Behavior Scale: TBI Observation Details Observation Environment: Pt's room Start of observation period - Date: 02/21/21 Start of observation period - Time: 1515 End of observation period - Date: 02/21/21 End of observation period - Time: 1530 Agitated Behavior Scale (DO NOT LEAVE BLANKS) Short attention span, easy distractibility, inability to concentrate: Present to a slight degree Impulsive, impatient, low tolerance for pain or frustration: Present to a slight degree Uncooperative, resistant to care, demanding: Absent Violent and/or threatening violence toward people or property: Absent Explosive and/or unpredictable anger: Absent Rocking, rubbing, moaning, or other self-stimulating behavior: Absent Pulling at tubes, restraints, etc.: Absent Wandering from treatment areas: Absent Restlessness, pacing, excessive movement: Absent Repetitive behaviors, motor, and/or verbal: Absent Rapid, loud, or excessive talking: Absent Sudden changes of mood: Absent Easily initiated or excessive crying and/or laughter: Absent Self-abusiveness, physical and/or verbal: Absent Agitated behavior scale total score: 16    Therapy/Group: Individual Therapy  [name] L Raven Furnas PT, DPT  02/21/2021, 6:20 PM

## 2021-02-22 NOTE — Progress Notes (Signed)
Physical Therapy TBI Note  Patient Details  Name: Kenneth Bautista MRN: [id number] Date of Birth: 03/12/1986  Today's Date: 02/22/2021 PT Individual Time: 1045-1130 PT Individual Time Calculation (min): 45 min   Short Term Goals: Week 2:  PT Short Term Goal 1 (Week 2): STG=LTG due to ELOS  Skilled Therapeutic Interventions/Progress Updates:     Patient in w/c with his friend/caregiver and mother present for hands on training upon PT arrival. Patient alert and agreeable to PT session. Patient reported 4-6/10 B lower extremity and R elbow pain during session, RN made aware. PT provided repositioning, rest breaks, and distraction as pain interventions throughout session.   Patient and caregiver report that OT reviewed bed mobility, bed<>recliner transfers, bed<>BSC transfers, and bed>w/c transfer. Focused session on w/c parts management, car transfer, review of precautions and R LE protocol, and review of Bledsoe brace and PRAFO placement and adjustment. Patient's caregiver performed all mobility and brace management with min cues for set-up and frequent cuing for patient to maintain NWB with R upper extremity, slow down, and communicate his needs due to impulsivity and poor frustration/pain tolerance.   Therapeutic Activity: Transfers: Patient performed a simulated sedan height car transfer with min A-CGA using P/A transfer technique with a slide board.  Patient performed transfer w/c>bed>recliner with min A-CGA using P/A transfer technique with a slide board. Required mod A-total A for board management and utilized gait belt as a leg lifter to manage his R leg during transfers.   Educated on ascending and descending the ramp backwards for improved control and reduced back strain for the caregiver. Recommended having a second person in front of the wheelchair for the first trials for safety. Also recommended limiting outings to doctors appointments and necessities initially to establish a home  routine, conserve energy, and reduce risk of falls or injury. Patient reluctant to this, but caregivers stated understanding.   Educated patient on fall risk/prevention, home modifications to prevent falls, and activation of emergency services in the event of a fall during session. Patient and caregivers denied any further questions or concerns at this time and report feeling prepared for d/c.  Patient in recliner in the room handed off to SLP for continued caregiver education at end of session.   Therapy Documentation Precautions:  Precautions Precautions: Fall, Other (comment) Precaution Comments: RLE bledsoe brace locked in ext Required Braces or Orthoses: Sling, Other Brace Knee Immobilizer - Right: On at all times Restrictions Weight Bearing Restrictions: Yes RUE Weight Bearing: Non weight bearing LUE Weight Bearing: Partial weight bearing RLE Weight Bearing: Non weight bearing LLE Weight Bearing: Non weight bearing Other Position/Activity Restrictions: LUE non-op acromion fx: minimal weightbearing; will be safest to weightbear/push with arm down at side vs reaching or pulling with left shoulder flexed or abducted. Can do gentle PROM/AROM as tolerated.  Agitated Behavior Scale: TBI Observation Details Observation Environment: Patient's room Start of observation period - Date: 02/22/21 Start of observation period - Time: 1045 End of observation period - Date: 02/22/21 End of observation period - Time: 1130 Agitated Behavior Scale (DO NOT LEAVE BLANKS) Short attention span, easy distractibility, inability to concentrate: Present to a slight degree Impulsive, impatient, low tolerance for pain or frustration: Present to a slight degree Uncooperative, resistant to care, demanding: Absent Violent and/or threatening violence toward people or property: Absent Explosive and/or unpredictable anger: Absent Rocking, rubbing, moaning, or other self-stimulating behavior: Absent Pulling at  tubes, restraints, etc.: Absent Wandering from treatment areas: Absent Restlessness, pacing, excessive movement: Absent  Repetitive behaviors, motor, and/or verbal: Absent Rapid, loud, or excessive talking: Absent Sudden changes of mood: Absent Easily initiated or excessive crying and/or laughter: Absent Self-abusiveness, physical and/or verbal: Absent Agitated behavior scale total score: 16    Therapy/Group: Individual Therapy  Kolbi Altadonna L Renleigh Ouellet PT, DPT  02/22/2021, 4:09 PM

## 2021-02-23 ENCOUNTER — Encounter: Payer: Self-pay | Admitting: General Surgery

## 2021-02-23 MED ORDER — ACETAMINOPHEN 325 MG PO TABS
650.0000 mg | ORAL_TABLET | Freq: Four times a day (QID) | ORAL | Status: DC | PRN
  Administered 2021-02-25: 650 mg via ORAL
  Filled 2021-02-23: qty 2

## 2021-02-23 NOTE — Progress Notes (Addendum)
Speech Language Pathology Daily TBI Session Note  Patient Details  Name: Kenneth Bautista MRN: [id number] Date of Birth: Apr 05, 1986  Today's Date: 02/23/2021 SLP Individual Time: 0900-0930 SLP Individual Time Calculation (min): 30 min  Short Term Goals: Week 2: SLP Short Term Goal 1 (Week 2): ST=LTG due to ELOS  Skilled Therapeutic Interventions: Skilled treatment session focused on cognitive goals. SLP facilitated session by re-administering the [facility] Mental Status Examination (SLUMS). Patient scored  23/30 points with a score of 25 or above considered normal. Patient continues to demonstrate deficits in working memory. However, suspect function today was impacted by pain. Overall, patient demonstrates improved recall and carryover of functional information. Patient educated on results. Patient left upright in recliner with alarm on and all needs within reach. Continue with current plan of care.   Pain Pain Assessment Pain Scale: 0-10 Pain Score: 7  Pain Location: Leg Pain Orientation: Right Pain Descriptors / Indicators: Crushing Pain Intervention(s): Medication (See eMAR)  Agitated Behavior Scale: TBI Observation Details Observation Environment: Patient's room Start of observation period - Date: 02/23/21 Start of observation period - Time: 0900 End of observation period - Date: 02/23/21 End of observation period - Time: 0930 Agitated Behavior Scale (DO NOT LEAVE BLANKS) Short attention span, easy distractibility, inability to concentrate: Present to a slight degree Impulsive, impatient, low tolerance for pain or frustration: Present to a slight degree Uncooperative, resistant to care, demanding: Absent Violent and/or threatening violence toward people or property: Absent Explosive and/or unpredictable anger: Absent Rocking, rubbing, moaning, or other self-stimulating behavior: Absent Pulling at tubes, restraints, etc.: Absent Wandering from treatment areas:  Absent Restlessness, pacing, excessive movement: Absent Repetitive behaviors, motor, and/or verbal: Absent Rapid, loud, or excessive talking: Absent Sudden changes of mood: Absent Easily initiated or excessive crying and/or laughter: Absent Self-abusiveness, physical and/or verbal: Absent Agitated behavior scale total score: 16  Therapy/Group: Individual Therapy  Zakyla Tonche 02/23/2021, 12:39 PM

## 2021-02-23 NOTE — Progress Notes (Signed)
Speech Language Pathology Discharge Summary  Patient Details  Name: Kenneth Bautista MRN: [id number] Date of Birth: 1985/10/13  Today's Date: 02/25/2021 SLP Individual Time: 9390-3009 SLP Individual Time Calculation (min): 44 min   Skilled Therapeutic Interventions:  Pt seen for skilled ST with focus on cognitive goals and discharge education, pt upright in recliner and agreeable to therapeutic tasks. Reviewed goals of ST and pt progress toward these goals as well as remaining cognitive deficits. Pt demonstrates adequate awareness of memory impairments and difficulty with complex problem solving as a result of TBI. SLP providing handout for memory strategies as well as brain health, pt verbalized understanding. Pt agreeable to supervision with all higher level cognitive tasks, states ex-girlfriend will be living with him and assisting. Pt very appreciative of therapy staff, requesting names of everyone he has worked with which SLP provided in [employer]. Pt left in recliner with alarm set and all needs within reach.   Patient has met 5 of 5 long term goals.  Patient to discharge at overall Supervision level.   Reasons goals not met: N/A   Clinical Impression/Discharge Summary: Patient has made functional gains and has met 5 of 5 LTGs this admission. Currently, patient demonstrates behaviors consistent with a [place] Level VIII and requires overall supervision level verbal cues to complete functional and mildly complex tasks safely in regards to problem solving, recall with use of strategies, attention and awareness. Patient and family education is complete and patient will discharge home with 24 hour supervision from family/friends. Patient would benefit from f/u home health SLP services to maximize his cognitive functioning and overall functional independence.   Care Partner:  Caregiver Able to Provide Assistance: Yes  Type of Caregiver Assistance: Physical;Cognitive  Recommendation:  24  hour supervision/assistance;Home Health SLP  Rationale for SLP Follow Up: Reduce caregiver burden;Maximize cognitive function and independence   Equipment: N/A   Reasons for discharge: Discharged from hospital;Treatment goals met   Patient/Family Agrees with Progress Made and Goals Achieved: Yes    PAYNE, [place] 02/23/2021, 6:30 AM
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
## 2021-02-23 NOTE — Progress Notes (Signed)
Physical Therapy TBI Note  Patient Details  Name: Kenneth Bautista MRN: [id number] Date of Birth: 02/24/1986  Today's Date: 02/23/2021 PT Individual Time: 1005-1050 PT Individual Time Calculation (min): 45 min  and Today's Date: 02/23/2021 PT Missed Time: 15 Minutes Missed Time Reason: Pain  Short Term Goals: Week 2:  PT Short Term Goal 1 (Week 2): STG=LTG due to ELOS  Skilled Therapeutic Interventions/Progress Updates:     Pt received seated in recliner and agrees to therapy, through expresses that he may not be able to do much due to increased pain in R knee/leg.  Pt says that he "twisted knee" twice this past night, causing in the increase in pain. PT provides rest breaks as needed, repositioning, and ice following session to manage pain. Pt performs slideboard transfer from recliner to [facility] with minA. PT cues for sequencing and pt uses bed to prop legs up while he performs transfer. WC transport to gym for time management. Pt performs AP transfer to mat table with CGA and cues to not use right upper extremity due to NWB status, and pt impulsively attempting to use R arm throughout transfer. Pt then performs seated scoot from middle of mat to edge with feet hanging off the end. PT provides verbal and tactile cues for correct performance and muscle activation for quad sets in long sitting. Pt completes x10 but by the end is crying due to being in so much pain in R leg. Pt requesting to defer remainder of session due to pain. AP transfer from mat to [facility] with minA and cues for sequencing. Pt then performs slideboard transfer to recliner with minA and cues for positioning and weight shifting. PT educates pt on use of ice for 20 minutes on and 20 minutes off. Pt legs elevated with PRAFO boots in place and ice pack on R knee. All needs within reach.  Therapy Documentation Precautions:  Precautions Precautions: Fall, Other (comment) Precaution Comments: RLE bledsoe brace locked in ext Required Braces or  Orthoses: Sling, Other Brace Knee Immobilizer - Right: On at all times Restrictions Weight Bearing Restrictions: Yes RUE Weight Bearing: Non weight bearing LUE Weight Bearing: Partial weight bearing RLE Weight Bearing: Non weight bearing LLE Weight Bearing: Non weight bearing Other Position/Activity Restrictions: LUE non-op acromion fx: minimal weightbearing; will be safest to weightbear/push with arm down at side vs reaching or pulling with left shoulder flexed or abducted. Can do gentle PROM/AROM as tolerated. Agitated Behavior Scale: TBI Observation Details Observation Environment: pt room Start of observation period - Date: 02/23/21 Start of observation period - Time: 0700 End of observation period - Date: 02/23/21 End of observation period - Time: 0800 Agitated Behavior Scale (DO NOT LEAVE BLANKS) Short attention span, easy distractibility, inability to concentrate: Present to a slight degree Impulsive, impatient, low tolerance for pain or frustration: Present to a moderate degree Uncooperative, resistant to care, demanding: Absent Violent and/or threatening violence toward people or property: Absent Explosive and/or unpredictable anger: Absent Rocking, rubbing, moaning, or other self-stimulating behavior: Absent Pulling at tubes, restraints, etc.: Absent Wandering from treatment areas: Absent Restlessness, pacing, excessive movement: Present to a slight degree Repetitive behaviors, motor, and/or verbal: Present to a slight degree Rapid, loud, or excessive talking: Present to a slight degree Sudden changes of mood: Present to a slight degree Easily initiated or excessive crying and/or laughter: Present to a slight degree Self-abusiveness, physical and/or verbal: Absent Agitated behavior scale total score: 22   Therapy/Group: Individual Therapy  Beau Fanny, PT,  DPT 02/23/2021, 10:58 AM

## 2021-02-23 NOTE — Progress Notes (Signed)
Physical Therapy TBI Note  Patient Details  Name: Kenneth Bautista MRN: [id number] Date of Birth: 1986-05-06  Today's Date: 02/23/2021 PT Individual Time: 1330-1430 PT Individual Time Calculation (min): 60 min   Short Term Goals: Week 2:  PT Short Term Goal 1 (Week 2): STG=LTG due to ELOS  Skilled Therapeutic Interventions/Progress Updates:     Patient in recliner in the room upon PT arrival. Patient alert and agreeable to PT session. Patient reported 7-8/10 R knee pain and 4/10 L lower extremity pain during session, RN made aware. PT provided repositioning, rest breaks, and distraction as pain interventions throughout session. Patient reports that he "twisted" his R knee when transferring on/off the BSC last night and in the bed and has had significant R knee pain today. Noted increased edema and education on 20 min on/off ice application for edema control.  Educated patient on vestibular treatment in hopes to continue to improve symptoms of dizziness with mobility. Patient declined treatment today due to increased R knee pain and reports minimal symptoms over the past several days.   Patient agreeable to practicing HEP, but only with his L leg due to increased R knee pain, reports being in tears earlier when attempting exercises on R.  Therapeutic Exercise: Patient performed 2 sets on the L of the following exercises with verbal and tactile cues for proper technique from updated HEP handout provided during session. Access Code: [email or web address]  Long Sitting 4 Way Patellar Glide - 2 x daily - 7 x weekly - 3 sets - 10 reps Quad set with towel Roll - 2 x daily - 7 x weekly - 3 sets - 10 reps Active Straight Leg Raise with Quad Set - 2 x daily - 7 x weekly - 2 sets - 10 reps Seated Long Arc Quad - 2 x daily - 7 x weekly - 3 sets - 10 reps Patient required increased time for exercises due to poor pain tolerance. Provided cues for diaphragmatic breathing and education on benefits of exercises  throughout.   Reviewed surgical protocol for R lower extremity and instructed not to progress until cleared by MD and initiation of follow-up PT at the time he is cleared for weight bearing (~6 weeks from surgery).   Reviewed unlocking and locking Bledsoe brace for ROM and goals for 0-90 deg knee ROM by 6 weeks. Patient unable to tolerate ROM today, but agreeable to work on this on Sunday prior to d/c. Patient has been resistant to ROM of R knee due to pain intolerance and anxiety about pain and injury with mobility. Educated patient on safety of ROM to recommended range and risk of long term loss of ROM without early intervention. Patient stated understanding. Called [name], his caregiver, and requested she come for education on HEP and ROM assistance at d/c on Sunday, [name] in agreement and scheduled to come at 11am Sunday. Emphasized importance of HEP every day due to no follow-up therapy until weight bearing, patient stated understanding.  Patient in recliner in the room at end of session with breaks locked and all needs within reach.   Therapy Documentation Precautions:  Precautions Precautions: Fall, Other (comment) Precaution Comments: RLE bledsoe brace locked in ext Required Braces or Orthoses: Sling, Other Brace Knee Immobilizer - Right: On at all times Restrictions Weight Bearing Restrictions: Yes RUE Weight Bearing: Non weight bearing LUE Weight Bearing: Partial weight bearing RLE Weight Bearing: Non weight bearing LLE Weight Bearing: Non weight bearing Other Position/Activity Restrictions: LUE non-op acromion fx: minimal  weightbearing; will be safest to weightbear/push with arm down at side vs reaching or pulling with left shoulder flexed or abducted. Can do gentle PROM/AROM as tolerated. Agitated Behavior Scale: TBI Observation Details Observation Environment: pt room Start of observation period - Date: 12/23/20 Start of observation period - Time: 1330 End of observation period -  Date: 12/23/20 End of observation period - Time: 1430 Agitated Behavior Scale (DO NOT LEAVE BLANKS) Short attention span, easy distractibility, inability to concentrate: Present to a slight degree Impulsive, impatient, low tolerance for pain or frustration: Present to a moderate degree Uncooperative, resistant to care, demanding: Present to a slight degree Violent and/or threatening violence toward people or property: Absent Explosive and/or unpredictable anger: Absent Rocking, rubbing, moaning, or other self-stimulating behavior: Absent Pulling at tubes, restraints, etc.: Absent Wandering from treatment areas: Absent Restlessness, pacing, excessive movement: Absent Repetitive behaviors, motor, and/or verbal: Present to a slight degree Rapid, loud, or excessive talking: Present to a slight degree Sudden changes of mood: Absent Easily initiated or excessive crying and/or laughter: Absent Self-abusiveness, physical and/or verbal: Absent Agitated behavior scale total score: 20    Therapy/Group: Individual Therapy  Zadin Lange L Lillias Difrancesco PT, DPT  02/23/2021, 3:54 PM

## 2021-02-23 NOTE — Progress Notes (Signed)
Occupational Therapy Session Note  Patient Details  Name: Kenneth Bautista MRN: [id number] Date of Birth: Sep 29, 1985  Today's Date: 02/23/2021 OT Individual Time: 0700-0800 OT Individual Time Calculation (min): 60 min    Short Term Goals: Week 1:  OT Short Term Goal 1 (Week 1): Pt will complete UB bathing with setup OT Short Term Goal 1 - Progress (Week 1): Met OT Short Term Goal 2 (Week 1): Pt will complete transfer to wc/BSC with mod A +2 OT Short Term Goal 2 - Progress (Week 1): Met OT Short Term Goal 3 (Week 1): Pt will direct care for B LE management during transfer OT Short Term Goal 3 - Progress (Week 1): Met OT Short Term Goal 4 (Week 1): Pt will complete UB dressing with (S) OT Short Term Goal 4 - Progress (Week 1): Met Week 2:  OT Short Term Goal 1 (Week 2): STG=LTG  Skilled Therapeutic Interventions/Progress Updates:    Pt received in bed with 8 out of 10 pain in R leg. Reported that he "twisted" R knee last night. Repositioning and cold pack provided for pain relief. NT and RN made aware. Skin check normal pre-application. Pt stated that "I wish y'all would have let me die and cut my leg off rather than deal with this". Pt has no active plans at this time to hurt himself. Reported he has been crying on and off all night. Extremely emotionally labile during tx today. Educated on emotional swings post-TBI, depression and provided strategies to assist with psychosocial components. Pt denied any concerns about d/c home, and acknowledges that family is supportive and involved in recovery. Pt cried repeatedly during tx and experienced mood swings from being sad to talking about marrying male staff members.  Majority of session focused on providing empathetic listening to support pt, strategies to approach sleep disturbances, and OTS facilitated re-direction when pt perseverated on pain.   ADL: Pt completes BADL at overall close (S) level. Skilled interventions include: Pt requested to  wash up at bed level. Completed UB dressing and bathing with setup and OTS facilitated management of distal clothing/knee brace to don pants. Mild swelling noted in R knee. Brace was positioned at start of tx below knee joint, adjustments made to return to proper position. Pt reminded of brace placement. Completed AP transfer with close (S) to recliner. Pt gasped excessively in pain during transfer, required VC for following WB precautions on R UE. Pt left at end of session in recliner with call light in reach and all needs met.    Therapy Documentation Precautions:  Precautions Precautions: Fall, Other (comment) Precaution Comments: RLE bledsoe brace locked in ext Required Braces or Orthoses: Sling, Other Brace Knee Immobilizer - Right: On at all times Restrictions Weight Bearing Restrictions: Yes RUE Weight Bearing: Non weight bearing LUE Weight Bearing: Partial weight bearing RLE Weight Bearing: Non weight bearing LLE Weight Bearing: Non weight bearing Other Position/Activity Restrictions: LUE non-op acromion fx: minimal weightbearing; will be safest to weightbear/push with arm down at side vs reaching or pulling with left shoulder flexed or abducted. Can do gentle PROM/AROM as tolerated.    Therapy/Group: Individual Therapy  Lio Wehrly 02/23/2021, 6:52 AM

## 2021-02-24 NOTE — Progress Notes (Signed)
Orthopedic Tech Progress Note Patient Details:  Kenneth Bautista September 29, 1985 [id number]  Patient ID: [name], male   DOB: 1985/11/15, 35 y.o.   MRN: [id number] Called RN to get clarification on night splint with kick stand order as the pt already has prafo boots on.  Trinna Post 02/24/2021, 6:25 AM

## 2021-02-24 NOTE — Progress Notes (Signed)
Occupational Therapy Session Note  Patient Details  Name: Kenneth Bautista MRN: [id number] Date of Birth: 05-21-1986  Today's Date: 02/25/2021 OT Individual Time: 1345-1443 OT Individual Time Calculation (min): 58 min   Short Term Goals: Week 1:  OT Short Term Goal 1 (Week 1): Pt will complete UB bathing with setup OT Short Term Goal 1 - Progress (Week 1): Met OT Short Term Goal 2 (Week 1): Pt will complete transfer to wc/BSC with mod A +2 OT Short Term Goal 2 - Progress (Week 1): Met OT Short Term Goal 3 (Week 1): Pt will direct care for B LE management during transfer OT Short Term Goal 3 - Progress (Week 1): Met OT Short Term Goal 4 (Week 1): Pt will complete UB dressing with (S) OT Short Term Goal 4 - Progress (Week 1): Met  Skilled Therapeutic Interventions/Progress Updates:    Pt greeted in the recliner and premedicated for pain. His SO [name] was present. Pt was wearing new B leg supports, one over the Rt bledsoe brace. Pt completed AP transfer from recliner<bed<BSC and then reverse transfers back to the recliner. Pt required min cues for NWB with the Rt UE, [name] did well to cue pt about this. Pt able to maintain B LE NWB. Discussed sleep and holistic pain mgt strategies for carryover at home. Pt reports that he has trouble sleeping and also managing his pain at times. Education provided on using guided meditation videos, diaphragmatic breathing, aromatherapy, meaningful music listening, and therapeutic activities (encouraged leisure pursuits) to address. [name] adjusted his leg supports in order for pt to don new gripper socks. He reported the ones he had were too tight so OT brought him a few extras that were bigger. Per pt, these socks were a more comfortable fit. Discussed HEPs and d/c concerns, pt and SO at this time have no further questions pertaining to OT, feel ready for pts d/c home tomorrow. Pt remained in the recliner at close of session, all needs within reach.   Therapy  Documentation Precautions:  Precautions Precautions: Fall Precaution Comments: RLE bledsoe brace locked in ext, unlock for ROM only Required Braces or Orthoses: Other Brace Knee Immobilizer - Right: On at all times Restrictions Weight Bearing Restrictions: Yes RUE Weight Bearing: Non weight bearing LUE Weight Bearing: Weight bearing as tolerated RLE Weight Bearing: Non weight bearing LLE Weight Bearing: Non weight bearing Other Position/Activity Restrictions: Gentle PROM/AROM as tolerated, R LE knee ROM 0-90 x 6 weeks (see surgical protocol)  Pain: Pain Assessment Pain Score: 5  ADL: ADL Eating: Set up Grooming: Setup Where Assessed-Grooming: Bed level (bed level on BSC) Upper Body Bathing: Minimal assistance Where Assessed-Upper Body Bathing: Bed level (bed level on BSC) Lower Body Bathing: Dependent Where Assessed-Lower Body Bathing: Bed level (bed level on BSC) Upper Body Dressing: Minimal assistance Where Assessed-Upper Body Dressing: Bed level (bed level on BSC) Lower Body Dressing: Dependent Where Assessed-Lower Body Dressing: Bed level (bed level on BSC) Toileting: Dependent Where Assessed-Toileting: Bedside Commode (BSC with BLE on bed for support) Toilet Transfer: Maximal assistance, Other (comment) (max A x 2 (needs assistance with BLE)) Toilet Transfer Method: Other (comment) (AP transfer 2 people) Science writer: Extra wide bedside commode (bed used to prop legs) Tub/Shower Transfer: Unable to assess    Therapy/Group: Individual Therapy  Dinia Joynt A Kanesha Cadle 02/25/2021, 4:34 PM

## 2021-02-25 NOTE — Progress Notes (Addendum)
Physical Therapy TBI Note  Patient Details  Name: Kenneth Bautista MRN: [id number] Date of Birth: 06-17-1985  Today's Date: 02/25/2021 PT Individual Time: 1110-1215 PT Individual Time Calculation (min): 65 min   Short Term Goals: Week 2:  PT Short Term Goal 1 (Week 2): STG=LTG due to ELOS  Skilled Therapeutic Interventions/Progress Updates:     Patient in recliner with his caregiver, Rinaldo Cloud, in the room upon PT arrival. Patient alert and agreeable to PT session. Patient reported 6-8/10 lower extremity pain R>L during session, RN made aware. PT provided repositioning, rest breaks, and distraction as pain interventions throughout session.   Focused session on caregiver education for assist with HEP and importance of maintaining home program and progressing ROM until patient is cleared for weight bearing to progress with PT per surgical protocol.   Therapeutic Exercise: Patient performed 2 sets on the R/L of the following exercises with verbal and tactile cues for proper technique from HEP handout provided during session. Access Code: [email or web address]   Long Sitting 4 Way Patellar Glide - 2 x daily - 7 x weekly - 3 sets - 10 reps Quad set with towel Roll - 2 x daily - 7 x weekly - 3 sets - 10 reps Active Straight Leg Raise with Quad Set - 2 x daily - 7 x weekly - 2 sets - 10 reps Seated Long Arc Quad - 2 x daily - 7 x weekly - 3 sets - 10 reps Patient required increased time for exercises due to poor pain tolerance. Provided cues for diaphragmatic breathing and education on benefits of exercises throughout. Patient's caregiver performed assist to R lower extremity during exercises following demo from PT.  Set-up and educated on donning/doffing B DF Adjustable Night Splints. Recommending 2 hour on, 2 hour off scheduled focused on increased DF ROM over time with heel dropped down into the boot. Placed R splint over Bledsoe brace, patient denied discomfort, will check back to assess skin integrity with  new splints.   Reviewed placement, donning/doffing, and locking/unlocking Bledsoe brace. Patient's caregiver demonstrated how to do all of these with good technique. Emphasized only to unlock the brace for knee flexion ROM exercises and to have it locked at 0 degrees at all other times, patient and his caregiver stated understanding.   Patient in recliner with Rinaldo Cloud in the room at end of session with breaks locked and all needs within reach.   Therapy Documentation Precautions:  Precautions Precautions: Fall Precaution Comments: RLE bledsoe brace locked in ext, unlock for ROM only Required Braces or Orthoses: Other Brace Knee Immobilizer - Right: On at all times Restrictions Weight Bearing Restrictions: Yes RUE Weight Bearing: Non weight bearing LUE Weight Bearing: Weight bearing as tolerated RLE Weight Bearing: Non weight bearing LLE Weight Bearing: Non weight bearing Other Position/Activity Restrictions: Gentle PROM/AROM as tolerated, R LE knee ROM 0-90 x 6 weeks (see surgical protocol) Agitated Behavior Scale: TBI Observation Details Observation Environment: pt room Start of observation period - Date: 02/25/21 Start of observation period - Time: 1110 End of observation period - Date: 02/25/21 End of observation period - Time: 1215 Agitated Behavior Scale (DO NOT LEAVE BLANKS) Short attention span, easy distractibility, inability to concentrate: Present to a slight degree Impulsive, impatient, low tolerance for pain or frustration: Present to a slight degree Uncooperative, resistant to care, demanding: Absent Violent and/or threatening violence toward people or property: Absent Explosive and/or unpredictable anger: Absent Rocking, rubbing, moaning, or other self-stimulating behavior: Absent Pulling at tubes, restraints, etc.:  Absent Wandering from treatment areas: Absent Restlessness, pacing, excessive movement: Present to a slight degree Repetitive behaviors, motor, and/or  verbal: Present to a slight degree Rapid, loud, or excessive talking: Present to a slight degree Sudden changes of mood: Absent Easily initiated or excessive crying and/or laughter: Absent Self-abusiveness, physical and/or verbal: Absent Agitated behavior scale total score: 19   Therapy/Group: Individual Therapy  Harout Scheurich L Tedd Cottrill PT, DPT  02/25/2021, 12:52 PM

## 2021-02-25 NOTE — Progress Notes (Signed)
Occupational Therapy Discharge Summary  Patient Details  Name: Kenneth Bautista MRN: [id number] Date of Birth: November 02, 1985  Patient has met 8 of 8 long term goals due to improved activity tolerance, improved balance, postural control, ability to compensate for deficits, functional use of  RIGHT upper, RIGHT lower, LEFT upper, and LEFT lower extremity, improved attention, improved awareness, and improved coordination.  Pt has made excellent progress this admission and improved from total of 2 for Bed level BADLs to set up for transfers and up to MIN A for LB dressing. Pt family has participated in education and provided the physical/cognitive asisstance at DC. Patient to discharge at overall [place].  Patient's care partner is independent to provide the necessary physical assistance at discharge.    Reasons goals not met: n/a  Recommendation:  Patient will benefit from ongoing skilled OT services in outpatient setting to continue to advance functional skills in the area of BADL, iADL, and Vocation once WB status changes  Equipment: [facility]  Reasons for discharge: treatment goals met  Patient/family agrees with progress made and goals achieved: Yes  OT Discharge  ADL ADL Eating: Modified independent Grooming: Modified independent Where Assessed-Grooming: Bed level (bed level on BSC) Upper Body Bathing: Setup Where Assessed-Upper Body Bathing: Bed level (bed level on BSC) Lower Body Bathing: Minimal assistance Where Assessed-Lower Body Bathing: Bed level Upper Body Dressing: Setup Where Assessed-Upper Body Dressing: Bed level (bed level on BSC) Lower Body Dressing: Minimal assistance Where Assessed-Lower Body Dressing: Bed level (bed level on BSC) Toileting: Setup Where Assessed-Toileting: Bedside Commode (BSC with BLE on bed for support) Toilet Transfer: Directs care (Comment), Close supervision Toilet Transfer Method: Other (comment) (AP transfer 2 people) Community education officer: Extra wide bedside commode (bed used to prop legs) Tub/Shower Transfer: Unable to assess Cognition Overall Cognitive Status: Impaired/Different from baseline Arousal/Alertness: Awake/alert Year: 2022 Month: September Day of Week: Correct Attention: Sustained Sustained Attention: Impaired Sustained Attention Impairment: Verbal basic;Functional basic Memory: Impaired Memory Impairment: Retrieval deficit Immediate Memory Recall: Blue;Sock;Bed Memory Recall Sock: Without Cue Memory Recall Blue: Without Cue Memory Recall Bed: Without Cue Awareness: Impaired [employer] Scales of Cognitive Functioning: Purposeful/appropriate Precautions/Restrictions Precautions Precautions: Fall Precaution Comments: RLE bledsoe brace locked in ext, unlock for ROM only Required Braces or Orthoses: Other Brace Restrictions Weight Bearing Restrictions: Yes RUE Weight Bearing: Non weight bearing LUE Weight Bearing: Weight bearing as tolerated RLE Weight Bearing: Non weight bearing LLE Weight Bearing: Non weight bearing Other Position/Activity Restrictions: Gentle PROM/AROM as tolerated, R LE knee ROM 0-90 x 6 weeks (see surgical protocol) Pain Interference Pain Interference Pain Effect on Sleep: 2. Occasionally Pain Interference with Therapy Activities: 3. Frequently Pain Interference with Day-to-Day Activities: 3. Frequently Vision/Perception  Vision - History Ability to See in Adequate Light: 0 Adequate Perception Perception: Within Functional Limits Praxis Praxis: Intact  Cognition Overall Cognitive Status: Impaired/Different from baseline Arousal/Alertness: Awake/alert Orientation Level: Oriented X4 Attention: Sustained Sustained Attention: Impaired Sustained Attention Impairment: Verbal basic;Functional basic Memory: Impaired Memory Impairment: Retrieval deficit Awareness: Impaired Awareness Impairment: Emergent impairment Problem Solving: Impaired Problem Solving  Impairment: Functional basic Behaviors: Poor frustration tolerance;Impulsive;Lability Safety/Judgment: Appears intact [employer] Scales of Cognitive Functioning: Purposeful/appropriate Sensation Sensation Light Touch: Impaired Detail Peripheral sensation comments: decreased sensation with numbness tingling of B lower extremities, improved since admission, but still present Light Touch Impaired Details: Impaired RLE;Impaired LLE Proprioception: Appears Intact Coordination Gross Motor Movements are Fluid and Coordinated: No Fine Motor Movements are Fluid and Coordinated: No Coordination  and Movement Description: Poly truama with significant weight bearing precautions in all extremities Motor  Motor Motor: Abnormal postural alignment and control  Mobility Bed Mobility Bed Mobility: Rolling Right;Rolling Left;Sit to Supine;Supine to Sit Rolling Right: Supervision/verbal cueing (with hospital bed features and leg lifter) Rolling Left: Supervision/Verbal cueing (with hospital bed features and leg lifter) Supine to Sit: Supervision/Verbal cueing (with hospital bed features and leg lifter) Sit to Supine: Supervision/Verbal cueing (with hospital bed features and leg lifter) Transfers Transfers: Anterior-Posterior Transfer Anterior-Posterior Transfer: ;Supervision/Verbal cueing Transfer (Assistive device): Other (Comment) (slide board) Trunk/Postural Assessment  Cervical Assessment Cervical Assessment: Exceptions to [facility] (mild forward head) Thoracic Assessment Thoracic Assessment: Exceptions to [facility] (rounded shoulders) Lumbar Assessment Lumbar Assessment: Exceptions to [facility] (posterior pelvic tilt) Postural Control Postural Control: Deficits on evaluation (decreased)  Balance Balance Balance Assessed: Yes Static Sitting Balance Static Sitting - Balance Support: No upper extremity supported Static Sitting - Level of Assistance: 5: Stand by assistance Dynamic Sitting  Balance Dynamic Sitting - Balance Support: No upper extremity supported Dynamic Sitting - Level of Assistance: 5: Stand by assistance Sitting balance - Comments: sitting balance in long sitting Extremity Assessment  RLE Assessment RLE Assessment: Exceptions to [facility] Passive Range of Motion (PROM) Comments: Ankle lacking 10 deg DF, knee lacking 5-10 deg extension and flexion to 25-30 deg, limited by pain and edema, provided heavy encouragement for progressing with knee flexion and extension exercises at d/c General Strength Comments: Grossly 2/5 for SLR, ankle grossly at least 3/5 LLE Assessment LLE Assessment: Exceptions to [facility] Passive Range of Motion (PROM) Comments: Knee lacking 5-10 degrees from neutral extension and flexion limited to 65 deg in sitting, DF lacking 5-10 deg from neutral General Strength Comments: Grossly 3/5 with SLR and knee flexion in supine, ankle 3/5    Tonny Branch 02/26/2021, 4:35 PM
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
## 2021-02-25 NOTE — Progress Notes (Signed)
Physical Therapy Discharge Summary  Patient Details  Name: Kenneth Bautista MRN: [id number] Date of Birth: 09-Dec-1985  Today's Date: 02/25/2021   Patient has met 4 of 5 long term goals due to improved activity tolerance, improved balance, improved postural control, increased strength, increased range of motion, decreased pain, ability to compensate for deficits, improved attention, and improved awareness.  Patient to discharge at a wheelchair level Supervision.   Patient's care partner is independent to provide the necessary physical and cognitive assistance at discharge.  Reasons goals not met: Patient continues to need CGA for safety with transfers due to impulsivity. Patient has secured a full time caregiver and she has participated in hands on training and is agreeable to provide this level of assist at d/c.  Recommendation:  Patient will benefit from ongoing skilled PT services in outpatient setting when cleared for weight bearing to continue to advance safe functional mobility, address ongoing impairments in balance, strength, ROM, activity tolerance, functional mobility, gait and stair training, patient/caregiver education, and minimize fall risk.  Equipment: 16"x16" w/c, slide board, RW   Reasons for discharge: treatment goals met  Patient/family agrees with progress made and goals achieved: Yes  PT Discharge Precautions/Restrictions Precautions Precautions: Fall Precaution Comments: RLE bledsoe brace locked in ext, unlock for ROM only Required Braces or Orthoses: Other Brace Restrictions Weight Bearing Restrictions: Yes RUE Weight Bearing: Non weight bearing LUE Weight Bearing: Weight bearing as tolerated RLE Weight Bearing: Non weight bearing LLE Weight Bearing: Non weight bearing Other Position/Activity Restrictions: Gentle PROM/AROM as tolerated, R LE knee ROM 0-90 x 6 weeks (see surgical protocol) Pain Interference Pain Interference Pain Effect on Sleep: 2.  Occasionally Pain Interference with Therapy Activities: 3. Frequently Pain Interference with Day-to-Day Activities: 3. Frequently Vision/Perception  Vision - History Ability to See in Adequate Light: 0 Adequate Perception Perception: Within Functional Limits Praxis Praxis: Intact  Cognition Overall Cognitive Status: Impaired/Different from baseline Arousal/Alertness: Awake/alert Orientation Level: Oriented X4 Attention: Sustained Sustained Attention: Impaired Sustained Attention Impairment: Verbal basic;Functional basic Memory: Impaired Memory Impairment: Retrieval deficit Awareness: Impaired Awareness Impairment: Emergent impairment Problem Solving: Impaired Problem Solving Impairment: Functional basic Behaviors: Poor frustration tolerance;Impulsive;Lability Safety/Judgment: Appears intact [employer] Scales of Cognitive Functioning: Purposeful/appropriate Sensation Sensation Light Touch: Impaired Detail Peripheral sensation comments: decreased sensation with numbness tingling of B lower extremities, improved since admission, but still present Light Touch Impaired Details: Impaired RLE;Impaired LLE Proprioception: Appears Intact Coordination Gross Motor Movements are Fluid and Coordinated: No Fine Motor Movements are Fluid and Coordinated: No Coordination and Movement Description: Poly truama with significant weight bearing precautions in all extremities Motor  Motor Motor: Abnormal postural alignment and control  Mobility Bed Mobility Bed Mobility: Rolling Right;Rolling Left;Sit to Supine;Supine to Sit Rolling Right: Supervision/verbal cueing (with hospital bed features and leg lifter) Rolling Left: Supervision/Verbal cueing (with hospital bed features and leg lifter) Supine to Sit: Supervision/Verbal cueing (with hospital bed features and leg lifter) Sit to Supine: Supervision/Verbal cueing (with hospital bed features and leg lifter) Transfers Transfers:  Anterior-Posterior Transfer Anterior-Posterior Transfer: Contact Guard/Touching assist;Supervision/Verbal cueing Transfer (Assistive device): Other (Comment) (slide board) Locomotion  Gait Ambulation: No Gait Gait: No Stairs / Additional Locomotion Stairs: No Wheelchair Mobility Wheelchair Mobility: No (weight bearing limitations)  Trunk/Postural Assessment  Cervical Assessment Cervical Assessment: Exceptions to [facility] (mild forward head) Thoracic Assessment Thoracic Assessment: Exceptions to [facility] (rounded shoulders) Lumbar Assessment Lumbar Assessment: Exceptions to [facility] (posterior pelvic tilt) Postural Control Postural Control: Deficits on evaluation (decreased)  Balance Balance Balance Assessed:  Yes Static Sitting Balance Static Sitting - Balance Support: No upper extremity supported Static Sitting - Level of Assistance: 5: Stand by assistance Dynamic Sitting Balance Dynamic Sitting - Balance Support: No upper extremity supported Dynamic Sitting - Level of Assistance: 5: Stand by assistance Sitting balance - Comments: sitting balance in long sitting Extremity Assessment  RLE Assessment RLE Assessment: Exceptions to [facility] Passive Range of Motion (PROM) Comments: Ankle lacking 10 deg DF, knee lacking 5-10 deg extension and flexion to 25-30 deg, limited by pain and edema, provided heavy encouragement for progressing with knee flexion and extension exercises at d/c General Strength Comments: Grossly 2/5 for SLR, ankle grossly at least 3/5 LLE Assessment LLE Assessment: Exceptions to [facility] Passive Range of Motion (PROM) Comments: Knee lacking 5-10 degrees from neutral extension and flexion limited to 65 deg in sitting, DF lacking 5-10 deg from neutral General Strength Comments: Grossly 3/5 with SLR and knee flexion in supine, ankle 3/5    Kenneth Bautista L Kenneth Bautista PT, DPT  02/25/2021, 12:39 PM

## 2021-02-25 NOTE — Plan of Care (Signed)
  Problem: RH Expression Communication Goal: LTG Patient will increase word finding of common (SLP) Description: LTG:  Patient will increase word finding of common objects/daily info/abstract thoughts with cues using compensatory strategies (SLP). Outcome: Completed/Met   Problem: RH Problem Solving Goal: LTG Patient will demonstrate problem solving for (SLP) Description: LTG:  Patient will demonstrate problem solving for basic/complex daily situations with cues  (SLP) Outcome: Completed/Met   Problem: RH Memory Goal: LTG Patient will use memory compensatory aids to (SLP) Description: LTG:  Patient will use memory compensatory aids to recall biographical/new, daily complex information with cues (SLP) Outcome: Completed/Met   Problem: RH Attention Goal: LTG Patient will demonstrate this level of attention during functional activites (SLP) Description: LTG:  Patient will will demonstrate this level of attention during functional activites (SLP) Outcome: Completed/Met   Problem: RH Awareness Goal: LTG: Patient will demonstrate awareness during functional activites type of (SLP) Description: LTG: Patient will demonstrate awareness during functional activites type of (SLP) Outcome: Completed/Met

## 2021-02-26 ENCOUNTER — Other Ambulatory Visit (HOSPITAL_COMMUNITY): Payer: Self-pay

## 2021-02-26 MED ORDER — TRAZODONE HCL 50 MG PO TABS
50.0000 mg | ORAL_TABLET | Freq: Every day | ORAL | 0 refills | Status: DC
  Filled 2021-02-26: qty 30, 30d supply, fill #0

## 2021-02-26 MED ORDER — VITAMIN D3 25 MCG PO TABS
1000.0000 [IU] | ORAL_TABLET | Freq: Every day | ORAL | 0 refills | Status: DC
  Filled 2021-02-26: qty 30, 30d supply, fill #0

## 2021-02-26 MED ORDER — METHOCARBAMOL 750 MG PO TABS
750.0000 mg | ORAL_TABLET | Freq: Three times a day (TID) | ORAL | 0 refills | Status: DC
  Filled 2021-02-26: qty 90, 30d supply, fill #0

## 2021-02-26 MED ORDER — PANTOPRAZOLE SODIUM 40 MG PO TBEC
40.0000 mg | DELAYED_RELEASE_TABLET | Freq: Every day | ORAL | 0 refills | Status: DC
  Filled 2021-02-26: qty 30, 30d supply, fill #0

## 2021-02-26 MED ORDER — GABAPENTIN 100 MG PO CAPS
200.0000 mg | ORAL_CAPSULE | Freq: Three times a day (TID) | ORAL | 0 refills | Status: DC
  Filled 2021-02-26: qty 180, 30d supply, fill #0

## 2021-02-26 MED ORDER — OXYCODONE HCL 5 MG PO TABS
5.0000 mg | ORAL_TABLET | ORAL | 0 refills | Status: DC | PRN
  Filled 2021-02-26: qty 30, 3d supply, fill #0

## 2021-02-26 MED ORDER — NICOTINE 14 MG/24HR TD PT24
MEDICATED_PATCH | TRANSDERMAL | 0 refills | Status: DC
  Filled 2021-02-26: qty 14, 14d supply, fill #0

## 2021-02-26 MED ORDER — NICOTINE 7 MG/24HR TD PT24: MEDICATED_PATCH | TRANSDERMAL | 0 refills | Status: DC

## 2021-02-26 MED ORDER — ENOXAPARIN SODIUM 40 MG/0.4ML IJ SOSY
40.0000 mg | PREFILLED_SYRINGE | INTRAMUSCULAR | 0 refills | Status: DC
Start: 2021-02-27 — End: 2021-04-20
  Filled 2021-02-26: qty 2.4, 6d supply, fill #0

## 2021-02-26 MED ORDER — ALBUTEROL SULFATE HFA 108 (90 BASE) MCG/ACT IN AERS
2.0000 | INHALATION_SPRAY | Freq: Four times a day (QID) | RESPIRATORY_TRACT | 0 refills | Status: DC | PRN
  Filled 2021-02-26: qty 8.5, 25d supply, fill #0

## 2021-02-26 MED ORDER — ADULT MULTIVITAMIN W/MINERALS CH
1.0000 | ORAL_TABLET | Freq: Every day | ORAL | Status: DC
Start: 2021-02-26 — End: 2021-07-31

## 2021-02-26 MED ORDER — HYDROXYZINE HCL 25 MG PO TABS
25.0000 mg | ORAL_TABLET | Freq: Three times a day (TID) | ORAL | 0 refills | Status: DC | PRN
  Filled 2021-02-26: qty 10, 4d supply, fill #0

## 2021-02-26 MED ORDER — ENOXAPARIN SODIUM 30 MG/0.3ML IJ SOSY
PREFILLED_SYRINGE | INTRAMUSCULAR | 0 refills | Status: DC
  Filled 2021-02-26: qty 4.2, 14d supply, fill #0

## 2021-02-26 MED ORDER — DOCUSATE SODIUM 100 MG PO CAPS: 100.0000 mg | ORAL_CAPSULE | Freq: Two times a day (BID) | ORAL | 0 refills | Status: DC
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
## 2021-02-26 NOTE — Progress Notes (Signed)
Patient educated for discharge by PA. Family is at bedside. Pt educated on Lovenox administration. Pt ready for discharge and awaiting medications from pharmacy.
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
## 2021-02-27 ENCOUNTER — Telehealth: Payer: Self-pay

## 2021-02-27 NOTE — Telephone Encounter (Signed)
Transition Care Management Follow-up Telephone Call Date of discharge and from where: 02/26/2021, [facility] How have you been since you were released from the hospital? He said he is feeling good, trying to adjust to being home with all of his physical limitations.  Any questions or concerns? Yes - he is concerned about moving his bowels but has not picked up the colace yet  Items Reviewed: Did the pt receive and understand the discharge instructions provided?  He said that his mother has the instructions and manages his appointments and medications.  Medications obtained and verified? Yes  - he said his mother manages his medications. He has not administered lovenox since returning home and it needs to be given daily starting today. He said his girlfriend is scared to give the injection but there is a nurse who lives in the same building as patient and she will work with his girlfriend to administer the medication.  Other? No  Any new allergies since your discharge? No  Do you have support at home? Yes  - his girlfriend lives with him and his mother is in an out checking on him.   Home Care and Equipment/Supplies: Were home health services ordered? no If so, what is the name of the agency? N/a  Has the agency set up a time to come to the patient's home? not applicable Were any new equipment or medical supplies ordered?  Yes: hosptial bed, sliding board, BSC, wheelchair and RW What is the name of the medical supply agency? [employer] Were you able to get the supplies/equipment? yes Do you have any questions related to the use of the equipment or supplies? No  Functional Questionnaire: (I = Independent and D = Dependent) ADLs: D - currently only transferring bed to chair with sliding board and assistance.  He is not to weight bear or use RW until cleared by doctor and therapy   Bathing/Dressing- D  Meal Prep- D  Eating- I  Maintaining continence-  D  Transferring/Ambulation- D  Managing Meds- D  Follow up appointments reviewed:  PCP Hospital f/u appt confirmed? Yes  Scheduled to see Dr Andrey Campanile on 03/20/2021 @ 1040. Specialist Hospital f/u appt confirmed? Yes  Scheduled to see PMR on 03/14/2021. His mother needs to schedule appointments with neurosurgery, orthopedic surgery and ENT Are transportation arrangements needed? No  - he said that his brother will drive him to the appointments and his mother and girlfriend will meet him at his destination and assist him out of the vehicle. His mother will be present for his appointments to make sure that he does not miss any instructions due to his short term memory loss.    If their condition worsens, is the pt aware to call PCP or go to the Emergency Dept.? Yes Was the patient provided with contact information for the PCP's office or ED? Yes Was to pt encouraged to call back with questions or concerns? Yes

## 2021-03-01 ENCOUNTER — Other Ambulatory Visit (HOSPITAL_COMMUNITY): Payer: Self-pay

## 2021-03-12 ENCOUNTER — Other Ambulatory Visit (HOSPITAL_COMMUNITY): Payer: Self-pay

## 2021-03-14 ENCOUNTER — Encounter: Payer: 59 | Admitting: Registered Nurse

## 2021-03-14 ENCOUNTER — Other Ambulatory Visit: Payer: Self-pay

## 2021-03-14 ENCOUNTER — Encounter: Payer: Self-pay | Admitting: Physical Medicine & Rehabilitation

## 2021-03-14 ENCOUNTER — Encounter: Payer: 59 | Attending: Registered Nurse | Admitting: Physical Medicine & Rehabilitation

## 2021-03-14 VITALS — BP 127/82 | HR 75 | Temp 98.2°F | Ht 66.0 in | Wt 145.0 lb

## 2021-03-14 DIAGNOSIS — T07XXXA Unspecified multiple injuries, initial encounter: Secondary | ICD-10-CM

## 2021-03-14 DIAGNOSIS — S069X9S Unspecified intracranial injury with loss of consciousness of unspecified duration, sequela: Secondary | ICD-10-CM

## 2021-03-14 MED ORDER — GABAPENTIN 100 MG PO CAPS: 200.0000 mg | ORAL_CAPSULE | Freq: Three times a day (TID) | ORAL | 3 refills | Status: DC

## 2021-03-14 MED ORDER — OXYCODONE HCL 5 MG PO TABS: 5.0000 mg | ORAL_TABLET | Freq: Three times a day (TID) | ORAL | 0 refills | Status: DC | PRN

## 2021-03-14 MED ORDER — TRAZODONE HCL 50 MG PO TABS: 50.0000 mg | ORAL_TABLET | Freq: Every day | ORAL | 3 refills | Status: DC

## 2021-03-14 MED ORDER — METHOCARBAMOL 500 MG PO TABS: 500.0000 mg | ORAL_TABLET | Freq: Three times a day (TID) | ORAL | 4 refills | Status: DC
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
## 2021-03-14 NOTE — Patient Instructions (Signed)
PULL THE STRAPS ON YOUR ANKLE BRACES TIGHTER TO MAKE SURE YOUR FOOT SITS IN THERE AT 90 DEGREES, ESPECIALLY RIGHT SIDE.

## 2021-03-16 ENCOUNTER — Telehealth: Payer: Self-pay

## 2021-03-16 MED ORDER — HYDROXYZINE HCL 25 MG PO TABS: 25.0000 mg | ORAL_TABLET | Freq: Three times a day (TID) | ORAL | 2 refills | Status: DC | PRN

## 2021-03-16 NOTE — Telephone Encounter (Signed)
Notified. 

## 2021-03-16 NOTE — Telephone Encounter (Signed)
RF sent in  

## 2021-03-16 NOTE — Telephone Encounter (Signed)
Patient's mother is calling stating she forgot to request a refill on Hydroxyzine 25 mg that was prescribed in the hospital. Please advise.

## 2021-03-20 ENCOUNTER — Other Ambulatory Visit: Payer: Self-pay

## 2021-03-20 ENCOUNTER — Ambulatory Visit (INDEPENDENT_AMBULATORY_CARE_PROVIDER_SITE_OTHER): Payer: 59 | Admitting: Family Medicine

## 2021-03-20 ENCOUNTER — Encounter: Payer: Self-pay | Admitting: Family Medicine

## 2021-03-20 VITALS — BP 115/76 | HR 67 | Temp 98.3°F | Resp 16

## 2021-03-20 DIAGNOSIS — F32A Depression, unspecified: Secondary | ICD-10-CM | POA: Diagnosis not present

## 2021-03-20 DIAGNOSIS — Z09 Encounter for follow-up examination after completed treatment for conditions other than malignant neoplasm: Secondary | ICD-10-CM

## 2021-03-20 DIAGNOSIS — T07XXXA Unspecified multiple injuries, initial encounter: Secondary | ICD-10-CM

## 2021-03-20 DIAGNOSIS — F419 Anxiety disorder, unspecified: Secondary | ICD-10-CM

## 2021-03-20 MED ORDER — PAROXETINE HCL 10 MG PO TABS: 10.0000 mg | ORAL_TABLET | Freq: Every day | ORAL | 0 refills | Status: DC

## 2021-03-20 NOTE — Progress Notes (Signed)
Patient is  here for HFU. Patient is requesting more pain medication for his legs.

## 2021-03-21 ENCOUNTER — Encounter: Payer: Self-pay | Admitting: Family Medicine
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
## 2021-03-28 ENCOUNTER — Telehealth: Payer: Self-pay

## 2021-03-28 DIAGNOSIS — T07XXXA Unspecified multiple injuries, initial encounter: Secondary | ICD-10-CM

## 2021-03-28 DIAGNOSIS — S069X9S Unspecified intracranial injury with loss of consciousness of unspecified duration, sequela: Secondary | ICD-10-CM

## 2021-03-28 NOTE — Telephone Encounter (Signed)
Call aware that Dr. Riley Kill if out of the office this week:  Kenneth Bautista (Mother) wanted to know if Kenneth Bautista 50 MG can be increased? He is only getting 3 hours of sleep a night. Also he needs refills of Protonix & Vit D3.   I advised her to call the PCP for advice about sleeping and refills.   Please advise.

## 2021-04-01 MED ORDER — TRAZODONE HCL 100 MG PO TABS: 100.0000 mg | ORAL_TABLET | Freq: Every day | ORAL | 3 refills | Status: DC

## 2021-04-01 NOTE — Addendum Note (Signed)
Addended by: Faith Rogue T on: 04/01/2021 08:14 PM   Modules accepted: Orders

## 2021-04-01 NOTE — Telephone Encounter (Signed)
I sent in a new rx for 100mg  dose. He certainly may double up on the 50mg  tabs until they're gone.

## 2021-04-04 ENCOUNTER — Telehealth: Payer: Self-pay | Admitting: Family Medicine

## 2021-04-04 NOTE — Telephone Encounter (Signed)
Med. Refill request for: traZODone (DESYREL) 100 MG tablet [[id number] Pharmacy  [id number] - [place] - [address]  [address], [place] [address]  Phone:  [phone]  Fax:  [phone]     Pt states he feels like this isn't working but he set up a virtual appt (due to having low mobility in his legs at the moment) but has questions regarding the dosage of this medication or what other options he has for sleeping. Please advise and thank you.

## 2021-04-05 ENCOUNTER — Encounter: Payer: Self-pay | Admitting: Nurse Practitioner

## 2021-04-05 ENCOUNTER — Telehealth (INDEPENDENT_AMBULATORY_CARE_PROVIDER_SITE_OTHER): Payer: 59 | Admitting: Nurse Practitioner

## 2021-04-05 DIAGNOSIS — G4700 Insomnia, unspecified: Secondary | ICD-10-CM | POA: Diagnosis not present

## 2021-04-05 NOTE — Progress Notes (Signed)
Virtual Visit via Telephone Note  I connected with Kenneth Bautista on 04/05/21 at  9:00 AM EDT by telephone and verified that I am speaking with the correct person using two identifiers.  Location: Patient: home Provider: office   I discussed the limitations, risks, security and privacy concerns of performing an evaluation and management service by telephone and the availability of in person appointments. I also discussed with the patient that there may be a patient responsible charge related to this service. The patient expressed understanding and agreed to proceed.   History of Present Illness:  Patient presents for medications refill. This is a telephone visit. He states that he needs trazodone refilled. He is requesting a higher dose than 50 mg. He states that he is still not sleeping well. He states that he does not take naps during the day. Upon chart review this medication dose has already been increased by Dr. Andrey Campanile and sent to the pharmacy. Patient made aware of this and he states that he will pick the medication up today. Denies f/c/s, n/v/d, hemoptysis, PND, chest pain or edema.     Observations/Objective:  Vitals with BMI 03/20/2021 03/14/2021 02/26/2021  Height - 5\' 6"  -  Weight (No Data) 145 lbs -  BMI - 23.41 -  Systolic 115 127  Diastolic 76 82 73  Pulse 67 75 67      Assessment and Plan:  Medication Refill:  Trazodone was already sent to the pharmacy and is awaiting pick up  Continue current medications  Follow up:  Follow up as scheduled  Patient Instructions  Medication Refill:  Trazodone was already sent to the pharmacy and is awaiting pick up  Continue current medications  Follow up:  Follow up as scheduled  Insomnia Insomnia is a sleep disorder that makes it difficult to fall asleep or stay asleep. Insomnia can cause fatigue, low energy, difficulty concentrating, mood swings, and poor performance at work or school. There are three  different ways to classify insomnia: Difficulty falling asleep. Difficulty staying asleep. Waking up too early in the morning. Any type of insomnia can be long-term (chronic) or short-term (acute). Both are common. Short-term insomnia usually lasts for three months or less. Chronic insomnia occurs at least three times a week for longer than three months. What are the causes? Insomnia may be caused by another condition, situation, or substance, such as: Anxiety. Certain medicines. Gastroesophageal reflux disease (GERD) or other gastrointestinal conditions. Asthma or other breathing conditions. Restless legs syndrome, sleep apnea, or other sleep disorders. Chronic pain. Menopause. Stroke. Abuse of alcohol, tobacco, or illegal drugs. Mental health conditions, such as depression. Caffeine. Neurological disorders, such as Alzheimer's disease. An overactive thyroid (hyperthyroidism). Sometimes, the cause of insomnia may not be known. What increases the risk? Risk factors for insomnia include: Gender. Women are affected more often than men. Age. Insomnia is more common as you get older. Stress. Lack of exercise. Irregular work schedule or working night shifts. Traveling between different time zones. Certain medical and mental health conditions. What are the signs or symptoms? If you have insomnia, the main symptom is having trouble falling asleep or having trouble staying asleep. This may lead to other symptoms, such as: Feeling fatigued or having low energy. Feeling nervous about going to sleep. Not feeling rested in the morning. Having trouble concentrating. Feeling irritable, anxious, or depressed. How is this diagnosed? This condition may be diagnosed based on: Your symptoms and medical history. Your health care provider may ask about:  Your sleep habits. Any medical conditions you have. Your mental health. A physical exam. How is this treated? Treatment for insomnia depends  on the cause. Treatment may focus on treating an underlying condition that is causing insomnia. Treatment may also include: Medicines to help you sleep. Counseling or therapy. Lifestyle adjustments to help you sleep better. Follow these instructions at home: Eating and drinking  Limit or avoid alcohol, caffeinated beverages, and cigarettes, especially close to bedtime. These can disrupt your sleep. Do not eat a large meal or eat spicy foods right before bedtime. This can lead to digestive discomfort that can make it hard for you to sleep. Sleep habits  Keep a sleep diary to help you and your health care provider figure out what could be causing your insomnia. Write down: When you sleep. When you wake up during the night. How well you sleep. How rested you feel the next day. Any side effects of medicines you are taking. What you eat and drink. Make your bedroom a dark, comfortable place where it is easy to fall asleep. Put up shades or blackout curtains to block light from outside. Use a white noise machine to block noise. Keep the temperature cool. Limit screen use before bedtime. This includes: Watching TV. Using your smartphone, tablet, or computer. Stick to a routine that includes going to bed and waking up at the same times every day and night. This can help you fall asleep faster. Consider making a quiet activity, such as reading, part of your nighttime routine. Try to avoid taking naps during the day so that you sleep better at night. Get out of bed if you are still awake after 15 minutes of trying to sleep. Keep the lights down, but try reading or doing a quiet activity. When you feel sleepy, go back to bed. General instructions Take over-the-counter and prescription medicines only as told by your health care provider. Exercise regularly, as told by your health care provider. Avoid exercise starting several hours before bedtime. Use relaxation techniques to manage stress. Ask  your health care provider to suggest some techniques that may work well for you. These may include: Breathing exercises. Routines to release muscle tension. Visualizing peaceful scenes. Make sure that you drive carefully. Avoid driving if you feel very sleepy. Keep all follow-up visits as told by your health care provider. This is important. Contact a health care provider if: You are tired throughout the day. You have trouble in your daily routine due to sleepiness. You continue to have sleep problems, or your sleep problems get worse. Get help right away if: You have serious thoughts about hurting yourself or someone else. If you ever feel like you may hurt yourself or others, or have thoughts about taking your own life, get help right away. You can go to your nearest emergency department or call: Your local emergency services (911 in the [place] A suicide crisis helpline, such as the National Suicide Prevention Lifeline at 208-065-5850. This is open 24 hours a day. Summary Insomnia is a sleep disorder that makes it difficult to fall asleep or stay asleep. Insomnia can be long-term (chronic) or short-term (acute). Treatment for insomnia depends on the cause. Treatment may focus on treating an underlying condition that is causing insomnia. Keep a sleep diary to help you and your health care provider figure out what could be causing your insomnia. This information is not intended to replace advice given to you by your health care provider. Make sure you discuss any  questions you have with your health care provider. Document Revised: 03/30/2020 Document Reviewed: 03/30/2020 Elsevier Patient Education  2022 [employer].    I discussed the assessment and treatment plan with the patient. The patient was provided an opportunity to ask questions and all were answered. The patient agreed with the plan and demonstrated an understanding of the instructions.   The patient was advised to call back  or seek an in-person evaluation if the symptoms worsen or if the condition fails to improve as anticipated.  I provided 23 minutes of non-face-to-face time during this encounter.   Ivonne Andrew, NP

## 2021-04-05 NOTE — Patient Instructions (Addendum)
Medication Refill:  Trazodone was already sent to the pharmacy and is awaiting pick up  Continue current medications  Follow up:  Follow up as scheduled  Insomnia Insomnia is a sleep disorder that makes it difficult to fall asleep or stay asleep. Insomnia can cause fatigue, low energy, difficulty concentrating, mood swings, and poor performance at work or school. There are three different ways to classify insomnia: Difficulty falling asleep. Difficulty staying asleep. Waking up too early in the morning. Any type of insomnia can be long-term (chronic) or short-term (acute). Both are common. Short-term insomnia usually lasts for three months or less. Chronic insomnia occurs at least three times a week for longer than three months. What are the causes? Insomnia may be caused by another condition, situation, or substance, such as: Anxiety. Certain medicines. Gastroesophageal reflux disease (GERD) or other gastrointestinal conditions. Asthma or other breathing conditions. Restless legs syndrome, sleep apnea, or other sleep disorders. Chronic pain. Menopause. Stroke. Abuse of alcohol, tobacco, or illegal drugs. Mental health conditions, such as depression. Caffeine. Neurological disorders, such as Alzheimer's disease. An overactive thyroid (hyperthyroidism). Sometimes, the cause of insomnia may not be known. What increases the risk? Risk factors for insomnia include: Gender. Women are affected more often than men. Age. Insomnia is more common as you get older. Stress. Lack of exercise. Irregular work schedule or working night shifts. Traveling between different time zones. Certain medical and mental health conditions. What are the signs or symptoms? If you have insomnia, the main symptom is having trouble falling asleep or having trouble staying asleep. This may lead to other symptoms, such as: Feeling fatigued or having low energy. Feeling nervous about going to sleep. Not  feeling rested in the morning. Having trouble concentrating. Feeling irritable, anxious, or depressed. How is this diagnosed? This condition may be diagnosed based on: Your symptoms and medical history. Your health care provider may ask about: Your sleep habits. Any medical conditions you have. Your mental health. A physical exam. How is this treated? Treatment for insomnia depends on the cause. Treatment may focus on treating an underlying condition that is causing insomnia. Treatment may also include: Medicines to help you sleep. Counseling or therapy. Lifestyle adjustments to help you sleep better. Follow these instructions at home: Eating and drinking  Limit or avoid alcohol, caffeinated beverages, and cigarettes, especially close to bedtime. These can disrupt your sleep. Do not eat a large meal or eat spicy foods right before bedtime. This can lead to digestive discomfort that can make it hard for you to sleep. Sleep habits  Keep a sleep diary to help you and your health care provider figure out what could be causing your insomnia. Write down: When you sleep. When you wake up during the night. How well you sleep. How rested you feel the next day. Any side effects of medicines you are taking. What you eat and drink. Make your bedroom a dark, comfortable place where it is easy to fall asleep. Put up shades or blackout curtains to block light from outside. Use a white noise machine to block noise. Keep the temperature cool. Limit screen use before bedtime. This includes: Watching TV. Using your smartphone, tablet, or computer. Stick to a routine that includes going to bed and waking up at the same times every day and night. This can help you fall asleep faster. Consider making a quiet activity, such as reading, part of your nighttime routine. Try to avoid taking naps during the day so that you sleep better  at night. Get out of bed if you are still awake after 15 minutes of  trying to sleep. Keep the lights down, but try reading or doing a quiet activity. When you feel sleepy, go back to bed. General instructions Take over-the-counter and prescription medicines only as told by your health care provider. Exercise regularly, as told by your health care provider. Avoid exercise starting several hours before bedtime. Use relaxation techniques to manage stress. Ask your health care provider to suggest some techniques that may work well for you. These may include: Breathing exercises. Routines to release muscle tension. Visualizing peaceful scenes. Make sure that you drive carefully. Avoid driving if you feel very sleepy. Keep all follow-up visits as told by your health care provider. This is important. Contact a health care provider if: You are tired throughout the day. You have trouble in your daily routine due to sleepiness. You continue to have sleep problems, or your sleep problems get worse. Get help right away if: You have serious thoughts about hurting yourself or someone else. If you ever feel like you may hurt yourself or others, or have thoughts about taking your own life, get help right away. You can go to your nearest emergency department or call: Your local emergency services (911 in the [place] A suicide crisis helpline, such as the National Suicide Prevention Lifeline at 913-499-9141. This is open 24 hours a day. Summary Insomnia is a sleep disorder that makes it difficult to fall asleep or stay asleep. Insomnia can be long-term (chronic) or short-term (acute). Treatment for insomnia depends on the cause. Treatment may focus on treating an underlying condition that is causing insomnia. Keep a sleep diary to help you and your health care provider figure out what could be causing your insomnia. This information is not intended to replace advice given to you by your health care provider. Make sure you discuss any questions you have with your health care  provider. Document Revised: 03/30/2020 Document Reviewed: 03/30/2020 Elsevier Patient Education  2022 [employer].

## 2021-04-09 ENCOUNTER — Emergency Department (HOSPITAL_COMMUNITY): Payer: 59

## 2021-04-09 ENCOUNTER — Telehealth: Payer: Self-pay

## 2021-04-09 ENCOUNTER — Emergency Department (HOSPITAL_COMMUNITY)
Admission: EM | Admit: 2021-04-09 | Discharge: 2021-04-09 | Disposition: A | Discharge status: Home / Self Care | Payer: 59 | Attending: Emergency Medicine | Admitting: Emergency Medicine

## 2021-04-09 ENCOUNTER — Other Ambulatory Visit: Payer: Self-pay

## 2021-04-09 ENCOUNTER — Emergency Department (HOSPITAL_BASED_OUTPATIENT_CLINIC_OR_DEPARTMENT_OTHER): Payer: 59

## 2021-04-09 DIAGNOSIS — S069X9S Unspecified intracranial injury with loss of consciousness of unspecified duration, sequela: Secondary | ICD-10-CM

## 2021-04-09 DIAGNOSIS — M79604 Pain in right leg: Secondary | ICD-10-CM | POA: Diagnosis present

## 2021-04-09 DIAGNOSIS — Z5321 Procedure and treatment not carried out due to patient leaving prior to being seen by health care provider: Secondary | ICD-10-CM | POA: Insufficient documentation

## 2021-04-09 DIAGNOSIS — T07XXXA Unspecified multiple injuries, initial encounter: Secondary | ICD-10-CM

## 2021-04-09 NOTE — ED Notes (Signed)
Pt called multiple times no answer 

## 2021-04-09 NOTE — Progress Notes (Signed)
Lower extremity venous RT study completed.  Preliminary results relayed to [place], [place].  See CV Proc for preliminary results report.   Jean Rosenthal, RDMS, RVT

## 2021-04-09 NOTE — Telephone Encounter (Signed)
Patient's mother is calling to request refill on Oxycodone. Last fill was 03/14/21 for 21 tablets.
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
## 2021-04-10 MED ORDER — OXYCODONE HCL 5 MG PO TABS: 5.0000 mg | ORAL_TABLET | Freq: Three times a day (TID) | ORAL | 0 refills | Status: DC | PRN

## 2021-04-10 NOTE — Telephone Encounter (Signed)
Spoke with patient's mother and she stated she picked up the Oxycodone. She stated he did see Dr. Everardo Pacific, the Orthopedic surgeon, today. She stated he stayed at the ER for 3 hours yesterday then left, but the ER note says he was seen and discharged after doppler. Informed her that he would need to see the orthopedic surgeon if the limb pain continues

## 2021-04-10 NOTE — Telephone Encounter (Signed)
So...the patient was literally in the ED for right leg pain yesterday. Did we ask the patient why he was in the ED and asking for more pain medications essentially simultaneously? He probably did not need to go to ED for this problem. Dopplers were negative.  Pt was given #60 oxycodone last month. If he continues to have pain in the limb, he needs to follow up with orthopedic surgery for the source of his ongoing pain.   I will send in #60 of the oxycodone today, but the intention was to be weaning off narcotics.   ZTS

## 2021-04-10 NOTE — Addendum Note (Signed)
Addended by: Faith Rogue T on: 04/10/2021 11:30 AM   Modules accepted: Orders

## 2021-04-19 ENCOUNTER — Ambulatory Visit (INDEPENDENT_AMBULATORY_CARE_PROVIDER_SITE_OTHER): Payer: 59 | Admitting: Family Medicine

## 2021-04-19 ENCOUNTER — Encounter: Payer: Self-pay | Admitting: Family Medicine

## 2021-04-19 ENCOUNTER — Other Ambulatory Visit: Payer: Self-pay

## 2021-04-19 VITALS — BP 110/73 | HR 66 | Temp 97.8°F | Resp 16

## 2021-04-19 DIAGNOSIS — T07XXXA Unspecified multiple injuries, initial encounter: Secondary | ICD-10-CM | POA: Diagnosis not present

## 2021-04-19 DIAGNOSIS — F419 Anxiety disorder, unspecified: Secondary | ICD-10-CM | POA: Diagnosis not present

## 2021-04-19 DIAGNOSIS — F32A Depression, unspecified: Secondary | ICD-10-CM | POA: Diagnosis not present

## 2021-04-19 MED ORDER — PAROXETINE HCL 20 MG PO TABS: 20.0000 mg | ORAL_TABLET | Freq: Every day | ORAL | 0 refills | Status: DC
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
## 2021-04-25 ENCOUNTER — Encounter (HOSPITAL_COMMUNITY): Payer: Self-pay | Admitting: Licensed Clinical Social Worker

## 2021-04-25 ENCOUNTER — Ambulatory Visit (INDEPENDENT_AMBULATORY_CARE_PROVIDER_SITE_OTHER): Payer: 59 | Admitting: Licensed Clinical Social Worker

## 2021-04-25 DIAGNOSIS — F4323 Adjustment disorder with mixed anxiety and depressed mood: Secondary | ICD-10-CM
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
## 2021-04-25 NOTE — Plan of Care (Signed)
Pt agreeable to plan verbally.

## 2021-05-11 ENCOUNTER — Telehealth (HOSPITAL_COMMUNITY): Payer: Self-pay | Admitting: Licensed Clinical Social Worker

## 2021-05-11 NOTE — Telephone Encounter (Signed)
LCSW called to confirm appt and pt did confirm virtual appt on 05/14/21

## 2021-05-14 ENCOUNTER — Ambulatory Visit (HOSPITAL_COMMUNITY): Payer: 59 | Admitting: Licensed Clinical Social Worker

## 2021-05-14 DIAGNOSIS — F4323 Adjustment disorder with mixed anxiety and depressed mood: Secondary | ICD-10-CM
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
## 2021-05-23 ENCOUNTER — Encounter: Payer: 59 | Admitting: Physical Medicine & Rehabilitation

## 2021-06-12 ENCOUNTER — Encounter: Payer: Self-pay | Admitting: Podiatry

## 2021-06-12 ENCOUNTER — Ambulatory Visit: Payer: 59 | Admitting: Podiatry

## 2021-06-12 ENCOUNTER — Other Ambulatory Visit: Payer: Self-pay

## 2021-06-12 DIAGNOSIS — M79675 Pain in left toe(s): Secondary | ICD-10-CM | POA: Diagnosis not present

## 2021-06-12 DIAGNOSIS — B351 Tinea unguium: Secondary | ICD-10-CM

## 2021-06-12 DIAGNOSIS — M79674 Pain in right toe(s): Secondary | ICD-10-CM
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
## 2021-06-13 ENCOUNTER — Ambulatory Visit: Payer: 59 | Admitting: Podiatry

## 2021-06-14 ENCOUNTER — Emergency Department (HOSPITAL_BASED_OUTPATIENT_CLINIC_OR_DEPARTMENT_OTHER): Payer: 59 | Admitting: Radiology

## 2021-06-14 ENCOUNTER — Emergency Department (HOSPITAL_BASED_OUTPATIENT_CLINIC_OR_DEPARTMENT_OTHER)
Admission: EM | Admit: 2021-06-14 | Discharge: 2021-06-14 | Disposition: A | Discharge status: Home / Self Care | Payer: 59 | Attending: Emergency Medicine | Admitting: Emergency Medicine

## 2021-06-14 ENCOUNTER — Encounter (HOSPITAL_BASED_OUTPATIENT_CLINIC_OR_DEPARTMENT_OTHER): Payer: Self-pay | Admitting: Emergency Medicine

## 2021-06-14 ENCOUNTER — Emergency Department (HOSPITAL_BASED_OUTPATIENT_CLINIC_OR_DEPARTMENT_OTHER): Payer: 59

## 2021-06-14 ENCOUNTER — Other Ambulatory Visit: Payer: Self-pay

## 2021-06-14 DIAGNOSIS — W010XXA Fall on same level from slipping, tripping and stumbling without subsequent striking against object, initial encounter: Secondary | ICD-10-CM | POA: Diagnosis not present

## 2021-06-14 DIAGNOSIS — M25561 Pain in right knee: Secondary | ICD-10-CM | POA: Insufficient documentation

## 2021-06-14 DIAGNOSIS — S8991XA Unspecified injury of right lower leg, initial encounter: Secondary | ICD-10-CM
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
## 2021-06-14 NOTE — Discharge Instructions (Addendum)
Your CT scan looks good today.  All of your hardware appears to be in place.  Please follow-up with your orthopedic if you have continued discomfort.  You may also start with your primary care provider if needed.  You may use Tylenol and/or ibuprofen to alleviate any pain.

## 2021-06-14 NOTE — ED Triage Notes (Signed)
Walks with walker and last night he got up and  feet went out  from under him and he fell , did not hit head , today then he states  his rt leg went numb for a few mins hurts to move his leg

## 2021-06-14 NOTE — ED Notes (Signed)
Patient transported to CT 

## 2021-06-15 ENCOUNTER — Encounter (HOSPITAL_COMMUNITY): Payer: Self-pay

## 2021-06-15 ENCOUNTER — Ambulatory Visit (HOSPITAL_COMMUNITY): Payer: 59 | Admitting: Licensed Clinical Social Worker

## 2021-06-24 ENCOUNTER — Other Ambulatory Visit: Payer: Self-pay | Admitting: Physical Medicine & Rehabilitation

## 2021-06-29 ENCOUNTER — Ambulatory Visit (INDEPENDENT_AMBULATORY_CARE_PROVIDER_SITE_OTHER): Payer: 59 | Admitting: Licensed Clinical Social Worker

## 2021-06-29 DIAGNOSIS — F4323 Adjustment disorder with mixed anxiety and depressed mood: Secondary | ICD-10-CM
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
## 2021-07-02 ENCOUNTER — Other Ambulatory Visit: Payer: Self-pay

## 2021-07-02 ENCOUNTER — Ambulatory Visit (HOSPITAL_BASED_OUTPATIENT_CLINIC_OR_DEPARTMENT_OTHER)
Admission: RE | Admit: 2021-07-02 | Discharge: 2021-07-02 | Disposition: A | Discharge status: Home / Self Care | Payer: 59 | Source: Ambulatory Visit | Attending: Orthopaedic Surgery | Admitting: Orthopaedic Surgery

## 2021-07-02 ENCOUNTER — Ambulatory Visit (INDEPENDENT_AMBULATORY_CARE_PROVIDER_SITE_OTHER): Payer: 59 | Admitting: Orthopaedic Surgery

## 2021-07-02 ENCOUNTER — Other Ambulatory Visit (HOSPITAL_BASED_OUTPATIENT_CLINIC_OR_DEPARTMENT_OTHER): Payer: Self-pay | Admitting: Orthopaedic Surgery

## 2021-07-02 DIAGNOSIS — M25562 Pain in left knee: Secondary | ICD-10-CM

## 2021-07-02 DIAGNOSIS — M25561 Pain in right knee: Secondary | ICD-10-CM
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
## 2021-07-09 ENCOUNTER — Ambulatory Visit (HOSPITAL_COMMUNITY)
Admission: RE | Admit: 2021-07-09 | Discharge: 2021-07-09 | Disposition: A | Discharge status: Home / Self Care | Payer: 59 | Source: Ambulatory Visit | Attending: Orthopaedic Surgery | Admitting: Orthopaedic Surgery

## 2021-07-09 ENCOUNTER — Other Ambulatory Visit: Payer: Self-pay

## 2021-07-09 DIAGNOSIS — M25561 Pain in right knee: Secondary | ICD-10-CM | POA: Insufficient documentation

## 2021-07-12 ENCOUNTER — Other Ambulatory Visit: Payer: Self-pay | Admitting: Physical Medicine & Rehabilitation

## 2021-07-12 DIAGNOSIS — S069X9S Unspecified intracranial injury with loss of consciousness of unspecified duration, sequela: Secondary | ICD-10-CM

## 2021-07-12 DIAGNOSIS — T07XXXA Unspecified multiple injuries, initial encounter: Secondary | ICD-10-CM

## 2021-07-13 ENCOUNTER — Ambulatory Visit (INDEPENDENT_AMBULATORY_CARE_PROVIDER_SITE_OTHER): Payer: 59 | Admitting: Family Medicine

## 2021-07-13 ENCOUNTER — Encounter: Payer: Self-pay | Admitting: Family Medicine

## 2021-07-13 ENCOUNTER — Other Ambulatory Visit: Payer: Self-pay

## 2021-07-13 VITALS — BP 123/76 | HR 64 | Temp 98.1°F | Resp 16 | Wt 174.8 lb

## 2021-07-13 DIAGNOSIS — T07XXXA Unspecified multiple injuries, initial encounter: Secondary | ICD-10-CM

## 2021-07-13 DIAGNOSIS — F32A Depression, unspecified: Secondary | ICD-10-CM | POA: Diagnosis not present

## 2021-07-13 DIAGNOSIS — F419 Anxiety disorder, unspecified: Secondary | ICD-10-CM

## 2021-07-13 MED ORDER — METHOCARBAMOL 500 MG PO TABS: 500.0000 mg | ORAL_TABLET | Freq: Three times a day (TID) | ORAL | 3 refills | Status: DC

## 2021-07-13 MED ORDER — GABAPENTIN 100 MG PO CAPS: 200.0000 mg | ORAL_CAPSULE | Freq: Three times a day (TID) | ORAL | 0 refills | Status: DC

## 2021-07-13 MED ORDER — PAROXETINE HCL 30 MG PO TABS: 30.0000 mg | ORAL_TABLET | Freq: Every day | ORAL | 2 refills | Status: DC

## 2021-07-13 MED ORDER — HYDROXYZINE HCL 25 MG PO TABS: 25.0000 mg | ORAL_TABLET | Freq: Three times a day (TID) | ORAL | 2 refills | Status: DC | PRN

## 2021-07-13 MED ORDER — TRAZODONE HCL 100 MG PO TABS: 100.0000 mg | ORAL_TABLET | Freq: Every day | ORAL | 3 refills | Status: DC

## 2021-07-13 NOTE — Progress Notes (Signed)
Patient is here for follow-up appt.  Patient would like a note for service dog. Patient will need to have another sx on his leg that has fluid.
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
## 2021-07-20 ENCOUNTER — Ambulatory Visit: Payer: 59 | Admitting: Family Medicine

## 2021-07-30 ENCOUNTER — Ambulatory Visit (INDEPENDENT_AMBULATORY_CARE_PROVIDER_SITE_OTHER): Payer: 59 | Admitting: Orthopaedic Surgery

## 2021-07-30 ENCOUNTER — Ambulatory Visit (HOSPITAL_BASED_OUTPATIENT_CLINIC_OR_DEPARTMENT_OTHER): Payer: Self-pay | Admitting: Orthopaedic Surgery

## 2021-07-30 ENCOUNTER — Other Ambulatory Visit: Payer: Self-pay

## 2021-07-30 ENCOUNTER — Other Ambulatory Visit (HOSPITAL_BASED_OUTPATIENT_CLINIC_OR_DEPARTMENT_OTHER): Payer: Self-pay

## 2021-07-30 DIAGNOSIS — M24661 Ankylosis, right knee: Secondary | ICD-10-CM | POA: Diagnosis not present

## 2021-07-30 MED ORDER — ASPIRIN EC 325 MG PO TBEC
325.0000 mg | DELAYED_RELEASE_TABLET | Freq: Every day | ORAL | 0 refills | Status: AC
  Filled 2021-07-30: qty 30, 30d supply, fill #0

## 2021-07-30 MED ORDER — OXYCODONE HCL 5 MG PO TABS
5.0000 mg | ORAL_TABLET | ORAL | 0 refills | Status: DC | PRN
  Filled 2021-07-30: qty 20, 4d supply, fill #0

## 2021-07-30 MED ORDER — IBUPROFEN 800 MG PO TABS
800.0000 mg | ORAL_TABLET | Freq: Three times a day (TID) | ORAL | 0 refills | Status: AC
  Filled 2021-07-30: qty 30, 10d supply, fill #0

## 2021-07-30 MED ORDER — ACETAMINOPHEN 500 MG PO TABS
500.0000 mg | ORAL_TABLET | Freq: Three times a day (TID) | ORAL | 0 refills | Status: AC
  Filled 2021-07-30: qty 30, 10d supply, fill #0
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
## 2021-07-30 NOTE — H&P (View-Only) (Signed)
Chief Complaint: Bilateral knee and tibia pain     History of Present Illness:   07/30/2021: Presents today for MRI interpretation and discussion.  Right knee continues to be painful with range of motion limited.  Denies any fevers or chills.  He does have a nicotine patch but occasionally continues to occasionally smoke cigarettes  Kenneth Bautista is a 37 y.o. male presents status post right knee multi ligamentous reconstruction with ACL, PCL, posterior lateral reconstruction done on February 01, 2021 by Dr. Ophelia Charter and left tibial plateau open reduction internal fixation done by Dr. Doreatha Martin on January 27, 2021.  He presents with persistent knee pain bilaterally.  States that both are bothering him significantly.  He is having extremely difficult time ambulating on either side due to the pain.  States that the right knee has quite significant limited range of motion.  Left tibia has pain proximally with weightbearing.  He continues to smoke significantly at least a pack a day.    Surgical History:   None  PMH/PSH/Family History/Social History/Meds/Allergies:    Past Medical History:  Diagnosis Date   Arthritis    Asthma    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HCC)    Dyspnea    Epigastric hernia    GERD (gastroesophageal reflux disease)    History of 2019 novel coronavirus disease (COVID-19) 06/24/2019   Pneumonia    Polysubstance abuse ([place])    ETOH, marijuana, cocaine, amphetamines   Tachycardia    Past Surgical History:  Procedure Laterality Date   ANTERIOR CRUCIATE LIGAMENT REPAIR Right 02/01/2021   Procedure: RECONSTRUCTION ANTERIOR CRUCIATE LIGAMENT (ACL);  Surgeon: Hiram Gash, MD;  Location: [facility];  Service: Orthopedics;  Laterality: Right;   EPIGASTRIC HERNIA REPAIR N/A 11/15/2020   Procedure: HERNIA REPAIR EPIGASTRIC ADULT, open;  Surgeon: Fredirick Maudlin, MD;  Location: [facility];  Service: General;  Laterality: N/A;   EXTERNAL FIXATION LEG Right  01/27/2021   Procedure: EXTERNAL FIXATION KNEE;  Surgeon: Shona Needles, MD;  Location: [place];  Service: Orthopedics;  Laterality: Right;   HERNIA REPAIR     None     ORIF ELBOW FRACTURE Right 01/27/2021   Procedure: OPEN REDUCTION INTERNAL FIXATION (ORIF) ELBOW/OLECRANON FRACTURE;  Surgeon: Shona Needles, MD;  Location: [place];  Service: Orthopedics;  Laterality: Right;   ORIF TIBIA FRACTURE Left 01/27/2021   Procedure: OPEN REDUCTION INTERNAL FIXATION (ORIF) TIBIA FRACTURE;  Surgeon: Shona Needles, MD;  Location: [facility] [place];  Service: Orthopedics;  Laterality: Left;   POSTERIOR CRUCIATE LIGAMENT RECONSTRUCTION Right 02/01/2021   Procedure: RECONSTRUCTION POSTERIOR CRUCIATE LIGAMENT (PCL);  Surgeon: Hiram Gash, MD;  Location: [place];  Service: Orthopedics;  Laterality: Right;   Social History   Socioeconomic History   Marital status: Single    Spouse name: Not on file   Number of children: Not on file   Years of education: Not on file   Highest education level: Not on file  Occupational History   Not on file  Tobacco Use   Smoking status: Every Day    Packs/day: 0.50    Years: 20.00    Pack years: 10.00    Types: Cigarettes   Smokeless tobacco: Never  Vaping Use   Vaping Use: Never used  Substance and Sexual Activity   Alcohol use: Yes  Comment: none right now sober since accident.   Drug use: Not Currently    Types: Cocaine, Amphetamines, Marijuana    Comment: has not used drugs since Augest 1st   Sexual activity: Yes    Partners: Female  Other Topics Concern   Not on file  Social History Narrative   ** Merged History Encounter **       Social Determinants of Health   Financial Resource Strain: High Risk   Difficulty of Paying Living Expenses: Very hard  Food Insecurity: No Food Insecurity   Worried About Charity fundraiser in the Last Year: Never true   Ran Out of Food in the Last Year: Never true  Transportation Needs: No Transportation Needs   Lack of  Transportation (Medical): No   Lack of Transportation (Non-Medical): No  Physical Activity: Inactive   Days of Exercise per Week: 0 days   Minutes of Exercise per Session: 0 min  Stress: Stress Concern Present   Feeling of Stress : Very much  Social Connections: Moderately Isolated   Frequency of Communication with Friends and Family: More than three times a week   Frequency of Social Gatherings with Friends and Family: More than three times a week   Attends Religious Services: More than 4 times per year   Active Member of [employer] or Organizations: No   Attends Music therapist: Never   Marital Status: Divorced   Family History  Problem Relation Age of Onset   Heart disease Mother        details unknown   Heart disease Father        details unknown   No Known Allergies Current Outpatient Medications  Medication Sig Dispense Refill   acetaminophen (TYLENOL) 500 MG tablet Take 1 tablet (500 mg total) by mouth every 8 (eight) hours for 10 days. 30 tablet 0   albuterol (VENTOLIN HFA) 108 (90 Base) MCG/ACT inhaler Inhale 2 puffs into the lungs every 6 (six) hours as needed for wheezing or shortness of breath. (Patient not taking: Reported on 07/13/2021) 8.5 g 0   aspirin EC 325 MG tablet Take 1 tablet (325 mg total) by mouth daily. 30 tablet 0   gabapentin (NEURONTIN) 100 MG capsule Take 2 capsules (200 mg total) by mouth 3 (three) times daily. 180 capsule 0   hydrOXYzine (ATARAX) 25 MG tablet Take 1 tablet (25 mg total) by mouth 3 (three) times daily as needed. for anxiety 20 tablet 2   ibuprofen (ADVIL) 800 MG tablet Take 1 tablet (800 mg total) by mouth every 8 (eight) hours for 10 days. Please take with food, please alternate with acetaminophen 30 tablet 0   methocarbamol (ROBAXIN) 500 MG tablet Take 1 tablet (500 mg total) by mouth 3 (three) times daily. 90 tablet 3   Multiple Vitamin (MULTIVITAMIN WITH MINERALS) TABS tablet Take 1 tablet by mouth daily. (Patient not taking:  Reported on 07/13/2021)     nicotine (NICODERM CQ - DOSED IN MG/24 HOURS) 14 mg/24hr patch Apply one 14 mg patch daily x2 weeks then 7 mg patch daily x3 weeks and stop (Patient not taking: Reported on 07/13/2021) 28 patch 0   oxycodone (OXY-IR) 5 MG capsule Take 1 capsule (5 mg total) by mouth every 4 (four) hours as needed (severe pain). 20 capsule 0   PARoxetine (PAXIL) 30 MG tablet Take 1 tablet (30 mg total) by mouth daily. 30 tablet 2   traZODone (DESYREL) 100 MG tablet TAKE 1 TABLET BY MOUTH AT  BEDTIME 30 tablet 0   traZODone (DESYREL) 100 MG tablet Take 1 tablet (100 mg total) by mouth at bedtime. 30 tablet 3   Vitamin D3 (VITAMIN D) 25 MCG tablet Take 1 tablet (1,000 Units total) by mouth daily. (Patient not taking: Reported on 07/13/2021) 30 tablet 0   No current facility-administered medications for this visit.   No results found.  Review of Systems:   A ROS was performed including pertinent positives and negatives as documented in the HPI.  Physical Exam :   Constitutional: NAD and appears stated age Neurological: Alert and oriented Psych: Appropriate affect and cooperative There were no vitals taken for this visit.   Comprehensive Musculoskeletal Exam:      Musculoskeletal Exam     Alignment Normal   Right Left  Inspection Normal Normal  Palpation    Tenderness Diffuse right knee, Kenneth Bautista medial lateral joint Mid proximal tibia  Crepitus None None  Effusion Moderate None  Range of Motion    Extension 0 0  Flexion 90 135  Strength    Extension 5/5 5/5  Flexion 5/5 5/5  Ligament Exam     Generalized Laxity No No  Lachman Negative Negative   Pivot Shift Negative Negative  Anterior Drawer Negative Negative  Valgus at 0 Negative Negative  Valgus at 20 Negative Negative  Varus at 0 Grade 2 opening Negative  Varus at 20   Grade 2 opening Negative  Posterior Drawer at 90 0 0  Vascular/Lymphatic Exam    Edema None None  Venous Stasis Changes No No  Distal  Circulation Normal Normal  Neurologic    Light Touch Sensation Intact Intact  Special Tests:      Imaging:   Xray (4 views right knee, 4 views left knee): Status post multi ligamentous reconstruction on the right with ACL PCL as well as posterior lateral corner reconstructions  Status post left proximal tibial plateau open reduction internal fixation with 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  The proximal fibula repair is not healed.  He is status post lateral ligamentous reconstruction.  I personally reviewed and interpreted the radiographs.   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  He does have residual laxity with varus on examination today.  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  He does have pain with weightbearing on the right knee.  At this time I am not concerned for infection.  He does have minimal to no pain with mid range of motion from 0 to 70 degrees.  He does state that the knee feels to occasionally give out and he does have laxity with varus on his examination.  I discussed treatment options with regard to the right knee.  Given the fact that it has been well over 3 months I do believe that it is unlikely at this time that he will have return to motion with just physical therapy alone.  I did discuss the role for arthroscopic debridement and lysis of adhesions.  I did discuss that this may ultimately leave him for risk of recurrent instability.  As a result I do believe that a lateral extra-articular tenodesis could provide an added component of lateral stability.  This is particularly true given the fact that he has already had a reconstruction on  the outside and I do not necessarily believe that a revision reconstruction would be indicated at this  junction and timeframe.  I discussed that the ideal plan would be to perform arthroscopic lysis of adhesions and manipulation.  If this led to worsening varus instability we would consider a lateral extra-articular tenodesis as well.  Plan :    -Plan for right knee arthroscopy with lysis of adhesion, manipulation under anesthesia, possible lateral extra-articular tenodesis   After a lengthy discussion of treatment options, including risks, benefits, alternatives, complications of surgical and nonsurgical conservative options, the patient elected surgical repair.   The patient  is aware of the material risks  and complications including, but not limited to injury to adjacent structures, neurovascular injury, infection, numbness, bleeding, implant failure, thermal burns, stiffness, persistent pain, failure to heal, disease transmission from allograft, need for further surgery, dislocation, anesthetic risks, blood clots, risks of death,and others. The probabilities of surgical success and failure discussed with patient given their particular co-morbidities.The time and nature of expected rehabilitation and recovery was discussed.The patient's questions were all answered preoperatively.  No barriers to understanding were noted. I explained the natural history of the disease process and Rx rationale.  I explained to the patient what I considered to be reasonable expectations given their personal situation.  The final treatment plan was arrived at through a shared patient decision making process model.      I personally saw and evaluated the patient, and participated in the management and treatment plan.  Vanetta Mulders, MD Attending Physician, Orthopedic Surgery  This document was dictated using Dragon voice recognition software. A reasonable attempt at proof reading has been made to minimize errors.

## 2021-08-01 ENCOUNTER — Encounter: Payer: 59 | Attending: Physical Medicine & Rehabilitation | Admitting: Physical Medicine & Rehabilitation

## 2021-08-01 NOTE — Pre-Procedure Instructions (Addendum)
Surgical Instructions ? ? ? Your procedure is scheduled on Monday, March 6th. ? Report to [facility] Main Entrance "A" at 11:30 A.M., then check in with the Admitting office. ? Call this number if you have problems the morning of surgery: ? [phone] ? ? If you have any questions prior to your surgery date call [phone]: Open Monday-Friday 8am-4pm ? ? ? Remember: ? Do not eat after midnight the night before your surgery ? ?You may drink clear liquids until 10:30 a.m. the morning of your surgery.   ?Clear liquids allowed are: Water, Non-Citrus Juices (without pulp), Carbonated Beverages, Clear Tea, Black Coffee ONLY (NO MILK, CREAM OR POWDERED CREAMER of any kind), and Gatorade. ? ? ?Enhanced Recovery after Surgery for Orthopedics ?Enhanced Recovery after Surgery is a protocol used to improve the stress on your body and your recovery after surgery. ? ?Patient Instructions ? ?The day of surgery (if you do NOT have diabetes):  ?Drink ONE (1) Pre-Surgery Clear Ensure by 10:30 a.m. the morning of surgery   ?This drink was given to you during your hospital  ?pre-op appointment visit. ?Nothing else to drink after completing the  ?Pre-Surgery Clear Ensure. ? ? ?       If you have questions, please contact your surgeon?s office. ? ?  ? Take these medicines the morning of surgery with A SIP OF WATER:  ?gabapentin (NEURONTIN) ?hydrOXYzine (ATARAX) ?methocarbamol (ROBAXIN)  ? ?As needed: ?acetaminophen (TYLENOL)  ?albuterol (VENTOLIN HFA)-please bring with you to the hospital. ?oxyCODONE (OXY IR/ROXICODONE)  ? ?Follow your surgeon's instructions on when to stop Aspirin.  If no instructions were given by your surgeon then you will need to call the office to get those instructions.   ? ?As of today, STOP taking any Aleve, Naproxen, Ibuprofen, Motrin, Advil, Goody's, BC's, all herbal medications, fish oil, and all vitamins. ? ?         ?Do not wear jewelry. ?Do not wear lotions, powders, colognes, or deodorant. ?Men may  shave face and neck. ?Do not bring valuables to the hospital. ?Do not wear nail polish, gel polish, artificial nails, or any other type of covering on natural nails (fingers and toes) ?If you have artificial nails or gel coating that need to be removed by a nail salon, please have this removed prior to surgery. Artificial nails or gel coating may interfere with anesthesia's ability to adequately monitor your vital signs. ? ?[facility] is not responsible for any belongings or valuables. .  ? ?Do NOT Smoke (Tobacco/Vaping)  24 hours prior to your procedure ? ?If you use a CPAP at night, you may bring your mask for your overnight stay. ?  ?Contacts, glasses, hearing aids, dentures or partials may not be worn into surgery, please bring cases for these belongings ?  ?For patients admitted to the hospital, discharge time will be determined by your treatment team. ?  ?Patients discharged the day of surgery will not be allowed to drive home, and someone needs to stay with them for 24 hours. ? ?NO VISITORS WILL BE ALLOWED IN PRE-OP WHERE PATIENTS ARE PREPPED FOR SURGERY.  ONLY 1 SUPPORT PERSON MAY BE PRESENT IN THE WAITING ROOM WHILE YOU ARE IN SURGERY.  IF YOU ARE TO BE ADMITTED, ONCE YOU ARE IN YOUR ROOM YOU WILL BE ALLOWED TWO (2) VISITORS. 1 (ONE) VISITOR MAY STAY OVERNIGHT BUT MUST ARRIVE TO THE ROOM BY 8pm.  Minor children may have two parents present. Special consideration for safety and communication needs  will be reviewed on a case by case basis. ? ?Special instructions:   ? ?Oral Hygiene is also important to reduce your risk of infection.  Remember - BRUSH YOUR TEETH THE MORNING OF SURGERY WITH YOUR REGULAR TOOTHPASTE ? ? ?[facility]- Preparing For Surgery ? ?Before surgery, you can play an important role. Because skin is not sterile, your skin needs to be as free of germs as possible. You can reduce the number of germs on your skin by washing with CHG (chlorahexidine gluconate) Soap before surgery.  CHG is an  antiseptic cleaner which kills germs and bonds with the skin to continue killing germs even after washing.   ? ? ?Please do not use if you have an allergy to CHG or antibacterial soaps. If your skin becomes reddened/irritated stop using the CHG.  ?Do not shave (including legs and underarms) for at least 48 hours prior to first CHG shower. It is OK to shave your face. ? ?Please follow these instructions carefully. ?  ? ? Shower the NIGHT BEFORE SURGERY and the MORNING OF SURGERY with CHG Soap.  ? If you chose to wash your hair, wash your hair first as usual with your normal shampoo. After you shampoo, rinse your hair and body thoroughly to remove the shampoo.  Then [employer] and genitals (private parts) with your normal soap and rinse thoroughly to remove soap. ? ?After that Use CHG Soap as you would any other liquid soap. You can apply CHG directly to the skin and wash gently with a scrungie or a clean washcloth.  ? ?Apply the CHG Soap to your body ONLY FROM THE NECK DOWN.  Do not use on open wounds or open sores. Avoid contact with your eyes, ears, mouth and genitals (private parts). Wash Face and genitals (private parts)  with your normal soap.  ? ?Wash thoroughly, paying special attention to the area where your surgery will be performed. ? ?Thoroughly rinse your body with warm water from the neck down. ? ?DO NOT shower/wash with your normal soap after using and rinsing off the CHG Soap. ? ?Pat yourself dry with a CLEAN TOWEL. ? ?Wear CLEAN PAJAMAS to bed the night before surgery ? ?Place CLEAN SHEETS on your bed the night before your surgery ? ?DO NOT SLEEP WITH PETS. ? ? ?Day of Surgery: ? ?Take a shower with CHG soap. ?Wear Clean/Comfortable clothing the morning of surgery ?Do not apply any deodorants/lotions.   ?Remember to brush your teeth WITH YOUR REGULAR TOOTHPASTE. ? ? ?Please read over the following fact sheets that you were given.  ? ?

## 2021-08-02 ENCOUNTER — Other Ambulatory Visit: Payer: Self-pay

## 2021-08-02 ENCOUNTER — Encounter (HOSPITAL_COMMUNITY): Payer: Self-pay

## 2021-08-02 ENCOUNTER — Inpatient Hospital Stay (HOSPITAL_COMMUNITY)
Admission: RE | Admit: 2021-08-02 | Discharge: 2021-08-02 | Disposition: A | Discharge status: Home / Self Care | Payer: 59 | Source: Ambulatory Visit

## 2021-08-02 NOTE — Progress Notes (Signed)
Called pt, no show for PAT appt.  States he forgot and overslept.  Reviewed health history over the phone and provided instructions.  Will need lab work the day of surgery. ? ?PCP - Dr. Georganna Skeans ?Cardiologist - denies seeing anyone currently--reports seeing in the past but none currently ?EKG - 10-20-2020 ?Chest x-ray - 01-07-2021 ?ECHO - 08/18/2020 ?Cardiac Cath - denies ?CPAP - denies ? ?ERAS Protcol - clears until 10:30am ?COVID TEST- n/a--ambulatory ? ?Anesthesia review: no ? ?Reports NO Cocaine use since August 2022.  No smoking cigarettes since Saturday, Jul 28, 2021 and using a patch ? ?------------- ? ?SDW INSTRUCTIONS: ? ?Your procedure is scheduled on August 06, 2021. Please report to [facility] Main Entrance "A" at 11:00 A.M., and check in at the Admitting office. Call this number if you have problems the morning of surgery: [phone] ? ? ?Remember: Do not eat after midnight the night before your surgery ? ?You may drink clear liquids until 10:30 the morning of your surgery.   ?Clear liquids allowed are: Water, Non-Citrus Juices (without pulp), Carbonated Beverages, Clear Tea, Black Coffee Only, and Gatorade ?  ?Medications to take morning of surgery with a sip of water include: ?gabapentin (NEURONTIN) ?hydrOXYzine (ATARAX) ?methocarbamol (ROBAXIN)  ?  ?As needed: ?acetaminophen (TYLENOL)  ?albuterol (VENTOLIN HFA)-please bring with you to the hospital. ?oxyCODONE (OXY IR/ROXICODONE)  ? ?As of today, STOP taking any Aspirin (unless otherwise instructed by your surgeon), Aleve, Naproxen, Ibuprofen, Motrin, Advil, Goody's, BC's, all herbal medications, fish oil, and all vitamins. ? ?  ?The Morning of Surgery ?Do not wear jewelry, make-up or nail polish. ?Do not wear lotions, powders, or perfumes/colognes, or deodorant ?Do not bring valuables to the hospital. ?[facility] is not responsible for any belongings or valuables. ? ?If you are a smoker, DO NOT Smoke 24 hours prior to surgery ? ?If you wear a CPAP  at night please bring your mask the morning of surgery  ? ?Remember that you must have someone to transport you home after your surgery, and remain with you for 24 hours if you are discharged the same day. ? ?Please bring cases for contacts, glasses, hearing aids, dentures or bridgework because it cannot be worn into surgery.  ? ?Patients discharged the day of surgery will not be allowed to drive home.  ? ?Please shower the NIGHT BEFORE/MORNING OF SURGERY (use antibacterial soap like DIAL soap if possible). Wear comfortable clothes the morning of surgery. Oral Hygiene is also important to reduce your risk of infection.  Remember - BRUSH YOUR TEETH THE MORNING OF SURGERY WITH YOUR REGULAR TOOTHPASTE ? ?Patient denies shortness of breath, fever, cough and chest pain.  ? ? ?    ?

## 2021-08-06 ENCOUNTER — Other Ambulatory Visit: Payer: Self-pay

## 2021-08-06 ENCOUNTER — Encounter (HOSPITAL_COMMUNITY)
Admission: RE | Disposition: A | Discharge status: Home / Self Care | Payer: Self-pay | Source: Home / Self Care | Attending: Orthopaedic Surgery

## 2021-08-06 ENCOUNTER — Encounter (HOSPITAL_COMMUNITY): Payer: Self-pay | Admitting: Orthopaedic Surgery

## 2021-08-06 ENCOUNTER — Ambulatory Visit (HOSPITAL_BASED_OUTPATIENT_CLINIC_OR_DEPARTMENT_OTHER): Payer: 59 | Admitting: Certified Registered Nurse Anesthetist

## 2021-08-06 ENCOUNTER — Ambulatory Visit (HOSPITAL_COMMUNITY): Payer: 59 | Admitting: Certified Registered Nurse Anesthetist

## 2021-08-06 ENCOUNTER — Ambulatory Visit (HOSPITAL_COMMUNITY)
Admission: RE | Admit: 2021-08-06 | Discharge: 2021-08-06 | Disposition: A | Discharge status: Home / Self Care | Payer: 59 | Attending: Orthopaedic Surgery | Admitting: Orthopaedic Surgery

## 2021-08-06 DIAGNOSIS — J45909 Unspecified asthma, uncomplicated: Secondary | ICD-10-CM | POA: Insufficient documentation

## 2021-08-06 DIAGNOSIS — M25361 Other instability, right knee: Secondary | ICD-10-CM | POA: Diagnosis not present

## 2021-08-06 DIAGNOSIS — F17210 Nicotine dependence, cigarettes, uncomplicated: Secondary | ICD-10-CM | POA: Insufficient documentation

## 2021-08-06 DIAGNOSIS — K219 Gastro-esophageal reflux disease without esophagitis: Secondary | ICD-10-CM | POA: Insufficient documentation

## 2021-08-06 DIAGNOSIS — M24661 Ankylosis, right knee: Secondary | ICD-10-CM

## 2021-08-06 DIAGNOSIS — F419 Anxiety disorder, unspecified: Secondary | ICD-10-CM | POA: Insufficient documentation

## 2021-08-06 HISTORY — PX: KNEE ARTHROSCOPY: SHX127

## 2021-08-06 LAB — COMPREHENSIVE METABOLIC PANEL
ALT: 16 U/L (ref 0–44)
AST: 17 U/L (ref 15–41)
Albumin: 4.2 g/dL (ref 3.5–5.0)
Alkaline Phosphatase: 132 U/L — ABNORMAL HIGH (ref 38–126)
Anion gap: 10 (ref 5–15)
BUN: 10 mg/dL (ref 6–20)
CO2: 22 mmol/L (ref 22–32)
Calcium: 9.2 mg/dL (ref 8.9–10.3)
Chloride: 96 mmol/L — ABNORMAL LOW (ref 98–111)
Creatinine, Ser: 0.88 mg/dL (ref 0.61–1.24)
GFR, Estimated: >60 mL/min (ref >60)
Glucose, Bld: 79 mg/dL (ref 70–99)
Potassium: 4.3 mmol/L (ref 3.5–5.1)
Sodium: 128 mmol/L — ABNORMAL LOW (ref 135–145)
Total Bilirubin: 0.7 mg/dL (ref 0.3–1.2)
Total Protein: 6.8 g/dL (ref 6.5–8.1)

## 2021-08-06 LAB — CBC
HCT: 42.5 % (ref 39.0–52.0)
Hemoglobin: 14.3 g/dL (ref 13.0–17.0)
MCH: 31 pg (ref 26.0–34.0)
MCHC: 33.6 g/dL (ref 30.0–36.0)
MCV: 92.2 fL (ref 80.0–100.0)
Platelets: 359 10*3/uL (ref 150–400)
RBC: 4.61 MIL/uL (ref 4.22–5.81)
RDW: 12.6 % (ref 11.5–15.5)
WBC: 5.8 10*3/uL (ref 4.0–10.5)
nRBC: 0 % (ref 0.0–0.2)

## 2021-08-06 SURGERY — ARTHROSCOPY, KNEE
Anesthesia: General | Site: Knee | Laterality: Right

## 2021-08-06 MED ORDER — MIDAZOLAM HCL 2 MG/2ML IJ SOLN
INTRAMUSCULAR | Status: AC
  Administered 2021-08-06: 2 mg via INTRAVENOUS
  Filled 2021-08-06: qty 2

## 2021-08-06 MED ORDER — SODIUM CHLORIDE 0.9 % IR SOLN
Status: DC | PRN
  Administered 2021-08-06: 3000 mL

## 2021-08-06 MED ORDER — ROPIVACAINE HCL 5 MG/ML IJ SOLN
INTRAMUSCULAR | Status: DC | PRN
  Administered 2021-08-06: 30 mL via PERINEURAL

## 2021-08-06 MED ORDER — MIDAZOLAM HCL 2 MG/2ML IJ SOLN
INTRAMUSCULAR | Status: AC
  Filled 2021-08-06: qty 2

## 2021-08-06 MED ORDER — GABAPENTIN 300 MG PO CAPS
300.0000 mg | ORAL_CAPSULE | Freq: Once | ORAL | Status: AC
  Administered 2021-08-06: 300 mg via ORAL
  Filled 2021-08-06: qty 1

## 2021-08-06 MED ORDER — CEFAZOLIN SODIUM-DEXTROSE 2-4 GM/100ML-% IV SOLN
2.0000 g | INTRAVENOUS | Status: AC
  Administered 2021-08-06: 2 g via INTRAVENOUS
  Filled 2021-08-06: qty 100

## 2021-08-06 MED ORDER — OXYCODONE HCL 5 MG/5ML PO SOLN: 5.0000 mg | Freq: Once | ORAL | Status: AC | PRN

## 2021-08-06 MED ORDER — FENTANYL CITRATE (PF) 100 MCG/2ML IJ SOLN
INTRAMUSCULAR | Status: AC
  Filled 2021-08-06: qty 2

## 2021-08-06 MED ORDER — FENTANYL CITRATE (PF) 100 MCG/2ML IJ SOLN: 100.0000 ug | Freq: Once | INTRAMUSCULAR | Status: AC

## 2021-08-06 MED ORDER — ONDANSETRON HCL 4 MG/2ML IJ SOLN
INTRAMUSCULAR | Status: AC
  Filled 2021-08-06: qty 2

## 2021-08-06 MED ORDER — ACETAMINOPHEN 500 MG PO TABS
1000.0000 mg | ORAL_TABLET | Freq: Once | ORAL | Status: DC
  Filled 2021-08-06: qty 2

## 2021-08-06 MED ORDER — GLYCOPYRROLATE PF 0.2 MG/ML IJ SOSY
PREFILLED_SYRINGE | INTRAMUSCULAR | Status: AC
  Filled 2021-08-06: qty 1

## 2021-08-06 MED ORDER — KETOROLAC TROMETHAMINE 30 MG/ML IJ SOLN
INTRAMUSCULAR | Status: AC
  Filled 2021-08-06: qty 1

## 2021-08-06 MED ORDER — CHLORHEXIDINE GLUCONATE 0.12 % MT SOLN
15.0000 mL | Freq: Once | OROMUCOSAL | Status: AC
  Administered 2021-08-06: 15 mL via OROMUCOSAL
  Filled 2021-08-06: qty 15

## 2021-08-06 MED ORDER — ONDANSETRON HCL 4 MG/2ML IJ SOLN: 4.0000 mg | Freq: Once | INTRAMUSCULAR | Status: DC | PRN

## 2021-08-06 MED ORDER — FENTANYL CITRATE (PF) 100 MCG/2ML IJ SOLN
INTRAMUSCULAR | Status: AC
  Administered 2021-08-06: 100 ug via INTRAVENOUS
  Filled 2021-08-06: qty 2

## 2021-08-06 MED ORDER — HYDROMORPHONE HCL 1 MG/ML IJ SOLN
0.2500 mg | INTRAMUSCULAR | Status: DC | PRN
  Administered 2021-08-06 (×2): 0.5 mg via INTRAVENOUS

## 2021-08-06 MED ORDER — DEXAMETHASONE SODIUM PHOSPHATE 10 MG/ML IJ SOLN
INTRAMUSCULAR | Status: AC
  Filled 2021-08-06: qty 1

## 2021-08-06 MED ORDER — GLYCOPYRROLATE PF 0.2 MG/ML IJ SOSY
PREFILLED_SYRINGE | INTRAMUSCULAR | Status: DC | PRN
  Administered 2021-08-06 (×2): .1 mg via INTRAVENOUS

## 2021-08-06 MED ORDER — SODIUM CHLORIDE 0.9 % IR SOLN
Status: DC | PRN
  Administered 2021-08-06: 1000 mL

## 2021-08-06 MED ORDER — KETOROLAC TROMETHAMINE 30 MG/ML IJ SOLN
30.0000 mg | Freq: Once | INTRAMUSCULAR | Status: AC | PRN
  Administered 2021-08-06: 30 mg via INTRAVENOUS

## 2021-08-06 MED ORDER — HYDROMORPHONE HCL 1 MG/ML IJ SOLN
INTRAMUSCULAR | Status: AC
  Filled 2021-08-06: qty 1

## 2021-08-06 MED ORDER — LACTATED RINGERS IV SOLN: INTRAVENOUS | Status: DC | PRN

## 2021-08-06 MED ORDER — PHENYLEPHRINE 40 MCG/ML (10ML) SYRINGE FOR IV PUSH (FOR BLOOD PRESSURE SUPPORT)
PREFILLED_SYRINGE | INTRAVENOUS | Status: AC
  Filled 2021-08-06: qty 20

## 2021-08-06 MED ORDER — DEXAMETHASONE SODIUM PHOSPHATE 10 MG/ML IJ SOLN
INTRAMUSCULAR | Status: DC | PRN
  Administered 2021-08-06: 10 mg via INTRAVENOUS

## 2021-08-06 MED ORDER — LIDOCAINE 2% (20 MG/ML) 5 ML SYRINGE
INTRAMUSCULAR | Status: DC | PRN
  Administered 2021-08-06: 100 mg via INTRAVENOUS

## 2021-08-06 MED ORDER — PROPOFOL 10 MG/ML IV BOLUS
INTRAVENOUS | Status: AC
  Filled 2021-08-06: qty 20

## 2021-08-06 MED ORDER — OXYCODONE HCL 5 MG PO TABS
ORAL_TABLET | ORAL | Status: AC
  Filled 2021-08-06: qty 1

## 2021-08-06 MED ORDER — DEXMEDETOMIDINE (PRECEDEX) IN NS 20 MCG/5ML (4 MCG/ML) IV SYRINGE
PREFILLED_SYRINGE | INTRAVENOUS | Status: DC | PRN
  Administered 2021-08-06 (×2): 8 ug via INTRAVENOUS
  Administered 2021-08-06: 4 ug via INTRAVENOUS

## 2021-08-06 MED ORDER — EPINEPHRINE PF 1 MG/ML IJ SOLN
INTRAMUSCULAR | Status: AC
  Filled 2021-08-06: qty 2

## 2021-08-06 MED ORDER — MIDAZOLAM HCL 2 MG/2ML IJ SOLN
2.0000 mg | Freq: Once | INTRAMUSCULAR | Status: AC
Start: 2021-08-06 — End: 2021-08-06

## 2021-08-06 MED ORDER — OXYCODONE HCL 5 MG PO TABS
5.0000 mg | ORAL_TABLET | Freq: Once | ORAL | Status: AC | PRN
  Administered 2021-08-06: 5 mg via ORAL

## 2021-08-06 MED ORDER — TRANEXAMIC ACID-NACL 1000-0.7 MG/100ML-% IV SOLN
1000.0000 mg | INTRAVENOUS | Status: AC
  Administered 2021-08-06: 1000 mg via INTRAVENOUS
  Filled 2021-08-06: qty 100

## 2021-08-06 MED ORDER — PROPOFOL 10 MG/ML IV BOLUS
INTRAVENOUS | Status: DC | PRN
  Administered 2021-08-06: 200 mg via INTRAVENOUS

## 2021-08-06 MED ORDER — ONDANSETRON HCL 4 MG/2ML IJ SOLN
INTRAMUSCULAR | Status: DC | PRN
Start: 2021-08-06 — End: 2021-08-06
  Administered 2021-08-06: 4 mg via INTRAVENOUS

## 2021-08-06 MED ORDER — LACTATED RINGERS IV SOLN: INTRAVENOUS | Status: DC

## 2021-08-06 MED ORDER — EPHEDRINE SULFATE-NACL 50-0.9 MG/10ML-% IV SOSY
PREFILLED_SYRINGE | INTRAVENOUS | Status: DC | PRN
  Administered 2021-08-06: 5 mg via INTRAVENOUS
  Administered 2021-08-06: 10 mg via INTRAVENOUS
  Administered 2021-08-06: 5 mg via INTRAVENOUS

## 2021-08-06 MED ORDER — LIDOCAINE 2% (20 MG/ML) 5 ML SYRINGE
INTRAMUSCULAR | Status: AC
  Filled 2021-08-06: qty 5

## 2021-08-06 SURGICAL SUPPLY — 61 items
ANCHOR SUT FBRTK 2.6 SOFT 1.7 (Anchor) ×2 IMPLANT
BAG COUNTER SPONGE SURGICOUNT (BAG) ×2 IMPLANT
BAG SURGICOUNT SPONGE COUNTING (BAG) ×1
BLADE CLIPPER SURG (BLADE) ×2 IMPLANT
BLADE EXCALIBUR 4.0MM X 13CM (MISCELLANEOUS) ×1
BLADE EXCALIBUR 4.0X13 (MISCELLANEOUS) ×2 IMPLANT
BNDG ELASTIC 6X10 VLCR STRL LF (GAUZE/BANDAGES/DRESSINGS) IMPLANT
BNDG ELASTIC 6X5.8 VLCR STR LF (GAUZE/BANDAGES/DRESSINGS) ×2 IMPLANT
CHLORAPREP W/TINT 26 (MISCELLANEOUS) ×3 IMPLANT
COOLER ICEMAN CLASSIC (MISCELLANEOUS) ×3 IMPLANT
COVER SURGICAL LIGHT HANDLE (MISCELLANEOUS) ×3 IMPLANT
CUFF TOURN SGL QUICK 34 (TOURNIQUET CUFF) ×3
CUFF TRNQT CYL 34X4.125X (TOURNIQUET CUFF) IMPLANT
DRAPE ARTHROSCOPY W/POUCH 114 (DRAPES) ×3 IMPLANT
DRAPE U-SHAPE 47X51 STRL (DRAPES) ×3 IMPLANT
DRSG TEGADERM 4X4.75 (GAUZE/BANDAGES/DRESSINGS) ×3 IMPLANT
DRSG XEROFORM 1X8 (GAUZE/BANDAGES/DRESSINGS) ×2 IMPLANT
DW OUTFLOW CASSETTE/TUBE SET (MISCELLANEOUS) ×3 IMPLANT
GAUZE SPONGE 4X4 12PLY STRL (GAUZE/BANDAGES/DRESSINGS) ×2 IMPLANT
GAUZE XEROFORM 1X8 LF (GAUZE/BANDAGES/DRESSINGS) IMPLANT
GLOVE SRG 8 PF TXTR STRL LF DI (GLOVE) ×1 IMPLANT
GLOVE SURG ENC MOIS LTX SZ6 (GLOVE) ×9 IMPLANT
GLOVE SURG LTX SZ8 (GLOVE) ×6 IMPLANT
GLOVE SURG SYN 7.5  E (GLOVE) ×4
GLOVE SURG SYN 7.5 E (GLOVE) ×2 IMPLANT
GLOVE SURG SYN 7.5 PF PI (GLOVE) ×2 IMPLANT
GLOVE SURG UNDER POLY LF SZ6.5 (GLOVE) ×6 IMPLANT
GLOVE SURG UNDER POLY LF SZ8 (GLOVE) ×2
GOWN STRL REUS W/ TWL LRG LVL3 (GOWN DISPOSABLE) ×2 IMPLANT
GOWN STRL REUS W/ TWL XL LVL3 (GOWN DISPOSABLE) ×1 IMPLANT
GOWN STRL REUS W/TWL LRG LVL3 (GOWN DISPOSABLE) ×4
GOWN STRL REUS W/TWL XL LVL3 (GOWN DISPOSABLE) ×2
KIT ANCHOR FBRTK 2.6 STR (KITS) ×2 IMPLANT
KIT BASIN OR (CUSTOM PROCEDURE TRAY) ×3 IMPLANT
KIT TURNOVER KIT B (KITS) ×3 IMPLANT
MANIFOLD NEPTUNE II (INSTRUMENTS) ×4 IMPLANT
NDL 18GX1X1/2 (RX/OR ONLY) (NEEDLE) IMPLANT
NDL HYPO 25GX1X1/2 BEV (NEEDLE) ×1 IMPLANT
NDL TAPERED W/ NITINOL LOOP (MISCELLANEOUS) IMPLANT
NEEDLE 18GX1X1/2 (RX/OR ONLY) (NEEDLE) ×3 IMPLANT
NEEDLE HYPO 25GX1X1/2 BEV (NEEDLE) ×3 IMPLANT
NEEDLE TAPERED W/ NITINOL LOOP (MISCELLANEOUS) ×3 IMPLANT
NS IRRIG 1000ML POUR BTL (IV SOLUTION) ×2 IMPLANT
PACK ARTHROSCOPY DSU (CUSTOM PROCEDURE TRAY) ×3 IMPLANT
PAD ARMBOARD 7.5X6 YLW CONV (MISCELLANEOUS) ×6 IMPLANT
PADDING CAST COTTON 6X4 STRL (CAST SUPPLIES) ×3 IMPLANT
PORT APPOLLO RF 90DEGREE MULTI (SURGICAL WAND) ×2 IMPLANT
SPONGE T-LAP 4X18 ~~LOC~~+RFID (SPONGE) ×3 IMPLANT
SUT ETHILON 3 0 PS 1 (SUTURE) ×5 IMPLANT
SUT VIC AB 0 CT1 27 (SUTURE) ×4
SUT VIC AB 0 CT1 27XBRD ANBCTR (SUTURE) IMPLANT
SUT VIC AB 2-0 CT1 27 (SUTURE) ×6
SUT VIC AB 2-0 CT1 TAPERPNT 27 (SUTURE) IMPLANT
SYR 20ML ECCENTRIC (SYRINGE) ×3 IMPLANT
SYR TB 1ML LUER SLIP (SYRINGE) ×3 IMPLANT
TOWEL GREEN STERILE (TOWEL DISPOSABLE) ×3 IMPLANT
TOWEL GREEN STERILE FF (TOWEL DISPOSABLE) ×3 IMPLANT
TUBE CONNECTING 12'X1/4 (SUCTIONS) ×1
TUBE CONNECTING 12X1/4 (SUCTIONS) ×2 IMPLANT
TUBING ARTHROSCOPY IRRIG 16FT (MISCELLANEOUS) ×3 IMPLANT
WATER STERILE IRR 1000ML POUR (IV SOLUTION) ×3 IMPLANT

## 2021-08-06 NOTE — Brief Op Note (Signed)
? ?  Brief Op Note ? ?Date of Surgery: ?08/06/2021 ? ?Preoperative Diagnosis: ?Right knee Arthrofibrosis ? ?Postoperative Diagnosis: ?same ? ?Procedure: ?Procedure(s): ?RIGHT KNEE ARTHROSCOPIC LYSIS OF ADHESIONS AND MUNIPULATION UNDER ANESTHESIA / POSSIBLE LATERAL EXRA-ARTICULAR TENODESIS ? ?Implants: ?Implant Name Type Inv. Item Serial No. Manufacturer Lot No. LRB No. Used Action  ?ANCHOR SUT FBRTK 2.6 SOFT 1.7 - [id number] Anchor ANCHOR SUT FBRTK 2.6 SOFT 1.7  [employer] [id number] Right 1 Implanted  ? ? ?Surgeons: ?Surgeon(s): ?Vanetta Mulders, MD ? ?Anesthesia: ?Regional ? ? ? ?Estimated Blood Loss: ?See anesthesia record ? ?Complications: ?None ? ?Condition to PACU: ?Stable ? ?Yevonne Pax, MD ?08/06/2021 ?3:23 PM ? ?

## 2021-08-06 NOTE — Anesthesia Postprocedure Evaluation (Signed)
Anesthesia Post Note ? ?Patient: Kenneth Bautista ? ?Procedure(s) Performed: RIGHT KNEE ARTHROSCOPIC LYSIS OF ADHESIONS AND MUNIPULATION UNDER ANESTHESIA / POSSIBLE LATERAL EXRA-ARTICULAR TENODESIS (Right: Knee) ? ?  ? ?Patient location during evaluation: PACU ?Anesthesia Type: General ?Level of consciousness: awake and alert ?Pain management: pain level controlled ?Vital Signs Assessment: post-procedure vital signs reviewed and stable ?Respiratory status: spontaneous breathing, nonlabored ventilation, respiratory function stable and patient connected to nasal cannula oxygen ?Cardiovascular status: blood pressure returned to baseline and stable ?Postop Assessment: no apparent nausea or vomiting ?Anesthetic complications: no ? ? ?No notable events documented. ? ?Last Vitals:  ?Vitals:  ? 08/06/21 1540 08/06/21 1555  ?BP: (!) 143/85 (!) 151/94  ?Pulse: 81 (!) 59  ?Resp: 18 15  ?Temp:  36.6 ?C  ?SpO2: 96% 97%  ?  ?Last Pain:  ?Vitals:  ? 08/06/21 1555  ?TempSrc:   ?PainSc: Asleep  ? ? ?  ?  ?  ?  ?  ?  ? ?Tim Corriher L Zakiyah Diop ? ? ? ? ?

## 2021-08-06 NOTE — Anesthesia Procedure Notes (Signed)
Procedure Name: LMA Insertion ?Date/Time: 08/06/2021 1:52 PM ?Performed by: Macie Burows, CRNA ?Pre-anesthesia Checklist: Patient identified, Emergency Drugs available, Suction available, Patient being monitored and Timeout performed ?Patient Re-evaluated:Patient Re-evaluated prior to induction ?Oxygen Delivery Method: Circle system utilized ?Preoxygenation: Pre-oxygenation with 100% oxygen ?Induction Type: IV induction ?Ventilation: Mask ventilation without difficulty ?LMA: LMA inserted ?LMA Size: 5.0 ?Placement Confirmation: positive ETCO2 and breath sounds checked- equal and bilateral ?Tube secured with: Tape ?Dental Injury: Teeth and Oropharynx as per pre-operative assessment  ? ? ? ? ?

## 2021-08-06 NOTE — Progress Notes (Signed)
Spoke to Dr. Okey Dupre regarding HR ranging 42-46.  Will continue to monitor ?

## 2021-08-06 NOTE — Anesthesia Procedure Notes (Signed)
Anesthesia Procedure Image    

## 2021-08-06 NOTE — Discharge Instructions (Signed)
? ? ?   Discharge Instructions  ? ? ?Attending Surgeon: Huel Cote, MD ?Office Phone Number: [phone] ? ? ?Diagnosis and Procedures:   ? ?Surgeries Performed: ?Right knee extensive debridement with lateral extra-articular tenodesis ? ?Discharge Plan:  ? ? ?Diet: ?Resume usual diet. Begin with light or bland foods.  Drink plenty of fluids. ? ?Activity:  ?Weight bearing and activity as tolerated. Please keep your brace locked until follow-up. ?You are advised to go home directly from the hospital or surgical center. Restrict your activities. ? ?GENERAL INSTRUCTIONS: ?1.  Keep your surgical site elevated above your heart for at least 5-7 days or longer to prevent swelling. This will improve your comfort and your overall recovery following surgery.   ?  ?2. Please call Dr. Serena Croissant office at [phone] with questions Monday-Friday during business hours. If no one answers, please leave a message and someone should get back to the patient within 24 hours. For emergencies please call 911 or proceed to the emergency room.  ? ?3. Patient to notify surgical team if experiences any of the following: Bowel/Bladder dysfunction, uncontrolled pain, nerve/muscle weakness, incision with increased drainage or redness, nausea/vomiting and Fever greater than 101.0 F.  Be alert for signs of infection including redness, streaking, odor, fever or chills. Be alert for excessive pain or bleeding and notify your surgeon immediately. ? ?WOUND INSTRUCTIONS:   ?Leave your dressing/cast/splint in place until your post operative visit.  Keep it clean and dry. ? ?Always keep the incision clean and dry until the staples/sutures are removed. If there is no drainage from the incision you should keep it open to air. If there is drainage from the incision you must keep it covered at all times until the drainage stops ? Do not soak in a bath tub, hot tub, pool, lake or other body of water until 21 days after your surgery and your incision is  completely dry and healed.  ?If you have removable sutures (or staples) they must be removed 10-14 days (unless otherwise instructed) from the day of your surgery.  ? ? ? 1)  Elevate the extremity as much as possible. ? 2)  Keep the dressing clean and dry. ? 3)  Please call us if the dressing becomes wet or dirty. ? 4)  If you are experiencing worsening pain or worsening swelling, please call. ?  ?  ?MEDICATIONS: ?Resume all previous home medications at the previous prescribed dose and frequency unless otherwise noted ?Start taking the  pain medications on an as-needed basis as prescribed  ?Please taper down pain medication over the next week following surgery.  Ideally you should not require a refill of any narcotic pain medication.  ?Take pain medication with food to minimize nausea. ?In addition to the prescribed pain medication, you may take over-the-counter pain relievers such as Tylenol.  Do NOT take additional tylenol if your pain medication already has tylenol in it.  ?Aspirin 325mg  daily for four weeks. ? ?  ?  ?FOLLOWUP INSTRUCTIONS: ?1. Follow up at the Physical Therapy Clinic 3-4 days following surgery. This appointment should be scheduled unless other arrangements have been made.The Physical Therapy scheduling number is [phone] if an appointment has not already been arranged. ? ?2. Contact Dr. [phone] office during office hours at [phone] or the practice after hours line at [phone] for non-emergencies. For medical emergencies call 911. ? ? ?Discharge Location: Home  ?

## 2021-08-06 NOTE — Anesthesia Procedure Notes (Signed)
Anesthesia Regional Block: Adductor canal block  ? ?Pre-Anesthetic Checklist: , timeout performed,  Correct Patient, Correct Site, Correct Laterality,  Correct Procedure, Correct Position, site marked,  Risks and benefits discussed,  Surgical consent,  Pre-op evaluation,  At surgeon's request and post-op pain management ? ?Laterality: Right ? ?Prep: chloraprep     ?  ?Needles:  ?Injection technique: Single-shot ? ?Needle Type: Echogenic Needle   ? ? ?Needle Length: 9cm  ? ? ? ? ?Additional Needles: ? ? ?Procedures:,,,, ultrasound used (permanent image in chart),,    ?Narrative:  ?Start time: 08/06/2021 1:00 PM ?End time: 08/06/2021 1:06 PM ?Injection made incrementally with aspirations every 5 mL. ? ?Performed by: Personally  ?Anesthesiologist: Myrtie Soman, MD ? ?Additional Notes: ?Patient tolerated the procedure well without complications ? ? ? ?

## 2021-08-06 NOTE — Interval H&P Note (Signed)
History and Physical Interval Note: ? ?08/06/2021 ?1:08 PM ? ?Kenneth Bautista  has presented today for surgery, with the diagnosis of Right knee Arthrofibrosis.  The various methods of treatment have been discussed with the patient and family. After consideration of risks, benefits and other options for treatment, the patient has consented to  Procedure(s): ?RIGHT KNEE ARTHROSCOPIC LYSIS OF ADHESIONS AND MUNIPULATION UNDER ANESTHESIA / POSSIBLE LATERAL EXRA-ARTICULAR TENODESIS (Right) as a surgical intervention.  The patient's history has been reviewed, patient examined, no change in status, stable for surgery.  I have reviewed the patient's chart and labs.  Questions were answered to the patient's satisfaction.   ? ? ?Huel Cote ? ? ?

## 2021-08-06 NOTE — Op Note (Signed)
? ?Date of Surgery: 08/06/2021 ? ?INDICATIONS: Mr. Gay is a 36 y.o.-year-old male status post right knee multiligamentous reconstruction with arthrofibrosis as well as residual anterior lateral instability.  The risk and benefits of the procedure with discussed in detail and documented in the pre-operative evaluation. ? ?PREOPERATIVE DIAGNOSIS: 1.  Right knee arthrofibrosis ?2.  Right knee anterior lateral instability ? ?POSTOPERATIVE DIAGNOSIS: Same. ? ?PROCEDURE: 1.  Right knee lysis of adhesions with manipulation under anesthesia, extensive synovectomy ?2.  Right knee lateral extra-articular tenodesis with modified Siri Cole procedure ? ?SURGEON: Yevonne Pax MD ? ?ASSISTANT: Metro Kung ? ?ANESTHESIA:  general ? ?IV FLUIDS AND URINE: See anesthesia record. ? ?ANTIBIOTICS: Ancef 2 g ? ?ESTIMATED BLOOD LOSS: 10 mL. ? ?IMPLANTS:  ?Implant Name Type Inv. Item Serial No. Manufacturer Lot No. LRB No. Used Action  ?ANCHOR SUT FBRTK 2.6 SOFT 1.7 - [id number] Anchor ANCHOR SUT FBRTK 2.6 SOFT 1.7  [employer] [id number] Right 1 Implanted  ? ? ?DRAINS: None ? ?CULTURES: None ? ?COMPLICATIONS: none ? ?DESCRIPTION OF PROCEDURE:  ?Examination under anesthesia: A careful examination under anesthesia was performed.  Knee ROM motion was: 2-90 ?Lachman: Normal ?Pivot Shift: Normal ?Posterior drawer: normal.   ?Varus stability in full extension: Grade 2 opening ?Varus stability in 30 degrees of flexion: Grade 2 opening ?Positive anterior lateral instability rotationally ?Valgus stability in full extension: normal.   ?Valgus stability in 30 degrees of flexion: normal.  ?Posterolateral drawer: normal ?  ?Intra-operative findings: A thorough arthroscopic examination of the knee was performed.  The findings are: ?1. Suprapatellar pouch: Significant arthrofibrosis ?2. Undersurface of median ridge: Normal ?3. Medial patellar facet: Normal ?4. Lateral patellar facet: Normal ?5. Trochlea: Normal ?6. Lateral gutter/popliteus tendon:  Significant arthrofibrosis ?7. Hoffa's fat pad: Hypertrophy ?8. Medial gutter/plica: Significant arthrofibrosis ?9. ACL: Intact graft ?10. PCL: Intact ?11. Medial meniscus: Mild fraying without discrete tear ?12. Medial compartment cartilage: Grade 3 cartilage loss involving medial tibial plateau ?13. Lateral meniscus: Normal ?14. Lateral compartment cartilage: Normal ? ?The patient was identified in the preoperative holding area.  The correct site was marked according to universal protocol with nursing.  He is subsequently taken back to the operating room.  Ancef was given 1 hour prior to skin incision.  He was transferred to the operating room table.  Anesthesia was induced.  He was prepped and draped in usual sterile fashion.  Final timeout was performed.  We began with a standard diagnostic arthroscopy with an anterior lateral portal using 11 blade to incise through skin.  The camera was introduced into the knee and the above findings were seen.  At this time a medial portal was established with spinal needle.  An anterior medial outflow portal was also established to aid in visualization.  At this point a shaver was introduced into the knee and used to gently debride in combination with radiofrequency ablation.  In total extensive synovectomy was performed with lysis of adhesions including medial gutter lateral gutter, prepatellar Hoffa fat pad, suprapatellar pouch all using electric frequency wand.  Following this the arthroscopic fluid was evacuated from the knee and manipulation was performed.  The knee was able to be flexed to approximately 130 degrees without difficulty. ? ?Next, attention was turned to dissection for the LET. A 5cm incision from Gerdy's tubercle to the lateral epicondyle was made. The incision was taken down to the level of the ITB. Overlying fascia tissue was cleared. The posterior border of the ITB was identified and protected. A central  portion of the ITB band measuring 87mm x 66mm was  harvested with the distal end remaining attached to Gerdy's tubercle. Next, the lateral epicondyle was identified and cleared of overlying soft tissue, and prepared with a rasp and rongeur to create bleeding bone..  A fiber tack RC anchor was in the anatomic footprint of the epicondyle.  The slip of the IT band was passed below the previous lateral collateral ligament graft and one limb of the suture anchor was passed in a locking fashion to the graft after checking for graft isometry.  This was then tied down.  The other limb of the anchor was then tied through the tendon.  A #1 Vicryl was used finally to close the overlying fascia of the epicondyle through this graft. ? ? ?Following this there was no additional residual rotary instability.  Anterior drawer with thumb on the lateral tibial plateau was completely negative.  The wound was copiously irrigated. The ITB was closed with 0-vicryl suture. The incision was then closed in layers with 0-vicryl, 2-0 vicryl, and 3-0 nylon.  ? ? ? ?POSTOPERATIVE PLAN: He will be weightbearing as tolerated and range of motion as tolerated on the right leg.  We will have him participate in physical therapy immediately.  We will see him back in 2 weeks for wound check and suture removal ? ?Yevonne Pax, MD ?3:24 PM ? ? ? ?

## 2021-08-06 NOTE — Transfer of Care (Signed)
Immediate Anesthesia Transfer of Care Note ? ?Patient: Kenneth Bautista ? ?Procedure(s) Performed: RIGHT KNEE ARTHROSCOPIC LYSIS OF ADHESIONS AND MUNIPULATION UNDER ANESTHESIA / POSSIBLE LATERAL EXRA-ARTICULAR TENODESIS (Right: Knee) ? ?Patient Location: PACU ? ?Anesthesia Type:General ? ?Level of Consciousness: awake, alert  and oriented ? ?Airway & Oxygen Therapy: Patient Spontanous Breathing ? ?Post-op Assessment: Report given to RN and Post -op Vital signs reviewed and stable ? ?Post vital signs: Reviewed and stable ? ?Last Vitals:  ?Vitals Value Taken Time  ?BP 149/63 08/06/21 1525  ?Temp 36.6 ?C 08/06/21 1524  ?Pulse 66 08/06/21 1528  ?Resp 21 08/06/21 1528  ?SpO2 99 % 08/06/21 1528  ?Vitals shown include unvalidated device data. ? ?Last Pain:  ?Vitals:  ? 08/06/21 1029  ?TempSrc:   ?PainSc: 5   ?   ? ?  ? ?Complications: No notable events documented. ?

## 2021-08-06 NOTE — Anesthesia Preprocedure Evaluation (Signed)
Anesthesia Evaluation  ?Patient identified by MRN, date of birth, ID band ?Patient awake ? ? ? ?Reviewed: ?Allergy & Precautions, NPO status , Patient's Chart, lab work & pertinent test results ? ?Airway ?Mallampati: II ? ?TM Distance: >3 FB ?Neck ROM: Full ? ? ? Dental ?no notable dental hx. ? ?  ?Pulmonary ?asthma , Current Smoker and Patient abstained from smoking.,  ?  ?Pulmonary exam normal ?breath sounds clear to auscultation ? ? ? ? ? ? Cardiovascular ?negative cardio ROS ?Normal cardiovascular exam ?Rhythm:Regular Rate:Normal ? ? ?  ?Neuro/Psych ?Anxiety negative neurological ROS ?   ? GI/Hepatic ?GERD  ,(+)  ?  ? substance abuse ? alcohol use, cocaine use and methamphetamine use,   ?Endo/Other  ?negative endocrine ROS ? Renal/GU ?negative Renal ROS  ?negative genitourinary ?  ?Musculoskeletal ?negative musculoskeletal ROS ?(+)  ? Abdominal ?  ?Peds ?negative pediatric ROS ?(+)  Hematology ?negative hematology ROS ?(+)   ?Anesthesia Other Findings ? ? Reproductive/Obstetrics ?negative OB ROS ? ?  ? ? ? ? ? ? ? ? ? ? ? ? ? ?  ?  ? ? ? ? ? ? ? ? ?Anesthesia Physical ?Anesthesia Plan ? ?ASA: 3 ? ?Anesthesia Plan: General  ? ?Post-op Pain Management: Dilaudid IV  ? ?Induction: Intravenous ? ?PONV Risk Score and Plan: 1 and Ondansetron, Dexamethasone, Midazolam and Treatment may vary due to age or medical condition ? ?Airway Management Planned: LMA ? ?Additional Equipment:  ? ?Intra-op Plan:  ? ?Post-operative Plan: Extubation in OR ? ?Informed Consent: I have reviewed the patients History and Physical, chart, labs and discussed the procedure including the risks, benefits and alternatives for the proposed anesthesia with the patient or authorized representative who has indicated his/her understanding and acceptance.  ? ? ? ?Dental advisory given ? ?Plan Discussed with: CRNA and Surgeon ? ?Anesthesia Plan Comments:   ? ? ? ? ? ? ?Anesthesia Quick Evaluation ? ?

## 2021-08-09 ENCOUNTER — Ambulatory Visit (HOSPITAL_BASED_OUTPATIENT_CLINIC_OR_DEPARTMENT_OTHER): Payer: 59 | Attending: Orthopaedic Surgery | Admitting: Physical Therapy

## 2021-08-09 ENCOUNTER — Other Ambulatory Visit: Payer: Self-pay

## 2021-08-09 ENCOUNTER — Encounter (HOSPITAL_BASED_OUTPATIENT_CLINIC_OR_DEPARTMENT_OTHER): Payer: Self-pay | Admitting: Physical Therapy

## 2021-08-09 DIAGNOSIS — M24661 Ankylosis, right knee: Secondary | ICD-10-CM | POA: Insufficient documentation

## 2021-08-09 DIAGNOSIS — R262 Difficulty in walking, not elsewhere classified: Secondary | ICD-10-CM | POA: Insufficient documentation

## 2021-08-09 DIAGNOSIS — M25561 Pain in right knee: Secondary | ICD-10-CM | POA: Diagnosis present

## 2021-08-09 DIAGNOSIS — M6281 Muscle weakness (generalized): Secondary | ICD-10-CM | POA: Diagnosis present
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
## 2021-08-11 ENCOUNTER — Other Ambulatory Visit: Payer: Self-pay | Admitting: Physical Medicine & Rehabilitation

## 2021-08-13 ENCOUNTER — Other Ambulatory Visit: Payer: Self-pay

## 2021-08-13 ENCOUNTER — Ambulatory Visit (INDEPENDENT_AMBULATORY_CARE_PROVIDER_SITE_OTHER): Payer: 59 | Admitting: Licensed Clinical Social Worker

## 2021-08-13 ENCOUNTER — Encounter (HOSPITAL_BASED_OUTPATIENT_CLINIC_OR_DEPARTMENT_OTHER): Payer: Self-pay | Admitting: Physical Therapy

## 2021-08-13 ENCOUNTER — Ambulatory Visit (HOSPITAL_BASED_OUTPATIENT_CLINIC_OR_DEPARTMENT_OTHER): Payer: 59 | Admitting: Physical Therapy

## 2021-08-13 DIAGNOSIS — F32A Depression, unspecified: Secondary | ICD-10-CM

## 2021-08-13 DIAGNOSIS — R262 Difficulty in walking, not elsewhere classified: Secondary | ICD-10-CM

## 2021-08-13 DIAGNOSIS — M6281 Muscle weakness (generalized): Secondary | ICD-10-CM

## 2021-08-13 DIAGNOSIS — M25561 Pain in right knee: Secondary | ICD-10-CM | POA: Diagnosis not present

## 2021-08-13 DIAGNOSIS — F419 Anxiety disorder, unspecified: Secondary | ICD-10-CM | POA: Diagnosis not present

## 2021-08-13 DIAGNOSIS — F4323 Adjustment disorder with mixed anxiety and depressed mood: Secondary | ICD-10-CM

## 2021-08-13 NOTE — Plan of Care (Signed)
Pt decrease PHQ-9 from 17 to 10. Goal is below 10.  ?
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
## 2021-08-15 ENCOUNTER — Ambulatory Visit: Payer: Self-pay | Admitting: *Deleted

## 2021-08-15 ENCOUNTER — Telehealth: Payer: Self-pay

## 2021-08-15 NOTE — Telephone Encounter (Signed)
?  Chief Complaint: possible suture open ?Symptoms: pain, dressing feels wet ?Frequency: today- patient feels he may have openned wound due to activity ?Pertinent Negatives: Patient denies active bleeding through dressing ?Disposition: [x] ED /[] Urgent Care (no appt availability in office) / [] Appointment(In office/virtual)/ []  [facility]/ [] Home Care/ [] Refused Recommended Disposition /[] [facility]/ []  Follow-up with PCP ?Additional Notes: Patient advised follow up with surgeon- patient does not change his own dressing- so he is not sure of wound status- advised ED if he can not reach surgeon.   ?

## 2021-08-15 NOTE — Telephone Encounter (Signed)
Patient called and states he had surgery on 08/06/2021- Dr. Steward Drone (RIGHT KNEE ARTHROSCOPIC LYSIS OF ADHESIONS AND MUNIPULATION UNDER ANESTHESIA / POSSIBLE LATERAL EXRA-ARTICULAR TENODESIS) ? ?States he is doing well. Only concern he has is that he feels like his bandage is wet underneath. Would like to know what do? ? ? ?CB: [phone] ?

## 2021-08-15 NOTE — Telephone Encounter (Signed)
Reason for Disposition ? [1] Suture (or staple) came out early AND [2] > 48 hours since sutures placed AND [3] caller wants wound checked ? ?Answer Assessment - Initial Assessment Questions ?1. LOCATION: "Where are the sutures (or staples) located?"  ?    R knee ?2. NUMBER: "How many sutures (or staples) are there?"  ?    Unsure- 8 ?3. DATE IN: "When were the sutures (or staples) put in?"   ?    Monday- 08/06/21 ?4. DATE OUT: "When did your doctor tell you the sutures (or staples) needed to come out?" ?    Follow up-08/22/21 ?5. OTHER SYMPTOMS: "Do you have any other symptoms?" (e.g., wound pain, discharge, fever?) ?    Patient goes to therapy 2 times/week- has dressing changed at these appointments- feels dressing is wet and patient is having pain ?6. PREGNANCY: "Is there any chance you are pregnant?" "When was your last menstrual period?" ? ?Protocols used: Suture or Staple Questions-A-AH ? ?

## 2021-08-15 NOTE — Telephone Encounter (Signed)
Called patient back and he said his bandage felt wet after ascending and descending multiple flights of steps, however said there was no discoloration or anything seeping through the bandage. He sees PT tomorrow and informed him they can check the incision tomorrow. Informed pt he can change the bandage if there is any seepage or drainage with new gauze  ?

## 2021-08-16 ENCOUNTER — Encounter (HOSPITAL_BASED_OUTPATIENT_CLINIC_OR_DEPARTMENT_OTHER): Payer: Self-pay | Admitting: Physical Therapy

## 2021-08-16 ENCOUNTER — Ambulatory Visit (HOSPITAL_BASED_OUTPATIENT_CLINIC_OR_DEPARTMENT_OTHER): Payer: 59 | Admitting: Physical Therapy

## 2021-08-16 ENCOUNTER — Other Ambulatory Visit: Payer: Self-pay

## 2021-08-16 DIAGNOSIS — M25561 Pain in right knee: Secondary | ICD-10-CM | POA: Diagnosis not present

## 2021-08-16 NOTE — Therapy (Signed)
?OUTPATIENT PHYSICAL THERAPY TREATMENT ? ? ?Patient Name: Kenneth Bautista ?MRN: [id number] ?DOB:26-Nov-1985, 36 y.o., male ?Today's Date: 08/16/2021 ? ? PT End of Session - 08/16/21 1632   ? ? Visit Number 3   ? Number of Visits 18   ? Authorization Type [employer]   ? PT Start Time 1617   ? PT Stop Time 1640   ? PT Time Calculation (min) 23 min   ? Activity Tolerance Patient tolerated treatment well   ? Behavior During Therapy [facility] for tasks assessed/performed   ? ?  ?  ? ?  ? ? ? ? ?Past Medical History:  ?Diagnosis Date  ? Arthritis   ? Asthma   ? 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HCC)   ? Dyspnea   ? Epigastric hernia   ? GERD (gastroesophageal reflux disease)   ? History of 2019 novel coronavirus disease (COVID-19) 06/24/2019  ? Pneumonia   ? Polysubstance abuse (HCC)   ? ETOH, marijuana, cocaine, amphetamines  ? Tachycardia   ? ?Past Surgical History:  ?Procedure Laterality Date  ? ANTERIOR CRUCIATE LIGAMENT REPAIR Right 02/01/2021  ? Procedure: RECONSTRUCTION ANTERIOR CRUCIATE LIGAMENT (ACL);  Surgeon: Bjorn Pippin, MD;  Location: [facility] OR;  Service: Orthopedics;  Laterality: Right;  ? EPIGASTRIC HERNIA REPAIR N/A 11/15/2020  ? Procedure: HERNIA REPAIR EPIGASTRIC ADULT, open;  Surgeon: Duanne Guess, MD;  Location: [facility] ORS;  Service: General;  Laterality: N/A;  ? EXTERNAL FIXATION LEG Right 01/27/2021  ? Procedure: EXTERNAL FIXATION KNEE;  Surgeon: Roby Lofts, MD;  Location: [facility] OR;  Service: Orthopedics;  Laterality: Right;  ? HERNIA REPAIR    ? KNEE ARTHROSCOPY Right 08/06/2021  ? Procedure: RIGHT KNEE ARTHROSCOPIC LYSIS OF ADHESIONS AND MUNIPULATION UNDER ANESTHESIA / POSSIBLE LATERAL EXRA-ARTICULAR TENODESIS;  Surgeon: Huel Cote, MD;  Location: [facility] OR;  Service: Orthopedics;  Laterality: Right;  ? None    ? ORIF ELBOW FRACTURE Right 01/27/2021  ? Procedure: OPEN REDUCTION INTERNAL FIXATION (ORIF) ELBOW/OLECRANON FRACTURE;  Surgeon: Roby Lofts, MD;  Location: [facility] OR;  Service: Orthopedics;  Laterality: Right;  ?  ORIF TIBIA FRACTURE Left 01/27/2021  ? Procedure: OPEN REDUCTION INTERNAL FIXATION (ORIF) TIBIA FRACTURE;  Surgeon: Roby Lofts, MD;  Location: [facility] OR;  Service: Orthopedics;  Laterality: Left;  ? POSTERIOR CRUCIATE LIGAMENT RECONSTRUCTION Right 02/01/2021  ? Procedure: RECONSTRUCTION POSTERIOR CRUCIATE LIGAMENT (PCL);  Surgeon: Bjorn Pippin, MD;  Location: [facility] OR;  Service: Orthopedics;  Laterality: Right;  ? ?Patient Active Problem List  ? Diagnosis Date Noted  ? Arthrofibrosis of knee joint, right   ? Adjustment disorder with mixed anxiety and depressed mood 04/25/2021  ? Anxiety and depression 04/19/2021  ? Insomnia 04/05/2021  ? Postoperative pain   ? Multiple trauma   ? Hypoalbuminemia due to protein-calorie malnutrition (HCC)   ? Hyponatremia   ? Sleep disturbance   ? Acute blood loss anemia   ? TBI (traumatic brain injury) 02/12/2021  ? Epidural hematoma 01/27/2021  ? Right knee dislocation, initial encounter 01/27/2021  ? Closed fracture of left proximal tibia 01/27/2021  ? Fracture of olecranon process of ulna, right, open type I or II, initial encounter 01/27/2021  ? Closed displaced fracture of left acromial process 01/27/2021  ? Epigastric hernia   ? Cocaine abuse (HCC) 09/05/2020  ? Gastroesophageal reflux disease without esophagitis 09/05/2020  ? Alcohol abuse 09/05/2020  ? Atypical chest pain 08/17/2020  ? Tobacco abuse 02/21/2020  ? Asthma, mild intermittent 02/21/2020  ? Dental caries 02/21/2020  ?  Costochondritis 02/21/2020  ? ? ?PCP: Georganna Skeans, MD ? ?REFERRING PROVIDER: Georganna Skeans, MD ? ?REFERRING DIAG: M24.661 (ICD-10-CM) - Arthrofibrosis of knee joint, right  ? ?THERAPY DIAG:  ?Pain in joint of right knee ? ?Muscle weakness (generalized) ? ?Difficulty walking ? ?ONSET DATE: 08/06/21 ? ?SUBJECTIVE:  ? ?SUBJECTIVE STATEMENT: ?Pt is now walking with a cane. [place] He states it started feeling wet but denies changes in bandage color or  seepage under Tegaderm. ? ?PERTINENT HISTORY: ?L knee tibial fx, depression, anxiety, ETOH abuse,  ? ?PAIN:  ?PAIN:  ?Are you having pain? Yes ?VAS scale: 2/10 ?Pain location: R lateral incision site ? ? ?FALLS:  ?Has patient fallen in last 6 months? No, Number of falls: 0 ? ?OCCUPATION:  Dishwasher, previously  ? ?PLOF: Independent ? ?PATIENT GOALS : Pt states his goal is to get back to work.  ? ? ?OBJECTIVE:  ? ?PT is WBAT and ROM as tolerated.  ? ?DIAGNOSTIC FINDINGS: IMPRESSION: ?1. Prior ACL repair with the ACL graft intact. ?2. Small longitudinal linear signal abnormality in the central ?posterior horn of the medial meniscus towards the body similar in ?appearance to the prior examination which may reflect prior repair ?versus a small retear. Blunting of the body of the medial meniscus ?and anterior horn consistent with prior partial meniscectomy. ?3. Partial-thickness cartilage loss of the medial femorotibial ?compartment with subchondral reactive marrow edema. ?4. Small joint effusion. ?  ?LE AROM/PROM: ? ?A/PROM Right ?08/16/2021 Left ?08/16/2021 R   ?Knee flexion 80 95 100  ?Knee extension -10 -5 -2  ? (Blank rows = not tested) ? ? ?TODAY'S TREATMENT: ? ?Dressing/bandage change: removal of ace bandage, replacement of surgical dressing with fresh gauze and covered in Tegaderm; no signs of erythema or drainage ? ?Exercises ?Table sidestepping 4x with UE support needed ?Sitting Heel Slide with Towel - 2 x daily - 7 x weekly - 2 sets - 10 reps - 5 hold ?Standing HR 20x ?Seated knee tailgate stretch 5s 10x ?Standing gastroc stretch 30s 3x ? ?Gait: SPC cuing for sequencing and safety, able to ambulate with cane with supervision ? ? ?HOME EXERCISE PROGRAM: ?Access Code: [email or web address] ?URL: [email or web address] ?Date: 08/09/2021 ?Prepared by: Zebedee Iba ? ?PATIENT EDUCATION:  ?Education details:  anatomy, exercise progression, DOMS expectations, muscle firing,  envelope of function, HEP, POC  ?Person  educated: Patient ?Education method: Explanation, Demonstration, Tactile cues, Verbal cues, and Handouts ?Education comprehension: verbalized understanding, returned demonstration, verbal cues required, and tactile cues required ? ? ?ASSESSMENT: ? ?CLINICAL IMPRESSION: ?Pt arrives late to appt. Pt incisions were clean with stitches in places. Moisture under bandage appeared to be sweat. Fresh bandages were applied and pt able to continue with WB exercise at today's session but continues to have endurance deficits with extended standing, with continued weakness into quad, HS, triceps surae, and hips. Plan to continue with ROM and strength as tolerated. Pt would benefit from continued skilled therapy in order to reach goals and maximize functional R LE strength and ROM for full return to PLOF. ? ? ? ?OBJECTIVE IMPAIRMENTS Abnormal gait, decreased activity tolerance, decreased mobility, difficulty walking, decreased ROM, decreased strength, hypomobility, increased muscle spasms, impaired flexibility, improper body mechanics, postural dysfunction, and pain.  ? ?ACTIVITY LIMITATIONS cleaning, community activity, driving, occupation, Pharmacologist, yard work, shopping, and Interior and spatial designer.  ? ?PERSONAL FACTORS Behavior pattern, Education, Fitness, Past/current experiences, Time since onset of injury/illness/exacerbation, and 3+ comorbidities:  are also affecting patient's  functional outcome.  ? ? ?REHAB POTENTIAL: Fair ? ?CLINICAL DECISION MAKING: Stable/uncomplicated ? ?EVALUATION COMPLEXITY: Low ? ? ?GOALS: ? ? ?SHORT TERM GOALS: ? ?Pt will become independent with HEP in order to demonstrate synthesis of PT education. ? ? ?Target date: 09/20/2021 ?Goal status: INITIAL ? ?2.  Pt will be able to demonstrate ability to ambulate without AD in order to demonstrate functional improvement in LE function for self-care and house hold duties. ? ? ?Target date: 09/20/2021 ?Goal status: INITIAL ? ?3.  Pt will be able to demonstrate  ability to perform STS without UE in order to demonstrate functional improvement in LE function for self-care and house hold duties. ?  ?Target date: 09/20/2021 ?Goal status: INITIAL ? ? ?LONG TERM GOALS: ? ?Pt  wi

## 2021-08-20 ENCOUNTER — Other Ambulatory Visit: Payer: Self-pay

## 2021-08-20 ENCOUNTER — Ambulatory Visit (HOSPITAL_BASED_OUTPATIENT_CLINIC_OR_DEPARTMENT_OTHER): Payer: 59 | Admitting: Physical Therapy

## 2021-08-20 ENCOUNTER — Encounter (HOSPITAL_BASED_OUTPATIENT_CLINIC_OR_DEPARTMENT_OTHER): Payer: Self-pay | Admitting: Physical Therapy

## 2021-08-20 DIAGNOSIS — M25561 Pain in right knee: Secondary | ICD-10-CM | POA: Diagnosis not present

## 2021-08-20 DIAGNOSIS — R262 Difficulty in walking, not elsewhere classified: Secondary | ICD-10-CM

## 2021-08-20 DIAGNOSIS — M6281 Muscle weakness (generalized): Secondary | ICD-10-CM

## 2021-08-20 NOTE — Therapy (Signed)
?OUTPATIENT PHYSICAL THERAPY TREATMENT ? ? ?Patient Name: Kenneth Bautista ?MRN: [id number] ?DOB:1985/08/19, 36 y.o., male ?Today's Date: 08/20/2021 ? ? PT End of Session - 08/20/21 1510   ? ? Visit Number 4   ? Number of Visits 18   ? Authorization Type [employer]   ? PT Start Time 1515   ? PT Stop Time 1550   ? PT Time Calculation (min) 35 min   ? Activity Tolerance Patient tolerated treatment well   ? Behavior During Therapy [facility] for tasks assessed/performed   ? ?  ?  ? ?  ? ? ? ? ? ?Past Medical History:  ?Diagnosis Date  ? Arthritis   ? Asthma   ? 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[place])   ? Dyspnea   ? Epigastric hernia   ? GERD (gastroesophageal reflux disease)   ? History of 2019 novel coronavirus disease (COVID-19) 06/24/2019  ? Pneumonia   ? Polysubstance abuse ([place])   ? ETOH, marijuana, cocaine, amphetamines  ? Tachycardia   ? ?Past Surgical History:  ?Procedure Laterality Date  ? ANTERIOR CRUCIATE LIGAMENT REPAIR Right 02/01/2021  ? Procedure: RECONSTRUCTION ANTERIOR CRUCIATE LIGAMENT (ACL);  Surgeon: Hiram Gash, MD;  Location: [place];  Service: Orthopedics;  Laterality: Right;  ? EPIGASTRIC HERNIA REPAIR N/A 11/15/2020  ? Procedure: HERNIA REPAIR EPIGASTRIC ADULT, open;  Surgeon: Fredirick Maudlin, MD;  Location: [facility] ORS;  Service: General;  Laterality: N/A;  ? EXTERNAL FIXATION LEG Right 01/27/2021  ? Procedure: EXTERNAL FIXATION KNEE;  Surgeon: Shona Needles, MD;  Location: [place];  Service: Orthopedics;  Laterality: Right;  ? HERNIA REPAIR    ? KNEE ARTHROSCOPY Right 08/06/2021  ? Procedure: RIGHT KNEE ARTHROSCOPIC LYSIS OF ADHESIONS AND MUNIPULATION UNDER ANESTHESIA / POSSIBLE LATERAL EXRA-ARTICULAR TENODESIS;  Surgeon: Vanetta Mulders, MD;  Location: [place];  Service: Orthopedics;  Laterality: Right;  ? None    ? ORIF ELBOW FRACTURE Right 01/27/2021  ? Procedure: OPEN REDUCTION INTERNAL FIXATION (ORIF) ELBOW/OLECRANON FRACTURE;  Surgeon: Shona Needles, MD;  Location: [place];  Service: Orthopedics;  Laterality: Right;   ? ORIF TIBIA FRACTURE Left 01/27/2021  ? Procedure: OPEN REDUCTION INTERNAL FIXATION (ORIF) TIBIA FRACTURE;  Surgeon: Shona Needles, MD;  Location: [place];  Service: Orthopedics;  Laterality: Left;  ? POSTERIOR CRUCIATE LIGAMENT RECONSTRUCTION Right 02/01/2021  ? Procedure: RECONSTRUCTION POSTERIOR CRUCIATE LIGAMENT (PCL);  Surgeon: Hiram Gash, MD;  Location: [place];  Service: Orthopedics;  Laterality: Right;  ? ?Patient Active Problem List  ? Diagnosis Date Noted  ? Arthrofibrosis of knee joint, right   ? Adjustment disorder with mixed anxiety and depressed mood 04/25/2021  ? Anxiety and depression 04/19/2021  ? Insomnia 04/05/2021  ? Postoperative pain   ? Multiple trauma   ? Hypoalbuminemia due to protein-calorie malnutrition ([place])   ? Hyponatremia   ? Sleep disturbance   ? Acute blood loss anemia   ? TBI (traumatic brain injury) 02/12/2021  ? Epidural hematoma 01/27/2021  ? Right knee dislocation, initial encounter 01/27/2021  ? Closed fracture of left proximal tibia 01/27/2021  ? Fracture of olecranon process of ulna, right, open type I or II, initial encounter 01/27/2021  ? Closed displaced fracture of left acromial process 01/27/2021  ? Epigastric hernia   ? Cocaine abuse ([place]) 09/05/2020  ? Gastroesophageal reflux disease without esophagitis 09/05/2020  ? Alcohol abuse 09/05/2020  ? Atypical chest pain 08/17/2020  ? Tobacco abuse 02/21/2020  ? Asthma, mild intermittent 02/21/2020  ? Dental caries 02/21/2020  ?  Costochondritis 02/21/2020  ? ? ?PCP: Dorna Mai, MD ? ?REFERRING PROVIDER: Dorna Mai, MD ? ?REFERRING DIAG: M24.661 (ICD-10-CM) - Arthrofibrosis of knee joint, right  ? ?THERAPY DIAG:  ?Pain in joint of right knee ? ?Muscle weakness (generalized) ? ?Difficulty walking ? ?ONSET DATE: 08/06/21 ? ?SUBJECTIVE:  ? ?SUBJECTIVE STATEMENT: ?Pt states the knee is hurting more today. He has been up and moving more over the weekend. He has been cleaning up his house a lot. He has been using his pain  meds.  ? ?PERTINENT HISTORY: ?L knee tibial fx, depression, anxiety, ETOH abuse,  ? ?PAIN:  ?PAIN:  ?Are you having pain? Yes ?VAS scale: 7/10 ?Pain location: R lateral incision site ? ? ?FALLS:  ?Has patient fallen in last 6 months? No, Number of falls: 0 ? ?OCCUPATION:  Dishwasher, previously  ? ?PLOF: Independent ? ?PATIENT GOALS : Pt states his goal is to get back to work.  ? ? ?OBJECTIVE:  ? ?PT is WBAT and ROM as tolerated.  ? ?DIAGNOSTIC FINDINGS: IMPRESSION: ?1. Prior ACL repair with the ACL graft intact. ?2. Small longitudinal linear signal abnormality in the central ?posterior horn of the medial meniscus towards the body similar in ?appearance to the prior examination which may reflect prior repair ?versus a small retear. Blunting of the body of the medial meniscus ?and anterior horn consistent with prior partial meniscectomy. ?3. Partial-thickness cartilage loss of the medial femorotibial ?compartment with subchondral reactive marrow edema. ?4. Small joint effusion. ?  ?LE AROM/PROM: ? ?A/PROM Right ?08/20/2021 Left ?08/20/2021 R   ?Knee flexion 80 95 105  ?Knee extension -10 -5 0  ? (Blank rows = not tested) ? ? ?TODAY'S TREATMENT: ? Dressing inspected: no signs of erythema or drainage, Tegaderm still in place ? ?Patellar mobs all directions grade II-III ? ?Exercises ? ?SLR 5x (extensor lag, painful- stopped) ?Attempted SAQ (very painful)  ?Standing hip ABD 2x10 each ?LAQ 3x5 ?Standing HR 20x ?Seated knee tailgate stretch 5s 10x ?Standing gastroc stretch 30s 3x ? ? ?HOME EXERCISE PROGRAM: ?Access Code: [email or web address] ?URL: [email or web address] ?Date: 08/09/2021 ?Prepared by: Daleen Bo ? ?PATIENT EDUCATION:  ?Education details:  anatomy, exercise progression, DOMS expectations, muscle firing,  envelope of function, HEP, POC  ?Person educated: Patient ?Education method: Explanation, Demonstration, Tactile cues, Verbal cues, and Handouts ?Education comprehension: verbalized understanding, returned  demonstration, verbal cues required, and tactile cues required ? ? ?ASSESSMENT: ? ?CLINICAL IMPRESSION: ?Pt with increased pain at today's session that likely due to report of increased activity at home. Pt session limited today by increased pain with WB, manual, and stretching exercise. Pt reports not having access to ice in order to manage edema and pain. Pt was able to tolerate gentle CKC exercise as well as hip activation without significantly increasing pain. Pt to see MD prior to next therapy session. Per conversation with AT, SW will be contacted in order to get pt an ice machine. Pt would benefit from continued skilled therapy in order to reach goals and maximize functional R LE strength and ROM for full return to PLOF. ? ? ? ?OBJECTIVE IMPAIRMENTS Abnormal gait, decreased activity tolerance, decreased mobility, difficulty walking, decreased ROM, decreased strength, hypomobility, increased muscle spasms, impaired flexibility, improper body mechanics, postural dysfunction, and pain.  ? ?ACTIVITY LIMITATIONS cleaning, community activity, driving, occupation, Medical sales representative, yard work, shopping, and Naval architect.  ? ?PERSONAL FACTORS Behavior pattern, Education, Fitness, Past/current experiences, Time since onset of injury/illness/exacerbation, and 3+ comorbidities:  are also affecting patient's functional outcome.  ? ? ?  REHAB POTENTIAL: Fair ? ?CLINICAL DECISION MAKING: Stable/uncomplicated ? ?EVALUATION COMPLEXITY: Low ? ? ?GOALS: ? ? ?SHORT TERM GOALS: ? ?Pt will become independent with HEP in order to demonstrate synthesis of PT education. ? ? ?Target date: 09/20/2021 ?Goal status: INITIAL ? ?2.  Pt will be able to demonstrate ability to ambulate without AD in order to demonstrate functional improvement in LE function for self-care and house hold duties. ? ? ?Target date: 09/20/2021 ?Goal status: INITIAL ? ?3.  Pt will be able to demonstrate ability to perform STS without UE in order to demonstrate functional  improvement in LE function for self-care and house hold duties. ?  ?Target date: 09/20/2021 ?Goal status: INITIAL ? ? ?LONG TERM GOALS: ? ?Pt  will become independent with final HEP in order to demonstrate

## 2021-08-22 ENCOUNTER — Telehealth: Payer: Self-pay | Admitting: Orthopaedic Surgery

## 2021-08-22 ENCOUNTER — Other Ambulatory Visit: Payer: Self-pay

## 2021-08-22 ENCOUNTER — Ambulatory Visit (INDEPENDENT_AMBULATORY_CARE_PROVIDER_SITE_OTHER): Payer: 59 | Admitting: Orthopaedic Surgery

## 2021-08-22 DIAGNOSIS — M24661 Ankylosis, right knee: Secondary | ICD-10-CM

## 2021-08-22 MED ORDER — TRAMADOL HCL 50 MG PO TABS: 50.0000 mg | ORAL_TABLET | Freq: Four times a day (QID) | ORAL | 2 refills | Status: AC | PRN
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
## 2021-08-22 NOTE — Addendum Note (Signed)
Addended by: Benancio Deeds on: 08/22/2021 02:06 PM ? ? Modules accepted: Orders ? ?

## 2021-08-22 NOTE — Telephone Encounter (Signed)
Pt is wanting to know when the pain medication will be called in -- ? ?Please call  ?

## 2021-08-23 ENCOUNTER — Ambulatory Visit (INDEPENDENT_AMBULATORY_CARE_PROVIDER_SITE_OTHER): Payer: 59 | Admitting: Physician Assistant

## 2021-08-23 ENCOUNTER — Ambulatory Visit (HOSPITAL_BASED_OUTPATIENT_CLINIC_OR_DEPARTMENT_OTHER): Payer: 59 | Admitting: Physical Therapy

## 2021-08-23 DIAGNOSIS — T07XXXA Unspecified multiple injuries, initial encounter: Secondary | ICD-10-CM | POA: Diagnosis not present

## 2021-08-23 DIAGNOSIS — F4323 Adjustment disorder with mixed anxiety and depressed mood: Secondary | ICD-10-CM

## 2021-08-23 MED ORDER — TRAZODONE HCL 150 MG PO TABS: 150.0000 mg | ORAL_TABLET | Freq: Every day | ORAL | 2 refills | Status: DC

## 2021-08-23 MED ORDER — PAROXETINE HCL 20 MG PO TABS: 20.0000 mg | ORAL_TABLET | Freq: Every day | ORAL | 0 refills | Status: DC

## 2021-08-23 MED ORDER — PAROXETINE HCL 30 MG PO TABS
30.0000 mg | ORAL_TABLET | Freq: Every day | ORAL | 1 refills | Status: DC
Start: 2021-08-23 — End: 2021-10-04
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
## 2021-08-24 ENCOUNTER — Encounter (HOSPITAL_COMMUNITY): Payer: Self-pay | Admitting: Physician Assistant

## 2021-08-30 ENCOUNTER — Telehealth: Payer: Self-pay | Admitting: Orthopedic Surgery

## 2021-08-30 ENCOUNTER — Other Ambulatory Visit (HOSPITAL_BASED_OUTPATIENT_CLINIC_OR_DEPARTMENT_OTHER): Payer: Self-pay | Admitting: Orthopaedic Surgery

## 2021-08-30 DIAGNOSIS — G8928 Other chronic postprocedural pain: Secondary | ICD-10-CM

## 2021-08-30 NOTE — Telephone Encounter (Signed)
Mr. Giannino called in asking to be referred to pain management, specifically [facility].  He tried calling himself to make the appointment, but the clinic asked for referral to come from patient's orthopedist.  Please call him with any questions at [phone]. ?

## 2021-08-30 NOTE — Telephone Encounter (Signed)
Referral sent to [facility] ?

## 2021-08-31 ENCOUNTER — Other Ambulatory Visit: Payer: Self-pay | Admitting: Family Medicine

## 2021-08-31 DIAGNOSIS — T07XXXA Unspecified multiple injuries, initial encounter: Secondary | ICD-10-CM

## 2021-08-31 NOTE — Telephone Encounter (Signed)
Medication: gabapentin (NEURONTIN) 100 MG capsule [[id number]]  ? ?Has the patient contacted their pharmacy? NO advised to contact the pharmacy next time ?(Agent: If no, request that the patient contact the pharmacy for the refill. If patient does not wish to contact the pharmacy document the reason why and proceed with request.) ?(Agent: If yes, when and what did the pharmacy advise?) ? ?Preferred Pharmacy (with phone number or street name): [id number] - [place] - [address] ?[address] [place] [address] ?Phone: [phone] Fax: [phone] ?Hours: Not open 24 hours ? ? ?Has the patient been seen for an appointment in the last year OR does the patient have an upcoming appointment? YES 10/02/21 ? ?Agent: Please be advised that RX refills may take up to 3 business days. We ask that you follow-up with your pharmacy. ?

## 2021-09-02 ENCOUNTER — Other Ambulatory Visit: Payer: Self-pay | Admitting: Family Medicine

## 2021-09-02 DIAGNOSIS — T07XXXA Unspecified multiple injuries, initial encounter: Secondary | ICD-10-CM

## 2021-09-03 ENCOUNTER — Ambulatory Visit (HOSPITAL_BASED_OUTPATIENT_CLINIC_OR_DEPARTMENT_OTHER): Payer: 59 | Attending: Orthopaedic Surgery | Admitting: Physical Therapy

## 2021-09-03 ENCOUNTER — Encounter (HOSPITAL_BASED_OUTPATIENT_CLINIC_OR_DEPARTMENT_OTHER): Payer: Self-pay | Admitting: Physical Therapy

## 2021-09-03 DIAGNOSIS — R262 Difficulty in walking, not elsewhere classified: Secondary | ICD-10-CM | POA: Diagnosis present

## 2021-09-03 DIAGNOSIS — M6281 Muscle weakness (generalized): Secondary | ICD-10-CM | POA: Insufficient documentation

## 2021-09-03 DIAGNOSIS — M25561 Pain in right knee: Secondary | ICD-10-CM | POA: Insufficient documentation

## 2021-09-03 NOTE — Telephone Encounter (Signed)
Duplicate request. ?Requested Prescriptions  ?Pending Prescriptions Disp Refills  ?? gabapentin (NEURONTIN) 100 MG capsule 180 capsule 0  ?  Sig: Take 2 capsules (200 mg total) by mouth 3 (three) times daily.  ?  ? Neurology: Anticonvulsants - gabapentin Passed - 08/31/2021  4:50 PM  ?  ?  Passed - Cr in normal range and within 360 days  ?  Creatinine  ?Date Value Ref Range Status  ?01/14/2013 0.80 0.60 - 1.30 mg/dL Final  ? ?Creatinine, Ser  ?Date Value Ref Range Status  ?08/06/2021 0.88 0.61 - 1.24 mg/dL Final  ?   ?  ?  Passed - Completed PHQ-2 or PHQ-9 in the last 360 days  ?  ?  Passed - Valid encounter within last 12 months  ?  Recent Outpatient Visits   ?      ? 1 month ago Anxiety and depression  ? Primary Care at [facility], MD  ? 4 months ago Anxiety and depression  ? Primary Care at [facility], MD  ? 5 months ago Insomnia, unspecified type  ? Primary Care at [facility], Gildardo Pounds, NP  ? 5 months ago Anxiety and depression  ? Primary Care at [facility], MD  ? 10 months ago Tachycardia  ? Primary Care at [facility], Kandee Keen, MD  ?  ?  ?Future Appointments   ?        ? In 4 weeks Georganna Skeans, MD Primary Care at [facility]  ?  ? ?  ?  ?  ? ? ?

## 2021-09-03 NOTE — Therapy (Signed)
?OUTPATIENT PHYSICAL THERAPY TREATMENT ? ? ?Patient Name: Kenneth PorchJeremy D Bautista ?MRN: [id number] ?DOB:[birth year], 36 y.o., male ?Today's Date: 09/03/2021 ? ? PT End of Session - 09/03/21 1716   ? ? Visit Number 5   ? Number of Visits 18   ? Authorization Type [employer]   ? PT Start Time 1645   ? PT Stop Time 1715   pt requests to end early  ? PT Time Calculation (min) 30 min   ? Activity Tolerance Patient tolerated treatment well   ? Behavior During Therapy [facility] for tasks assessed/performed   ? ?  ?  ? ?  ? ? ? ? ? ? ?Past Medical History:  ?Diagnosis Date  ? Arthritis   ? Asthma   ? 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HCC)   ? Dyspnea   ? Epigastric hernia   ? GERD (gastroesophageal reflux disease)   ? History of 2019 novel coronavirus disease (COVID-19) 06/24/2019  ? Pneumonia   ? Polysubstance abuse (HCC)   ? ETOH, marijuana, cocaine, amphetamines  ? Tachycardia   ? ?Past Surgical History:  ?Procedure Laterality Date  ? ANTERIOR CRUCIATE LIGAMENT REPAIR Right 02/01/2021  ? Procedure: RECONSTRUCTION ANTERIOR CRUCIATE LIGAMENT (ACL);  Surgeon: Bjorn PippinVarkey, Dax T, MD;  [facility];  Service: Orthopedics;  Laterality: Right;  ? EPIGASTRIC HERNIA REPAIR N/A 11/15/2020  ? Procedure: HERNIA REPAIR EPIGASTRIC ADULT, open;  Surgeon: Duanne Guessannon, Jennifer, MD;  Location: [facility] ORS;  Service: General;  Laterality: N/A;  ? EXTERNAL FIXATION LEG Right 01/27/2021  ? Procedure: EXTERNAL FIXATION KNEE;  Surgeon: Roby LoftsHaddix, Kevin P, MD;  Location: [facility] OR;  Service: Orthopedics;  Laterality: Right;  ? HERNIA REPAIR    ? KNEE ARTHROSCOPY Right 08/06/2021  ? Procedure: RIGHT KNEE ARTHROSCOPIC LYSIS OF ADHESIONS AND MUNIPULATION UNDER ANESTHESIA / POSSIBLE LATERAL EXRA-ARTICULAR TENODESIS;  Surgeon: Huel CoteBokshan, Steven, MD;  Location: [facility] OR;  Service: Orthopedics;  Laterality: Right;  ? None    ? ORIF ELBOW FRACTURE Right 01/27/2021  ? Procedure: OPEN REDUCTION INTERNAL FIXATION (ORIF) ELBOW/OLECRANON FRACTURE;  Surgeon: Roby LoftsHaddix, Kevin P, MD;  Location: [facility] OR;  Service:  Orthopedics;  Laterality: Right;  ? ORIF TIBIA FRACTURE Left 01/27/2021  ? Procedure: OPEN REDUCTION INTERNAL FIXATION (ORIF) TIBIA FRACTURE;  Surgeon: Roby LoftsHaddix, Kevin P, MD;  Location: [facility] OR;  Service: Orthopedics;  Laterality: Left;  ? POSTERIOR CRUCIATE LIGAMENT RECONSTRUCTION Right 02/01/2021  ? Procedure: RECONSTRUCTION POSTERIOR CRUCIATE LIGAMENT (PCL);  Surgeon: Bjorn PippinVarkey, Dax T, MD;  [facility];  Service: Orthopedics;  Laterality: Right;  ? ?Patient Active Problem List  ? Diagnosis Date Noted  ? Arthrofibrosis of knee joint, right   ? Adjustment disorder with mixed anxiety and depressed mood 04/25/2021  ? Anxiety and depression 04/19/2021  ? Insomnia 04/05/2021  ? Postoperative pain   ? Multiple trauma   ? Hypoalbuminemia due to protein-calorie malnutrition (HCC)   ? Hyponatremia   ? Sleep disturbance   ? Acute blood loss anemia   ? TBI (traumatic brain injury) (HCC) 02/12/2021  ? Epidural hematoma (HCC) 01/27/2021  ? Right knee dislocation, initial encounter 01/27/2021  ? Closed fracture of left proximal tibia 01/27/2021  ? Fracture of olecranon process of ulna, right, open type I or II, initial encounter 01/27/2021  ? Closed displaced fracture of left acromial process 01/27/2021  ? Epigastric hernia   ? Cocaine abuse (HCC) 09/05/2020  ? Gastroesophageal reflux disease without esophagitis 09/05/2020  ? Alcohol abuse 09/05/2020  ? Atypical chest pain 08/17/2020  ? Tobacco abuse 02/21/2020  ?  Asthma, mild intermittent 02/21/2020  ? Dental caries 02/21/2020  ? Costochondritis 02/21/2020  ? ? ?PCP: Georganna Skeans, MD ? ?REFERRING PROVIDER: Georganna Skeans, MD ? ?REFERRING DIAG: M24.661 (ICD-10-CM) - Arthrofibrosis of knee joint, right  ? ?THERAPY DIAG:  ?Pain in joint of right knee ? ?Muscle weakness (generalized) ? ?Difficulty walking ? ?ONSET DATE: 08/06/21 ? ?SUBJECTIVE:  ? ?SUBJECTIVE STATEMENT: ?Pt states the knee is hurting more today. He walked from house to the store that was a little less than a mile. It  took about 45 mins. The L knee continues to bother him more than the R one. Pt states he is "somewhat" complaint with HEP. Pt states that steps still hurt both legs. Pt reports SW has not contacted him about getting him an ice machine.  ? ?PERTINENT HISTORY: ?L knee tibial fx, depression, anxiety, ETOH abuse,  ? ?PAIN:  ?PAIN:  ?Are you having pain? Yes ?VAS scale: 7/10 ?Pain location: R lateral incision site ? ? ?FALLS:  ?Has patient fallen in last 6 months? No, Number of falls: 0 ? ?OCCUPATION:  Dishwasher, previously  ? ?PLOF: Independent ? ?PATIENT GOALS : Pt states his goal is to get back to work.  ? ? ?OBJECTIVE:  ? ?PT is WBAT and ROM as tolerated.  ? ?DIAGNOSTIC FINDINGS: IMPRESSION: ?1. Prior ACL repair with the ACL graft intact. ?2. Small longitudinal linear signal abnormality in the central ?posterior horn of the medial meniscus towards the body similar in ?appearance to the prior examination which may reflect prior repair ?versus a small retear. Blunting of the body of the medial meniscus ?and anterior horn consistent with prior partial meniscectomy. ?3. Partial-thickness cartilage loss of the medial femorotibial ?compartment with subchondral reactive marrow edema. ?4. Small joint effusion. ?  ?LE AROM/PROM: ? ?A/PROM Right ?09/03/2021 Left ?09/03/2021 R  R  ?Knee flexion 80 95 105 120  ?Knee extension -10 -5 0 0  ? (Blank rows = not tested) ? ? ?TODAY'S TREATMENT: ? ? ?Exercises ? ?SLR 2x10 ?Bridging 3x10 ?Standing hip ABD 2x10 each ?LAQ 20x ?Standing HR 2x10 ?Standing march 20x ?Heel slide 10x ? ? ?HOME EXERCISE PROGRAM: ?Access Code: [email or web address] ?URL: [email or web address] ?Date: 08/09/2021 ?Prepared by: Zebedee Iba ? ?PATIENT EDUCATION:  ?Education details:  gait mechanics, DOMS expectations, muscle firing,  envelope of function, HEP, POC  ?Person educated: Patient ?Education method: Explanation, Demonstration, Tactile cues, Verbal cues, and Handouts ?Education comprehension: verbalized  understanding, returned demonstration, verbal cues required, and tactile cues required ? ? ?ASSESSMENT: ? ?CLINICAL IMPRESSION: ?Pt able to increase repetitions with exercise at today's session but has increased discomfort. Moderate edema still presents and pt unable to manage swelling due to lack of access to ice. Pt has better quad contraction as well as tolerance to standing WB exercise but is limited by anterior knee discomfort. Pt has significantly improved ROM. Plan to focus on quad strength and SL stability as able. Pt would benefit from continued skilled therapy in order to reach goals and maximize functional R LE strength and ROM for full return to PLOF. ? ? ? ?OBJECTIVE IMPAIRMENTS Abnormal gait, decreased activity tolerance, decreased mobility, difficulty walking, decreased ROM, decreased strength, hypomobility, increased muscle spasms, impaired flexibility, improper body mechanics, postural dysfunction, and pain.  ? ?ACTIVITY LIMITATIONS cleaning, community activity, driving, occupation, Pharmacologist, yard work, shopping, and Interior and spatial designer.  ? ?PERSONAL FACTORS Behavior pattern, Education, Fitness, Past/current experiences, Time since onset of injury/illness/exacerbation, and 3+ comorbidities:  are also affecting patient's functional outcome.  ? ? ?REHAB POTENTIAL:  Fair ? ?CLINICAL DECISION MAKING: Stable/uncomplicated ? ?EVALUATION COMPLEXITY: Low ? ? ?GOALS: ? ? ?SHORT TERM GOALS: ? ?Pt will become independent with HEP in order to demonstrate synthesis of PT education. ? ? ?Target date: 09/20/2021 ?Goal status: INITIAL ? ?2.  Pt will be able to demonstrate ability to ambulate without AD in order to demonstrate functional improvement in LE function for self-care and house hold duties. ? ? ?Target date: 09/20/2021 ?Goal status: INITIAL ? ?3.  Pt will be able to demonstrate ability to perform STS without UE in order to demonstrate functional improvement in LE function for self-care and house hold duties. ?   ?Target date: 09/20/2021 ?Goal status: INITIAL ? ? ?LONG TERM GOALS: ? ?Pt  will become independent with final HEP in order to demonstrate synthesis of PT education. ? ? ?Target date: 11/01/2021 ?Goal status: INITIAL ?

## 2021-09-05 ENCOUNTER — Ambulatory Visit (HOSPITAL_BASED_OUTPATIENT_CLINIC_OR_DEPARTMENT_OTHER): Payer: 59 | Admitting: Physical Therapy

## 2021-09-13 ENCOUNTER — Ambulatory Visit (HOSPITAL_BASED_OUTPATIENT_CLINIC_OR_DEPARTMENT_OTHER): Payer: 59 | Admitting: Physical Therapy

## 2021-09-13 ENCOUNTER — Encounter (HOSPITAL_BASED_OUTPATIENT_CLINIC_OR_DEPARTMENT_OTHER): Payer: Self-pay | Admitting: Physical Therapy

## 2021-09-13 DIAGNOSIS — M25561 Pain in right knee: Secondary | ICD-10-CM

## 2021-09-13 DIAGNOSIS — M6281 Muscle weakness (generalized): Secondary | ICD-10-CM

## 2021-09-13 DIAGNOSIS — R262 Difficulty in walking, not elsewhere classified: Secondary | ICD-10-CM

## 2021-09-13 NOTE — Therapy (Signed)
?OUTPATIENT PHYSICAL THERAPY TREATMENT ? ? ?Patient Name: Kenneth Bautista ?MRN: [id number] ?DOB:June 21, 1985, 36 y.o., male ?Today's Date: 09/13/2021 ? ? PT End of Session - 09/13/21 1558   ? ? Visit Number 6   ? Number of Visits 18   ? Authorization Type [employer]   ? PT Start Time 1515   ? PT Stop Time 1558   ? PT Time Calculation (min) 43 min   ? Activity Tolerance Patient tolerated treatment well   ? Behavior During Therapy [facility] for tasks assessed/performed   ? ?  ?  ? ?  ? ? ? ? ? ? ? ?Past Medical History:  ?Diagnosis Date  ? Arthritis   ? Asthma   ? 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HCC)   ? Dyspnea   ? Epigastric hernia   ? GERD (gastroesophageal reflux disease)   ? History of 2019 novel coronavirus disease (COVID-19) 06/24/2019  ? Pneumonia   ? Polysubstance abuse (HCC)   ? ETOH, marijuana, cocaine, amphetamines  ? Tachycardia   ? ?Past Surgical History:  ?Procedure Laterality Date  ? ANTERIOR CRUCIATE LIGAMENT REPAIR Right 02/01/2021  ? Procedure: RECONSTRUCTION ANTERIOR CRUCIATE LIGAMENT (ACL);  Surgeon: Bjorn Pippin, MD;  Location: [facility] OR;  Service: Orthopedics;  Laterality: Right;  ? EPIGASTRIC HERNIA REPAIR N/A 11/15/2020  ? Procedure: HERNIA REPAIR EPIGASTRIC ADULT, open;  Surgeon: Duanne Guess, MD;  Location: [facility] ORS;  Service: General;  Laterality: N/A;  ? EXTERNAL FIXATION LEG Right 01/27/2021  ? Procedure: EXTERNAL FIXATION KNEE;  Surgeon: Roby Lofts, MD;  Location: [facility] OR;  Service: Orthopedics;  Laterality: Right;  ? HERNIA REPAIR    ? KNEE ARTHROSCOPY Right 08/06/2021  ? Procedure: RIGHT KNEE ARTHROSCOPIC LYSIS OF ADHESIONS AND MUNIPULATION UNDER ANESTHESIA / POSSIBLE LATERAL EXRA-ARTICULAR TENODESIS;  Surgeon: Huel Cote, MD;  Location: [facility] OR;  Service: Orthopedics;  Laterality: Right;  ? None    ? ORIF ELBOW FRACTURE Right 01/27/2021  ? Procedure: OPEN REDUCTION INTERNAL FIXATION (ORIF) ELBOW/OLECRANON FRACTURE;  Surgeon: Roby Lofts, MD;  Location: [facility] OR;  Service: Orthopedics;  Laterality:  Right;  ? ORIF TIBIA FRACTURE Left 01/27/2021  ? Procedure: OPEN REDUCTION INTERNAL FIXATION (ORIF) TIBIA FRACTURE;  Surgeon: Roby Lofts, MD;  Location: [facility] OR;  Service: Orthopedics;  Laterality: Left;  ? POSTERIOR CRUCIATE LIGAMENT RECONSTRUCTION Right 02/01/2021  ? Procedure: RECONSTRUCTION POSTERIOR CRUCIATE LIGAMENT (PCL);  Surgeon: Bjorn Pippin, MD;  Location: [facility] OR;  Service: Orthopedics;  Laterality: Right;  ? ?Patient Active Problem List  ? Diagnosis Date Noted  ? Arthrofibrosis of knee joint, right   ? Adjustment disorder with mixed anxiety and depressed mood 04/25/2021  ? Anxiety and depression 04/19/2021  ? Insomnia 04/05/2021  ? Postoperative pain   ? Multiple trauma   ? Hypoalbuminemia due to protein-calorie malnutrition (HCC)   ? Hyponatremia   ? Sleep disturbance   ? Acute blood loss anemia   ? TBI (traumatic brain injury) (HCC) 02/12/2021  ? Epidural hematoma (HCC) 01/27/2021  ? Right knee dislocation, initial encounter 01/27/2021  ? Closed fracture of left proximal tibia 01/27/2021  ? Fracture of olecranon process of ulna, right, open type I or II, initial encounter 01/27/2021  ? Closed displaced fracture of left acromial process 01/27/2021  ? Epigastric hernia   ? Cocaine abuse (HCC) 09/05/2020  ? Gastroesophageal reflux disease without esophagitis 09/05/2020  ? Alcohol abuse 09/05/2020  ? Atypical chest pain 08/17/2020  ? Tobacco abuse 02/21/2020  ? Asthma, mild intermittent 02/21/2020  ?  Dental caries 02/21/2020  ? Costochondritis 02/21/2020  ? ? ?PCP: Georganna SkeansWilson, Amelia, MD ? ?REFERRING PROVIDER: Georganna SkeansWilson, Amelia, MD ? ?REFERRING DIAG: M24.661 (ICD-10-CM) - Arthrofibrosis of knee joint, right  ? ?THERAPY DIAG:  ?Pain in joint of right knee ? ?Muscle weakness (generalized) ? ?Difficulty walking ? ?ONSET DATE: 08/06/21 ? ?SUBJECTIVE:  ? ?SUBJECTIVE STATEMENT: ?Pt states that he fell in the bathroom earlier this morning due to getting his legs tied up and the L leg gave out on him. He held onto the sink  and landed on his L side/shoulder. He denies hitting his head or losing consciousness. Pt states the R leg does not hurt. Pt states that the L knee will give out randomly on him while he is in his house.  ? ?PERTINENT HISTORY: ?L knee tibial fx, depression, anxiety, ETOH abuse,  ? ?PAIN:  ?PAIN:  ?Are you having pain? Yes ?VAS scale: 10/10 ?Pain location: L knee ? ? ?FALLS:  ?Has patient fallen in last 6 months? No, Number of falls: 0 ? ?OCCUPATION:  Dishwasher, previously  ? ?PLOF: Independent ? ?PATIENT GOALS : Pt states his goal is to get back to work.  ? ? ?OBJECTIVE:  ? ?PT is WBAT and ROM as tolerated.  ? ?DIAGNOSTIC FINDINGS: IMPRESSION: ?1. Prior ACL repair with the ACL graft intact. ?2. Small longitudinal linear signal abnormality in the central ?posterior horn of the medial meniscus towards the body similar in ?appearance to the prior examination which may reflect prior repair ?versus a small retear. Blunting of the body of the medial meniscus ?and anterior horn consistent with prior partial meniscectomy. ?3. Partial-thickness cartilage loss of the medial femorotibial ?compartment with subchondral reactive marrow edema. ?4. Small joint effusion. ?  ?LE AROM/PROM: ? ?A/PROM Right ? Left ? R  R R ?4/13  ?Knee flexion 80 95 105 120 125  ?Knee extension -10 -5 0 0 0  ? (Blank rows = not tested) ? ? ?TODAY'S TREATMENT: ? ?Nu-step L1 5 min ? ?Exercises ? ?SLR 2x10 ?Bridging 3x10 ?Standing hip ABD 2x10 each ?Prone HS curl 2x10 ?LAQ 2x10 3lbs ?seated HR 20x ?Standing march 20x (on hold) ?Seated clamshell RTB at knee hip ABD 2x10 ?Heel slide 10x ? ? ?HOME EXERCISE PROGRAM: ?Access Code: [email or web address] ?URL: [email or web address] ?Date: 08/09/2021 ?Prepared by: Zebedee IbaAlan Geniyah Eischeid ? ?PATIENT EDUCATION:  ?Education details:  gait mechanics, DOMS expectations, muscle firing,  envelope of function, HEP, POC  ?Person educated: Patient ?Education method: Explanation, Demonstration, Tactile cues, Verbal cues, and  Handouts ?Education comprehension: verbalized understanding, returned demonstration, verbal cues required, and tactile cues required ? ? ?ASSESSMENT: ? ?CLINICAL IMPRESSION: ?Pt demonstrates continued improvement with R quad contraction as well as ROM at today's session. Pt is limited with CKC exercise due to L knee pain. Pt did not have signs of internal derangement of the R following report of fall. Pt is slowly improving the R knee functional but is largely limited by L knee ability to WB without pain. Pt to see MD on 4/19 to discuss L knee surgical intervention. Pt would benefit from continued skilled therapy in order to reach goals and maximize functional R LE strength and ROM for full return to PLOF. ? ? ? ?OBJECTIVE IMPAIRMENTS Abnormal gait, decreased activity tolerance, decreased mobility, difficulty walking, decreased ROM, decreased strength, hypomobility, increased muscle spasms, impaired flexibility, improper body mechanics, postural dysfunction, and pain.  ? ?ACTIVITY LIMITATIONS cleaning, community activity, driving, occupation, Pharmacologistlaundry, yard work, shopping, and Interior and spatial designerexercise/recreation.  ? ?PERSONAL FACTORS Behavior pattern, Education,  Fitness, Past/current experiences, Time since onset of injury/illness/exacerbation, and 3+ comorbidities:  are also affecting patient's functional outcome.  ? ? ?REHAB POTENTIAL: Fair ? ?CLINICAL DECISION MAKING: Stable/uncomplicated ? ?EVALUATION COMPLEXITY: Low ? ? ?GOALS: ? ? ?SHORT TERM GOALS: ? ?Pt will become independent with HEP in order to demonstrate synthesis of PT education. ? ? ?Target date: 09/20/2021 ?Goal status: INITIAL ? ?2.  Pt will be able to demonstrate ability to ambulate without AD in order to demonstrate functional improvement in LE function for self-care and house hold duties. ? ? ?Target date: 09/20/2021 ?Goal status: INITIAL ? ?3.  Pt will be able to demonstrate ability to perform STS without UE in order to demonstrate functional improvement in LE  function for self-care and house hold duties. ?  ?Target date: 09/20/2021 ?Goal status: INITIAL ? ? ?LONG TERM GOALS: ? ?Pt  will become independent with final HEP in order to demonstrate synthesis of PT education. ?

## 2021-09-18 ENCOUNTER — Ambulatory Visit (HOSPITAL_BASED_OUTPATIENT_CLINIC_OR_DEPARTMENT_OTHER): Payer: 59 | Admitting: Physical Therapy

## 2021-09-18 NOTE — Therapy (Incomplete)
?OUTPATIENT PHYSICAL THERAPY TREATMENT ? ? ?Patient Name: Kenneth Bautista ?MRN: [id number] ?DOB:[birth year], 36 y.o., male ?Today's Date: 09/18/2021 ? ? ? ? ? ? ? ? ? ?Past Medical History:  ?Diagnosis Date  ? Arthritis   ? Asthma   ? 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[place])   ? Dyspnea   ? Epigastric hernia   ? GERD (gastroesophageal reflux disease)   ? History of 2019 novel coronavirus disease (COVID-19) 06/24/2019  ? Pneumonia   ? Polysubstance abuse ([place])   ? ETOH, marijuana, cocaine, amphetamines  ? Tachycardia   ? ?Past Surgical History:  ?Procedure Laterality Date  ? ANTERIOR CRUCIATE LIGAMENT REPAIR Right 02/01/2021  ? Procedure: RECONSTRUCTION ANTERIOR CRUCIATE LIGAMENT (ACL);  Surgeon: Hiram Gash, MD;  Location: [place];  Service: Orthopedics;  Laterality: Right;  ? EPIGASTRIC HERNIA REPAIR N/A 11/15/2020  ? Procedure: HERNIA REPAIR EPIGASTRIC ADULT, open;  Surgeon: Fredirick Maudlin, MD;  Location: [facility];  Service: General;  Laterality: N/A;  ? EXTERNAL FIXATION LEG Right 01/27/2021  ? Procedure: EXTERNAL FIXATION KNEE;  Surgeon: Shona Needles, MD;  Location: [facility];  Service: Orthopedics;  Laterality: Right;  ? HERNIA REPAIR    ? KNEE ARTHROSCOPY Right 08/06/2021  ? Procedure: RIGHT KNEE ARTHROSCOPIC LYSIS OF ADHESIONS AND MUNIPULATION UNDER ANESTHESIA / POSSIBLE LATERAL EXRA-ARTICULAR TENODESIS;  Surgeon: Vanetta Mulders, MD;  Location: [facility];  Service: Orthopedics;  Laterality: Right;  ? None    ? ORIF ELBOW FRACTURE Right 01/27/2021  ? Procedure: OPEN REDUCTION INTERNAL FIXATION (ORIF) ELBOW/OLECRANON FRACTURE;  Surgeon: Shona Needles, MD;  Location: [place];  Service: Orthopedics;  Laterality: Right;  ? ORIF TIBIA FRACTURE Left 01/27/2021  ? Procedure: OPEN REDUCTION INTERNAL FIXATION (ORIF) TIBIA FRACTURE;  Surgeon: Shona Needles, MD;  Location: [place];  Service: Orthopedics;  Laterality: Left;  ? POSTERIOR CRUCIATE LIGAMENT RECONSTRUCTION Right 02/01/2021  ? Procedure: RECONSTRUCTION POSTERIOR CRUCIATE LIGAMENT (PCL);   Surgeon: Hiram Gash, MD;  Location: [facility];  Service: Orthopedics;  Laterality: Right;  ? ?Patient Active Problem List  ? Diagnosis Date Noted  ? Arthrofibrosis of knee joint, right   ? Adjustment disorder with mixed anxiety and depressed mood 04/25/2021  ? Anxiety and depression 04/19/2021  ? Insomnia 04/05/2021  ? Postoperative pain   ? Multiple trauma   ? Hypoalbuminemia due to protein-calorie malnutrition ([place])   ? Hyponatremia   ? Sleep disturbance   ? Acute blood loss anemia   ? TBI (traumatic brain injury) ([place]) 02/12/2021  ? Epidural hematoma ([place]) 01/27/2021  ? Right knee dislocation, initial encounter 01/27/2021  ? Closed fracture of left proximal tibia 01/27/2021  ? Fracture of olecranon process of ulna, right, open type I or II, initial encounter 01/27/2021  ? Closed displaced fracture of left acromial process 01/27/2021  ? Epigastric hernia   ? Cocaine abuse ([place]) 09/05/2020  ? Gastroesophageal reflux disease without esophagitis 09/05/2020  ? Alcohol abuse 09/05/2020  ? Atypical chest pain 08/17/2020  ? Tobacco abuse 02/21/2020  ? Asthma, mild intermittent 02/21/2020  ? Dental caries 02/21/2020  ? Costochondritis 02/21/2020  ? ? ?PCP: Dorna Mai, MD ? ?REFERRING PROVIDER: Dorna Mai, MD ? ?REFERRING DIAG: M24.661 (ICD-10-CM) - Arthrofibrosis of knee joint, right  ? ?THERAPY DIAG:  ?No diagnosis found. ? ?ONSET DATE: 08/06/21 ? ?SUBJECTIVE:  ? ?SUBJECTIVE STATEMENT: ?Pt states that he fell in the bathroom earlier this morning due to getting his legs tied up and the L leg gave out on him. He held onto the sink  and landed on his L side/shoulder. He denies hitting his head or losing consciousness. Pt states the R leg does not hurt. Pt states that the L knee will give out randomly on him while he is in his house.  ? ?PERTINENT HISTORY: ?L knee tibial fx, depression, anxiety, ETOH abuse,  ? ?PAIN:  ?PAIN:  ?Are you having pain? Yes ?VAS scale: 10/10 ?Pain location: L knee ? ? ?FALLS:  ?Has patient  fallen in last 6 months? No, Number of falls: 0 ? ?OCCUPATION:  Dishwasher, previously  ? ?PLOF: Independent ? ?PATIENT GOALS : Pt states his goal is to get back to work.  ? ? ?OBJECTIVE:  ? ?PT is WBAT and ROM as tolerated.  ? ?DIAGNOSTIC FINDINGS: IMPRESSION: ?1. Prior ACL repair with the ACL graft intact. ?2. Small longitudinal linear signal abnormality in the central ?posterior horn of the medial meniscus towards the body similar in ?appearance to the prior examination which may reflect prior repair ?versus a small retear. Blunting of the body of the medial meniscus ?and anterior horn consistent with prior partial meniscectomy. ?3. Partial-thickness cartilage loss of the medial femorotibial ?compartment with subchondral reactive marrow edema. ?4. Small joint effusion. ?  ?LE AROM/PROM: ? ?A/PROM Right ? Left ? R  R R ?4/13  ?Knee flexion 80 95 105 120 125  ?Knee extension -10 -5 0 0 0  ? (Blank rows = not tested) ? ? ?TODAY'S TREATMENT: ? ?Nu-step L1 5 min ? ?Exercises ? ?SLR 2x10 ?Bridging 3x10 ?Standing hip ABD 2x10 each ?Prone HS curl 2x10 ?LAQ 2x10 3lbs ?seated HR 20x ?Standing march 20x (on hold) ?Seated clamshell RTB at knee hip ABD 2x10 ?Heel slide 10x ? ? ?HOME EXERCISE PROGRAM: ?Access Code: [email or web address] ?URL: [email or web address] ?Date: 08/09/2021 ?Prepared by: Daleen Bo ? ?PATIENT EDUCATION:  ?Education details:  gait mechanics, DOMS expectations, muscle firing,  envelope of function, HEP, POC  ?Person educated: Patient ?Education method: Explanation, Demonstration, Tactile cues, Verbal cues, and Handouts ?Education comprehension: verbalized understanding, returned demonstration, verbal cues required, and tactile cues required ? ? ?ASSESSMENT: ? ?CLINICAL IMPRESSION: ?Pt demonstrates continued improvement with R quad contraction as well as ROM at today's session. Pt is limited with CKC exercise due to L knee pain. Pt did not have signs of internal derangement of the R following report of  fall. Pt is slowly improving the R knee functional but is largely limited by L knee ability to WB without pain. Pt to see MD on 4/19 to discuss L knee surgical intervention. Pt would benefit from continued skilled therapy in order to reach goals and maximize functional R LE strength and ROM for full return to PLOF. ? ? ? ?OBJECTIVE IMPAIRMENTS Abnormal gait, decreased activity tolerance, decreased mobility, difficulty walking, decreased ROM, decreased strength, hypomobility, increased muscle spasms, impaired flexibility, improper body mechanics, postural dysfunction, and pain.  ? ?ACTIVITY LIMITATIONS cleaning, community activity, driving, occupation, Medical sales representative, yard work, shopping, and Naval architect.  ? ?PERSONAL FACTORS Behavior pattern, Education, Fitness, Past/current experiences, Time since onset of injury/illness/exacerbation, and 3+ comorbidities:  are also affecting patient's functional outcome.  ? ? ?REHAB POTENTIAL: Fair ? ?CLINICAL DECISION MAKING: Stable/uncomplicated ? ?EVALUATION COMPLEXITY: Low ? ? ?GOALS: ? ? ?SHORT TERM GOALS: ? ?Pt will become independent with HEP in order to demonstrate synthesis of PT education. ? ? ?Target date: 09/20/2021 ?Goal status: INITIAL ? ?2.  Pt will be able to demonstrate ability to ambulate without AD in order to demonstrate functional improvement in LE function for self-care and  house hold duties. ? ? ?Target date: 09/20/2021 ?Goal status: INITIAL ? ?3.  Pt will be able to demonstrate ability to perform STS without UE in order to demonstrate functional improvement in LE function for self-care and house hold duties. ?  ?Target date: 09/20/2021 ?Goal status: INITIAL ? ? ?LONG TERM GOALS: ? ?Pt  will become independent with final HEP in order to demonstrate synthesis of PT education. ? ? ?Target date: 11/01/2021 ?Goal status: INITIAL ? ?2. Pt will be able to demonstrate kneeling to stand and stand to kneeling transfer with UE support in order to demonstrate functional  improvement in LE function for ADL/house hold duties. ? ?Target date: 11/01/2021 ?Goal status: INITIAL ? ?3.  Pt will be able to demonstrate/report ability to walk >15 mins without pain in order to demonstrate funct

## 2021-09-19 ENCOUNTER — Encounter (HOSPITAL_BASED_OUTPATIENT_CLINIC_OR_DEPARTMENT_OTHER): Payer: 59 | Admitting: Orthopaedic Surgery

## 2021-09-20 ENCOUNTER — Encounter (HOSPITAL_BASED_OUTPATIENT_CLINIC_OR_DEPARTMENT_OTHER): Payer: 59 | Admitting: Orthopaedic Surgery

## 2021-09-20 ENCOUNTER — Ambulatory Visit (HOSPITAL_BASED_OUTPATIENT_CLINIC_OR_DEPARTMENT_OTHER): Payer: 59 | Admitting: Physical Therapy

## 2021-09-24 ENCOUNTER — Encounter (HOSPITAL_BASED_OUTPATIENT_CLINIC_OR_DEPARTMENT_OTHER): Payer: 59 | Admitting: Orthopaedic Surgery

## 2021-09-25 ENCOUNTER — Ambulatory Visit (HOSPITAL_BASED_OUTPATIENT_CLINIC_OR_DEPARTMENT_OTHER): Payer: 59 | Admitting: Physical Therapy

## 2021-09-27 ENCOUNTER — Ambulatory Visit (HOSPITAL_BASED_OUTPATIENT_CLINIC_OR_DEPARTMENT_OTHER): Payer: 59 | Admitting: Physical Therapy

## 2021-09-28 ENCOUNTER — Other Ambulatory Visit: Payer: Self-pay | Admitting: Family Medicine

## 2021-09-28 DIAGNOSIS — T07XXXA Unspecified multiple injuries, initial encounter: Secondary | ICD-10-CM

## 2021-10-02 ENCOUNTER — Ambulatory Visit: Payer: 59 | Admitting: Family Medicine

## 2021-10-02 ENCOUNTER — Ambulatory Visit (HOSPITAL_COMMUNITY): Payer: 59 | Admitting: Licensed Clinical Social Worker

## 2021-10-04 ENCOUNTER — Telehealth (INDEPENDENT_AMBULATORY_CARE_PROVIDER_SITE_OTHER): Payer: 59 | Admitting: Physician Assistant

## 2021-10-04 DIAGNOSIS — F4323 Adjustment disorder with mixed anxiety and depressed mood: Secondary | ICD-10-CM | POA: Diagnosis not present

## 2021-10-04 DIAGNOSIS — T07XXXA Unspecified multiple injuries, initial encounter: Secondary | ICD-10-CM | POA: Diagnosis not present

## 2021-10-04 MED ORDER — PAROXETINE HCL 40 MG PO TABS: 40.0000 mg | ORAL_TABLET | Freq: Every day | ORAL | 1 refills | Status: DC

## 2021-10-04 MED ORDER — TRAZODONE HCL 100 MG PO TABS: 200.0000 mg | ORAL_TABLET | Freq: Every day | ORAL | 1 refills | Status: DC

## 2021-10-04 NOTE — Progress Notes (Addendum)
BH MD/PA/NP OP Progress Note ? ?Virtual Visit via Telephone Note ? ?I connected with Kenneth Bautista on 10/04/21 at 11:30 AM EDT by telephone and verified that I am speaking with the correct person using two identifiers. ? ?Location: ?Patient: Home ?Provider: Clinic ?  ?I discussed the limitations, risks, security and privacy concerns of performing an evaluation and management service by telephone and the availability of in person appointments. I also discussed with the patient that there may be a patient responsible charge related to this service. The patient expressed understanding and agreed to proceed. ? ?Follow Up Instructions: ? ?I discussed the assessment and treatment plan with the patient. The patient was provided an opportunity to ask questions and all were answered. The patient agreed with the plan and demonstrated an understanding of the instructions. ?  ?The patient was advised to call back or seek an in-person evaluation if the symptoms worsen or if the condition fails to improve as anticipated. ? ?I provided 22 minutes of non-face-to-face time during this encounter. ? ?Kenneth Hatchet, PA ? ? ?10/04/2021 11:51 AM ?Kenneth Bautista  ?MRN:  [id number] ? ?Chief Complaint:  ?Chief Complaint  ?Patient presents with  ? Follow-up  ? Medication Management  ? ?HPI:  ? ?Kenneth Bautista is a 36 year old male with a past psychiatric history significant for past multiple traumas, issues with sleep, and adjustment disorder who presents to [facility] behavioral health outpatient clinic via virtual telephone visit for follow-up and medication management.  Patient is currently being managed on the following medications: ? ?Paroxetine 30 mg daily ?Trazodone 150 mg at bedtime ? ?Patient reports that his use of his medications have been going well but he has still been experiencing difficulty sleeping and depressive symptoms.  Patient states that he has a number of stressors attributed to the amount of pain that he is in.   Due to being under duress, patient states that he tends to snap at people more often.  Patient endorses depressive symptoms that have been bothering him for the past 2 weeks.  Patient endorses mood swings, irritability, low mood, and lying in bed.  Patient also endorses anxiety and rates his anxiety an 8 out of 10.  Patient states that he is stressed out about bills and is currently looking for another caretaker.  He reports that his friend has been helping him cook and clean due to having no income and lack of mobility.  A PHQ-9 screen was performed with the patient scoring a 21.  A GAD-7 screen was also performed with the patient scoring a 16. ? ?Patient is alert and oriented x4, calm, cooperative, and fully engaged in conversation during the encounter.  Patient endorses good mood.  Patient denies suicidal or homicidal ideations.  He further denies auditory or visual hallucinations and does not appear to be responding to internal 5 external stimuli.  Patient endorses fair sleep and receives on average 5 hours of sleep each night.  Patient states that he is often unable to fall asleep and constantly experiences tossing and turning.  Patient endorses decreased appetite and eats on average 1 meal a day.  Patient states that he feels sick when eating food.  Patient denies alcohol consumption.  Patient endorses tobacco use and smokes on average a pack a day.  Patient endorses illicit drug use in the form of marijuana and states that marijuana helps to increase his appetite. ? ?Visit Diagnosis:  ?  ICD-10-CM   ?1. Multiple trauma  T07.Lorne Skeens  traZODone (DESYREL) 100 MG tablet  ?  ?2. Adjustment disorder with mixed anxiety and depressed mood  F43.23 PARoxetine (PAXIL) 40 MG tablet  ?  ? ? ?Past Psychiatric History:  ?Multiple trauma ?Adjustment disorder with mixed anxiety and depressed mood ? ?Past Medical History:  ?Past Medical History:  ?Diagnosis Date  ? Arthritis   ? Asthma   ? 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[facility])   ? Dyspnea   ?  Epigastric hernia   ? GERD (gastroesophageal reflux disease)   ? History of 2019 novel coronavirus disease (COVID-19) 06/24/2019  ? Pneumonia   ? Polysubstance abuse ([facility])   ? ETOH, marijuana, cocaine, amphetamines  ? Tachycardia   ?  ?Past Surgical History:  ?Procedure Laterality Date  ? ANTERIOR CRUCIATE LIGAMENT REPAIR Right 02/01/2021  ? Procedure: RECONSTRUCTION ANTERIOR CRUCIATE LIGAMENT (ACL);  Surgeon: Bjorn PippinVarkey, Dax T, MD;  Location: [facility];  Service: Orthopedics;  Laterality: Right;  ? EPIGASTRIC HERNIA REPAIR N/A 11/15/2020  ? Procedure: HERNIA REPAIR EPIGASTRIC ADULT, open;  Surgeon: Duanne Guessannon, Jennifer, MD;  Location: [facility];  Service: General;  Laterality: N/A;  ? EXTERNAL FIXATION LEG Right 01/27/2021  ? Procedure: EXTERNAL FIXATION KNEE;  Surgeon: Roby LoftsHaddix, Kevin P, MD;  Location: [facility];  Service: Orthopedics;  Laterality: Right;  ? HERNIA REPAIR    ? KNEE ARTHROSCOPY Right 08/06/2021  ? Procedure: RIGHT KNEE ARTHROSCOPIC LYSIS OF ADHESIONS AND MUNIPULATION UNDER ANESTHESIA / POSSIBLE LATERAL EXRA-ARTICULAR TENODESIS;  Surgeon: Huel CoteBokshan, Steven, MD;  Location: [facility];  Service: Orthopedics;  Laterality: Right;  ? None    ? ORIF ELBOW FRACTURE Right 01/27/2021  ? Procedure: OPEN REDUCTION INTERNAL FIXATION (ORIF) ELBOW/OLECRANON FRACTURE;  Surgeon: Roby LoftsHaddix, Kevin P, MD;  Location: [facility];  Service: Orthopedics;  Laterality: Right;  ? ORIF TIBIA FRACTURE Left 01/27/2021  ? Procedure: OPEN REDUCTION INTERNAL FIXATION (ORIF) TIBIA FRACTURE;  Surgeon: Roby LoftsHaddix, Kevin P, MD;  Location: [facility];  Service: Orthopedics;  Laterality: Left;  ? POSTERIOR CRUCIATE LIGAMENT RECONSTRUCTION Right 02/01/2021  ? Procedure: RECONSTRUCTION POSTERIOR CRUCIATE LIGAMENT (PCL);  Surgeon: Bjorn PippinVarkey, Dax T, MD;  [facility];  Service: Orthopedics;  Laterality: Right;  ? ? ?Family Psychiatric History:  ?Patient denies a family history of psychiatric illness ? ?Family History:  ?Family History  ?Problem Relation Age of Onset  ? Heart disease Mother    ?     details unknown  ? Heart disease Father   ?     details unknown  ? ? ?Social History:  ?Social History  ? ?Socioeconomic History  ? Marital status: Single  ?  Spouse name: Not on file  ? Number of children: Not on file  ? Years of education: Not on file  ? Highest education level: Not on file  ?Occupational History  ? Not on file  ?Tobacco Use  ? Smoking status: Every Day  ?  Packs/day: 0.50  ?  Years: 20.00  ?  Pack years: 10.00  ?  Types: Cigarettes  ? Smokeless tobacco: Never  ?Vaping Use  ? Vaping Use: Never used  ?Substance and Sexual Activity  ? Alcohol use: Yes  ?  Comment: none right now sober since accident.  ? Drug use: Not Currently  ?  Types: Cocaine, Amphetamines, Marijuana  ?  Comment: has not used drugs since Augest 1st  ? Sexual activity: Yes  ?  Partners: Female  ?Other Topics Concern  ? Not on file  ?Social History Narrative  ? ** Merged History Encounter **  ?    ? ?  Social Determinants of Health  ? ?Financial Resource Strain: High Risk  ? Difficulty of Paying Living Expenses: Very hard  ?Food Insecurity: No Food Insecurity  ? Worried About Programme researcher, broadcasting/film/video in the Last Year: Never true  ? Ran Out of Food in the Last Year: Never true  ?Transportation Needs: No Transportation Needs  ? Lack of Transportation (Medical): No  ? Lack of Transportation (Non-Medical): No  ?Physical Activity: Inactive  ? Days of Exercise per Week: 0 days  ? Minutes of Exercise per Session: 0 min  ?Stress: Stress Concern Present  ? Feeling of Stress : Very much  ?Social Connections: Moderately Isolated  ? Frequency of Communication with Friends and Family: More than three times a week  ? Frequency of Social Gatherings with Friends and Family: More than three times a week  ? Attends Religious Services: More than 4 times per year  ? Active Member of Clubs or Organizations: No  ? Attends Banker Meetings: Never  ? Marital Status: Divorced  ? ? ?Allergies: No Known Allergies ? ?Metabolic Disorder Labs: ?No  results found for: HGBA1C, MPG ?No results found for: PROLACTIN ?Lab Results  ?Component Value Date  ? CHOL 132 08/18/2020  ? TRIG 77 08/18/2020  ? HDL 64 08/18/2020  ? CHOLHDL 2.1 08/18/2020  ? VLDL 15

## 2021-10-05 ENCOUNTER — Telehealth (HOSPITAL_COMMUNITY): Payer: Self-pay | Admitting: Licensed Clinical Social Worker

## 2021-10-05 ENCOUNTER — Encounter (HOSPITAL_COMMUNITY): Payer: Self-pay

## 2021-10-05 ENCOUNTER — Ambulatory Visit (HOSPITAL_COMMUNITY): Payer: 59 | Admitting: Licensed Clinical Social Worker

## 2021-10-05 NOTE — Telephone Encounter (Signed)
LCSW sent three links to patient phone at 10, 1005, and 1010 with out response. Pt will be marked as a no show. This is patients 2nd no show in 5 months and pt has also canceled 1 appointment in the last 90 day. LCSW will advise that he get walk in appointments moving forward. LCSW disconnected at 1015.  ?

## 2021-10-07 ENCOUNTER — Encounter (HOSPITAL_COMMUNITY): Payer: Self-pay | Admitting: Physician Assistant

## 2021-10-22 ENCOUNTER — Other Ambulatory Visit: Payer: Self-pay | Admitting: Family Medicine

## 2021-10-22 DIAGNOSIS — T07XXXA Unspecified multiple injuries, initial encounter: Secondary | ICD-10-CM

## 2021-11-05 ENCOUNTER — Ambulatory Visit: Payer: Self-pay | Admitting: *Deleted

## 2021-11-05 NOTE — Telephone Encounter (Signed)
noted 

## 2021-11-05 NOTE — Telephone Encounter (Signed)
  Chief Complaint: unable to eat Symptoms: can not tolerate food, weakness, dizziness Frequency: 4 days Pertinent Negatives: Patient denies vomiting Disposition: [x] ED /[] Urgent Care (no appt availability in office) / [] Appointment(In office/virtual)/ []  [facility]/ [] Home Care/ [] Refused Recommended Disposition /[] [facility]/ []  Follow-up with PCP Additional Notes: Advised ED - unable to eat

## 2021-11-05 NOTE — Telephone Encounter (Signed)
Reason for Disposition  [1] Losing weight AND [2] diabetes suspected by triager (e.g., excessive drinking, frequent urination, rapid breathing, etc.)  Answer Assessment - Initial Assessment Questions 1. CONCERN: "What happened that made you call today?"     No appetite- unable to get food down 2. ONSET: "When did the  appetite begin?" (e.g., minutes, hours or days ago)     4 days 3. UNDERWEIGHT: "Are you losing weight?" "Is the weight loss intentional?"     unsure 4. OVERWEIGHT: "Do you feel you are gaining too much weight?"     na 5. FOOD INTAKE: "Do you worry about what you eat? How much you eat?"     Food makes patient feel sick- lightheaded 6. EXERCISE:  "Do you exercise?" If Yes, ask: "What type of exercise do you do?"     na 7. CURRENT WEIGHT: "How much do you weigh?"       unsure 8. CURRENT HEIGHT: "How tall are you?"     unsure 9. BMI: "What is your BMI (body mass index)?" Note: The triager can use the BMI calculator on the CDC website at [employer].    - Underweight: Below 18.5   - Normal, healthy weight: 18.5 - 24.9   - Overweight: 25.0 - 29.9   - Obese: 30 or above     Normal weight 10. MEDICINES: "Do you routinely use medicines (laxatives, diuretics, enemas) to help control your weight?"       No 11. OTHER SYMPTOMS: "What symptoms are you currently experiencing?" (e.g., none; abnormal heart rate, blackout spells, shakiness, suicidal thoughts, vomiting)       Weakness, lightheaded 12. PREGNANCY: "Is there any chance you are pregnant?" "When was your last menstrual period?"  Protocols used: Eating Disorders Symptoms and Questions-A-AH

## 2021-11-06 ENCOUNTER — Ambulatory Visit: Payer: 59 | Admitting: Family Medicine

## 2021-11-09 ENCOUNTER — Ambulatory Visit (INDEPENDENT_AMBULATORY_CARE_PROVIDER_SITE_OTHER): Payer: 59 | Admitting: Orthopaedic Surgery

## 2021-11-09 ENCOUNTER — Ambulatory Visit (INDEPENDENT_AMBULATORY_CARE_PROVIDER_SITE_OTHER): Payer: 59

## 2021-11-09 DIAGNOSIS — M5116 Intervertebral disc disorders with radiculopathy, lumbar region: Secondary | ICD-10-CM

## 2021-11-09 DIAGNOSIS — S82402K Unspecified fracture of shaft of left fibula, subsequent encounter for closed fracture with nonunion: Secondary | ICD-10-CM | POA: Diagnosis not present

## 2021-11-09 DIAGNOSIS — M25562 Pain in left knee: Secondary | ICD-10-CM

## 2021-11-09 DIAGNOSIS — S82202K Unspecified fracture of shaft of left tibia, subsequent encounter for closed fracture with nonunion: Secondary | ICD-10-CM | POA: Diagnosis not present

## 2021-11-09 MED ORDER — NICOTINE 21 MG/24HR TD PT24: 21.0000 mg | MEDICATED_PATCH | Freq: Every day | TRANSDERMAL | 2 refills | Status: AC
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
## 2021-11-09 NOTE — Addendum Note (Signed)
Addended by: Yevonne Pax on: 11/09/2021 03:20 PM   Modules accepted: Orders

## 2021-11-13 ENCOUNTER — Telehealth: Payer: Self-pay | Admitting: Orthopaedic Surgery

## 2021-11-13 NOTE — Telephone Encounter (Signed)
Pt is correct, [facility] is not in network with [facility], only [place] facilities can do the scan.

## 2021-11-13 NOTE — Telephone Encounter (Signed)
Patient called in stating Dr. Lenna Gilford an MRI for him but the his [facility] does not cover the MRI there and he would need to find another place that accepts his insurance, please advise

## 2021-11-22 ENCOUNTER — Ambulatory Visit (HOSPITAL_COMMUNITY)
Admission: RE | Admit: 2021-11-22 | Discharge: 2021-11-22 | Disposition: A | Discharge status: Home / Self Care | Payer: 59 | Source: Ambulatory Visit | Attending: Orthopaedic Surgery | Admitting: Orthopaedic Surgery

## 2021-11-22 ENCOUNTER — Encounter (HOSPITAL_COMMUNITY): Payer: Self-pay

## 2021-11-22 DIAGNOSIS — S82202K Unspecified fracture of shaft of left tibia, subsequent encounter for closed fracture with nonunion: Secondary | ICD-10-CM | POA: Diagnosis present

## 2021-11-22 DIAGNOSIS — S82402K Unspecified fracture of shaft of left fibula, subsequent encounter for closed fracture with nonunion: Secondary | ICD-10-CM | POA: Insufficient documentation

## 2021-11-22 DIAGNOSIS — M5116 Intervertebral disc disorders with radiculopathy, lumbar region: Secondary | ICD-10-CM | POA: Insufficient documentation

## 2021-11-26 ENCOUNTER — Ambulatory Visit (INDEPENDENT_AMBULATORY_CARE_PROVIDER_SITE_OTHER): Payer: 59 | Admitting: Orthopaedic Surgery

## 2021-11-26 DIAGNOSIS — M5116 Intervertebral disc disorders with radiculopathy, lumbar region: Secondary | ICD-10-CM | POA: Diagnosis not present
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
## 2021-11-28 ENCOUNTER — Ambulatory Visit (INDEPENDENT_AMBULATORY_CARE_PROVIDER_SITE_OTHER): Payer: 59 | Admitting: Family Medicine

## 2021-11-28 ENCOUNTER — Encounter: Payer: Self-pay | Admitting: Family Medicine

## 2021-11-28 VITALS — Temp 98.1°F

## 2021-11-28 DIAGNOSIS — M545 Low back pain, unspecified: Secondary | ICD-10-CM | POA: Insufficient documentation

## 2021-11-28 DIAGNOSIS — Z79891 Long term (current) use of opiate analgesic: Secondary | ICD-10-CM | POA: Insufficient documentation

## 2021-11-28 DIAGNOSIS — Z0000 Encounter for general adult medical examination without abnormal findings: Secondary | ICD-10-CM | POA: Diagnosis not present

## 2021-11-28 DIAGNOSIS — G894 Chronic pain syndrome: Secondary | ICD-10-CM | POA: Insufficient documentation

## 2021-11-28 MED ORDER — TRAZODONE HCL 100 MG PO TABS: 200.0000 mg | ORAL_TABLET | Freq: Every day | ORAL | 1 refills | Status: DC

## 2021-11-28 MED ORDER — PAROXETINE HCL 40 MG PO TABS: 40.0000 mg | ORAL_TABLET | Freq: Every day | ORAL | 1 refills | Status: DC

## 2021-11-28 MED ORDER — METHOCARBAMOL 500 MG PO TABS: 500.0000 mg | ORAL_TABLET | Freq: Three times a day (TID) | ORAL | 0 refills | Status: DC
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
## 2021-11-29 ENCOUNTER — Other Ambulatory Visit: Payer: Self-pay | Admitting: Family Medicine

## 2021-11-29 DIAGNOSIS — T07XXXA Unspecified multiple injuries, initial encounter: Secondary | ICD-10-CM

## 2021-11-29 LAB — CMP14+EGFR
ALT: 17 IU/L (ref 0–44)
AST: 12 IU/L (ref 0–40)
Albumin/Globulin Ratio: 2 (ref 1.2–2.2)
Albumin: 5 g/dL (ref 4.0–5.0)
Alkaline Phosphatase: 246 IU/L — ABNORMAL HIGH (ref 44–121)
BUN/Creatinine Ratio: 12 (ref 9–20)
BUN: 10 mg/dL (ref 6–20)
Bilirubin Total: <0.2 mg/dL (ref 0.0–1.2)
CO2: 26 mmol/L (ref 20–29)
Calcium: 9.8 mg/dL (ref 8.7–10.2)
Chloride: 96 mmol/L (ref 96–106)
Creatinine, Ser: 0.85 mg/dL (ref 0.76–1.27)
Globulin, Total: 2.5 g/dL (ref 1.5–4.5)
Glucose: 93 mg/dL (ref 70–99)
Potassium: 4.3 mmol/L (ref 3.5–5.2)
Sodium: 136 mmol/L (ref 134–144)
Total Protein: 7.5 g/dL (ref 6.0–8.5)
eGFR: 115 mL/min/{1.73_m2} (ref >59)

## 2021-11-29 LAB — LIPID PANEL
Chol/HDL Ratio: 4.5 ratio (ref 0.0–5.0)
Cholesterol, Total: 213 mg/dL — ABNORMAL HIGH (ref 100–199)
HDL: 47 mg/dL (ref >39)
LDL Chol Calc (NIH): 143 mg/dL — ABNORMAL HIGH (ref 0–99)
Triglycerides: 127 mg/dL (ref 0–149)
VLDL Cholesterol Cal: 23 mg/dL (ref 5–40)

## 2021-11-29 LAB — CBC WITH DIFFERENTIAL/PLATELET
Basophils Absolute: 0.1 10*3/uL (ref 0.0–0.2)
Basos: 1 %
EOS (ABSOLUTE): 0.2 10*3/uL (ref 0.0–0.4)
Eos: 2 %
Hematocrit: 44.1 % (ref 37.5–51.0)
Hemoglobin: 14.8 g/dL (ref 13.0–17.7)
Immature Grans (Abs): 0 10*3/uL (ref 0.0–0.1)
Immature Granulocytes: 0 %
Lymphocytes Absolute: 1.6 10*3/uL (ref 0.7–3.1)
Lymphs: 21 %
MCH: 29.5 pg (ref 26.6–33.0)
MCHC: 33.6 g/dL (ref 31.5–35.7)
MCV: 88 fL (ref 79–97)
Monocytes Absolute: 0.5 10*3/uL (ref 0.1–0.9)
Monocytes: 6 %
Neutrophils Absolute: 5.1 10*3/uL (ref 1.4–7.0)
Neutrophils: 70 %
Platelets: 388 10*3/uL (ref 150–450)
RBC: 5.01 x10E6/uL (ref 4.14–5.80)
RDW: 14.5 % (ref 11.6–15.4)
WBC: 7.4 10*3/uL (ref 3.4–10.8)

## 2021-11-29 LAB — HEMOGLOBIN A1C
Est. average glucose Bld gHb Est-mCnc: 114 mg/dL
Hgb A1c MFr Bld: 5.6 % (ref 4.8–5.6)

## 2021-11-29 NOTE — Telephone Encounter (Signed)
Requested Prescriptions  Pending Prescriptions Disp Refills  . gabapentin (NEURONTIN) 100 MG capsule [Pharmacy Med Name: Gabapentin 100 MG Oral Capsule] 180 capsule 1    Sig: TAKE 2 CAPSULES BY MOUTH THREE TIMES DAILY     Neurology: Anticonvulsants - gabapentin Passed - 11/29/2021  9:20 AM      Passed - Cr in normal range and within 360 days    Creatinine  Date Value Ref Range Status  01/14/2013 0.80 0.60 - 1.30 mg/dL Final   Creatinine, Ser  Date Value Ref Range Status  11/28/2021 0.85 0.76 - 1.27 mg/dL Final         Passed - Completed PHQ-2 or PHQ-9 in the last 360 days      Passed - Valid encounter within last 12 months    Recent Outpatient Visits          Yesterday Annual physical exam   Primary Care at [facility], MD   4 months ago Anxiety and depression   Primary Care at [facility], MD   7 months ago Anxiety and depression   Primary Care at [facility], MD   7 months ago Insomnia, unspecified type   Primary Care at [facility], Gildardo Pounds, NP   8 months ago Anxiety and depression   Primary Care at [facility], MD      Future Appointments            In 3 weeks Huel Cote, MD [facility]

## 2021-12-06 ENCOUNTER — Encounter (HOSPITAL_COMMUNITY): Payer: 59 | Admitting: Student in an Organized Health Care Education/Training Program

## 2021-12-06 NOTE — Progress Notes (Signed)
Attempted to call patient's mobile device 2 times and entered the tele video visit for 10 min. Patient did not respond to either phone call and did not appear for video visit.

## 2021-12-11 ENCOUNTER — Telehealth (HOSPITAL_COMMUNITY): Payer: 59 | Admitting: Student in an Organized Health Care Education/Training Program

## 2021-12-17 ENCOUNTER — Emergency Department (HOSPITAL_COMMUNITY)
Admission: EM | Admit: 2021-12-17 | Discharge: 2021-12-18 | Disposition: A | Discharge status: Home / Self Care | Payer: 59 | Attending: Emergency Medicine | Admitting: Emergency Medicine

## 2021-12-17 ENCOUNTER — Other Ambulatory Visit: Payer: Self-pay

## 2021-12-17 ENCOUNTER — Telehealth (HOSPITAL_BASED_OUTPATIENT_CLINIC_OR_DEPARTMENT_OTHER): Payer: Self-pay | Admitting: Orthopaedic Surgery

## 2021-12-17 ENCOUNTER — Encounter (HOSPITAL_COMMUNITY): Payer: Self-pay | Admitting: *Deleted

## 2021-12-17 ENCOUNTER — Emergency Department (HOSPITAL_COMMUNITY): Payer: 59

## 2021-12-17 ENCOUNTER — Encounter (HOSPITAL_COMMUNITY): Payer: Self-pay | Admitting: Student in an Organized Health Care Education/Training Program

## 2021-12-17 DIAGNOSIS — M7989 Other specified soft tissue disorders: Secondary | ICD-10-CM | POA: Diagnosis present

## 2021-12-17 DIAGNOSIS — Z5321 Procedure and treatment not carried out due to patient leaving prior to being seen by health care provider: Secondary | ICD-10-CM | POA: Diagnosis not present

## 2021-12-17 LAB — COMPREHENSIVE METABOLIC PANEL
ALT: 94 U/L — ABNORMAL HIGH (ref 0–44)
AST: 25 U/L (ref 15–41)
Albumin: 4.1 g/dL (ref 3.5–5.0)
Alkaline Phosphatase: 469 U/L — ABNORMAL HIGH (ref 38–126)
Anion gap: 17 — ABNORMAL HIGH (ref 5–15)
BUN: 8 mg/dL (ref 6–20)
CO2: 21 mmol/L — ABNORMAL LOW (ref 22–32)
Calcium: 9.5 mg/dL (ref 8.9–10.3)
Chloride: 92 mmol/L — ABNORMAL LOW (ref 98–111)
Creatinine, Ser: 1.13 mg/dL (ref 0.61–1.24)
GFR, Estimated: >60 mL/min (ref >60)
Glucose, Bld: 131 mg/dL — ABNORMAL HIGH (ref 70–99)
Potassium: 3.4 mmol/L — ABNORMAL LOW (ref 3.5–5.1)
Sodium: 130 mmol/L — ABNORMAL LOW (ref 135–145)
Total Bilirubin: 1.1 mg/dL (ref 0.3–1.2)
Total Protein: 6.9 g/dL (ref 6.5–8.1)

## 2021-12-17 LAB — CBC WITH DIFFERENTIAL/PLATELET
Abs Immature Granulocytes: 0.04 10*3/uL (ref 0.00–0.07)
Basophils Absolute: 0.1 10*3/uL (ref 0.0–0.1)
Basophils Relative: 1 %
Eosinophils Absolute: 0 10*3/uL (ref 0.0–0.5)
Eosinophils Relative: 0 %
HCT: 44.1 % (ref 39.0–52.0)
Hemoglobin: 14.7 g/dL (ref 13.0–17.0)
Immature Granulocytes: 0 %
Lymphocytes Relative: 14 %
Lymphs Abs: 1.5 10*3/uL (ref 0.7–4.0)
MCH: 30.1 pg (ref 26.0–34.0)
MCHC: 33.3 g/dL (ref 30.0–36.0)
MCV: 90.2 fL (ref 80.0–100.0)
Monocytes Absolute: 0.7 10*3/uL (ref 0.1–1.0)
Monocytes Relative: 7 %
Neutro Abs: 7.9 10*3/uL — ABNORMAL HIGH (ref 1.7–7.7)
Neutrophils Relative %: 78 %
Platelets: 422 10*3/uL — ABNORMAL HIGH (ref 150–400)
RBC: 4.89 MIL/uL (ref 4.22–5.81)
RDW: 14.6 % (ref 11.5–15.5)
WBC: 10.2 10*3/uL (ref 4.0–10.5)
nRBC: 0 % (ref 0.0–0.2)

## 2021-12-17 LAB — LACTIC ACID, PLASMA: Lactic Acid, Venous: 2.2 mmol/L (ref 0.5–1.9)

## 2021-12-17 NOTE — Progress Notes (Signed)
Attempted to call patient 2 times, neither was successful. Also re invited patient to video visit and patient did not enter. Of note it was seen in chart that patient may be going to ED today after endorsing concern for breaking his leg.  PGY-3 Eliseo Gum, MD

## 2021-12-17 NOTE — Telephone Encounter (Signed)
Request for appt

## 2021-12-17 NOTE — ED Notes (Signed)
Patient told registration and this Clinical research associate that he is leaving and going to [facility].

## 2021-12-17 NOTE — ED Triage Notes (Signed)
Pt said he took two sleeping pills (fell asleep) and was not aware he was walking and fell in front of the fridge. C/o pain and swelling in the right knee area. Hx of surgery of both legs.

## 2021-12-17 NOTE — ED Provider Triage Note (Signed)
Emergency Medicine Provider Triage Evaluation Note  Kenneth Bautista , a 36 y.o. male  was evaluated in triage.  Pt complains of significant swelling noted to the right knee and pain.  Patient reports that yesterday/last night, he had taken his sleeping pills and was sleepwalking in the kitchen when he fell to the ground hitting his knees.  Patient reports he has previous hardware in the knee.  He has significant swelling noted.  Denies any numbness or tingling.  Has not taken anything for pain..  Review of Systems  Positive:  Negative:   Physical Exam  BP 102/75   Pulse (!) 124   Temp 98.5 F (36.9 C) (Oral)   Resp (!) 22   SpO2 98%  Gen:   Awake, tearful Resp:  Normal effort  MSK:   Moves extremities without difficulty  Other:  Significant swelling overlying the right knee with some warmth.  Bruising noted to the more inferior aspect.  Compartments are soft.  He does have pain along his tibia fibula as well.  We will order x-rays of the right knee and tib-fib.  Palpable pulses.  Compartments are soft.  Medical Decision Making  Medically screening exam initiated at 9:11 PM.  Appropriate orders placed.  Kenneth Bautista was informed that the remainder of the evaluation will be completed by another provider, this initial triage assessment does not replace that evaluation, and the importance of remaining in the ED until their evaluation is complete.   He does have pain along his tibia fibula as well.  We will order x-rays of the right knee and tib-fib.  Palpable pulses.  Compartments are soft.   Kenneth Rich, PA-C 12/17/21 2113

## 2021-12-19 ENCOUNTER — Ambulatory Visit (INDEPENDENT_AMBULATORY_CARE_PROVIDER_SITE_OTHER): Payer: 59

## 2021-12-19 ENCOUNTER — Encounter: Payer: 59 | Attending: Physical Medicine & Rehabilitation | Admitting: Physical Medicine & Rehabilitation

## 2021-12-19 ENCOUNTER — Ambulatory Visit (INDEPENDENT_AMBULATORY_CARE_PROVIDER_SITE_OTHER): Payer: 59 | Admitting: Orthopaedic Surgery

## 2021-12-19 DIAGNOSIS — M25561 Pain in right knee: Secondary | ICD-10-CM | POA: Diagnosis not present

## 2021-12-19 DIAGNOSIS — M24661 Ankylosis, right knee: Secondary | ICD-10-CM

## 2021-12-19 MED ORDER — LIDOCAINE HCL 1 % IJ SOLN
4.0000 mL | INTRAMUSCULAR | Status: AC | PRN
  Administered 2021-12-19: 4 mL

## 2021-12-19 MED ORDER — TRIAMCINOLONE ACETONIDE 40 MG/ML IJ SUSP
80.0000 mg | INTRAMUSCULAR | Status: AC | PRN
  Administered 2021-12-19: 80 mg via INTRA_ARTICULAR
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
## 2021-12-21 ENCOUNTER — Ambulatory Visit (HOSPITAL_BASED_OUTPATIENT_CLINIC_OR_DEPARTMENT_OTHER): Payer: 59 | Admitting: Orthopaedic Surgery

## 2021-12-24 ENCOUNTER — Ambulatory Visit (HOSPITAL_COMMUNITY)
Admission: RE | Admit: 2021-12-24 | Discharge: 2021-12-24 | Disposition: A | Discharge status: Home / Self Care | Payer: 59 | Source: Ambulatory Visit | Attending: Orthopaedic Surgery | Admitting: Orthopaedic Surgery

## 2021-12-24 DIAGNOSIS — M25561 Pain in right knee: Secondary | ICD-10-CM | POA: Diagnosis present

## 2021-12-25 ENCOUNTER — Telehealth: Payer: Self-pay

## 2021-12-25 NOTE — Telephone Encounter (Signed)
[place] imaging call report for MRI right knee of this pt.   1. Acute comminuted fracture of the fibular head with severe bone marrow edema. 2. Acute comminuted fracture of the proximal tibial metaphysis with a fracture cleft extending to the lateral tibial eminence and medial tibial articular surface. 3. Prior ACL repair with the ACL graft intact. Prior PCL repair with the PCL graft intact. 4. Small longitudinal tear of the posterior horn of the medial meniscus towards the free edge.

## 2021-12-26 ENCOUNTER — Ambulatory Visit: Payer: Self-pay

## 2021-12-26 ENCOUNTER — Encounter: Payer: Self-pay | Admitting: Physical Medicine and Rehabilitation

## 2021-12-26 ENCOUNTER — Ambulatory Visit (INDEPENDENT_AMBULATORY_CARE_PROVIDER_SITE_OTHER): Payer: 59 | Admitting: Physical Medicine and Rehabilitation

## 2021-12-26 VITALS — BP 114/81 | HR 106

## 2021-12-26 DIAGNOSIS — M5416 Radiculopathy, lumbar region: Secondary | ICD-10-CM | POA: Diagnosis not present

## 2021-12-26 MED ORDER — METHYLPREDNISOLONE ACETATE 80 MG/ML IJ SUSP
80.0000 mg | Freq: Once | INTRAMUSCULAR | Status: AC
  Administered 2021-12-26: 80 mg

## 2021-12-26 NOTE — Progress Notes (Signed)
Pt state lower back pain. Pt state walking, standing, bending and laying down makes the pain worse. Pt state he takes pain meds to help ease his pain.  Numeric Pain Rating Scale and Functional Assessment Average Pain 2   In the last MONTH (on 0-10 scale) has pain interfered with the following?  1. General activity like being  able to carry out your everyday physical activities such as walking, climbing stairs, carrying groceries, or moving a chair?  Rating(10)   +Driver, -BT, -Dye Allergies.

## 2021-12-26 NOTE — Patient Instructions (Signed)

## 2021-12-31 NOTE — Procedures (Signed)
Lumbosacral Transforaminal Epidural Steroid Injection - Sub-Pedicular Approach with Fluoroscopic Guidance  Patient: Kenneth Bautista      Date of Birth: 02/07/1986 MRN: [id number] PCP: Georganna Skeans, MD      Visit Date: 12/26/2021   Universal Protocol:    Date/Time: 12/26/2021  Consent Given By: the patient  Position: PRONE  Additional Comments: Vital signs were monitored before and after the procedure. Patient was prepped and draped in the usual sterile fashion. The correct patient, procedure, and site was verified.   Injection Procedure Details:   Procedure diagnoses: Lumbar radiculopathy [M54.16]    Meds Administered:  Meds ordered this encounter  Medications   methylPREDNISolone acetate (DEPO-MEDROL) injection 80 mg    Laterality: Right  Location/Site: L4  Needle:5.0 in., 22 ga.  Short bevel or Quincke spinal needle  Needle Placement: Transforaminal  Findings:    -Comments: Excellent flow of contrast along the nerve, nerve root and into the epidural space.  Procedure Details: After squaring off the end-plates to get a true AP view, the C-arm was positioned so that an oblique view of the foramen as noted above was visualized. The target area is just inferior to the "nose of the scotty dog" or sub pedicular. The soft tissues overlying this structure were infiltrated with 2-3 ml. of 1% Lidocaine without Epinephrine.  The spinal needle was inserted toward the target using a "trajectory" view along the fluoroscope beam.  Under AP and lateral visualization, the needle was advanced so it did not puncture dura and was located close the 6 O'Clock position of the pedical in AP tracterory. Biplanar projections were used to confirm position. Aspiration was confirmed to be negative for CSF and/or blood. A 1-2 ml. volume of Isovue-250 was injected and flow of contrast was noted at each level. Radiographs were obtained for documentation purposes.   After attaining the desired flow  of contrast documented above, a 0.5 to 1.0 ml test dose of 0.25% Marcaine was injected into each respective transforaminal space.  The patient was observed for 90 seconds post injection.  After no sensory deficits were reported, and normal lower extremity motor function was noted,   the above injectate was administered so that equal amounts of the injectate were placed at each foramen (level) into the transforaminal epidural space.   Additional Comments:  The patient tolerated the procedure well Dressing: 2 x 2 sterile gauze and Band-Aid    Post-procedure details: Patient was observed during the procedure. Post-procedure instructions were reviewed.  Patient left the clinic in stable condition.

## 2021-12-31 NOTE — Progress Notes (Signed)
NOCHOLAS DAMASO - 36 y.o. male MRN [id number]  Date of birth: 1985/07/26  Office Visit Note: Visit Date: 12/26/2021 PCP: Georganna Skeans, MD Referred by: Georganna Skeans, MD  Subjective: No chief complaint on file.  HPI:  AMAD MAU is a 36 y.o. male who comes in today at the request of Dr. Huel Cote for planned Right L4-5 Lumbar Transforaminal epidural steroid injection with fluoroscopic guidance.  The patient has failed conservative care including home exercise, medications, time and activity modification.  This injection will be diagnostic and hopefully therapeutic.  Please see requesting physician notes for further details and justification.   ROS Otherwise per HPI.  Assessment & Plan: Visit Diagnoses:    ICD-10-CM   1. Lumbar radiculopathy  M54.16 XR C-ARM NO REPORT    Epidural Steroid injection    methylPREDNISolone acetate (DEPO-MEDROL) injection 80 mg      Plan: No additional findings.   Meds & Orders:  Meds ordered this encounter  Medications   methylPREDNISolone acetate (DEPO-MEDROL) injection 80 mg    Orders Placed This Encounter  Procedures   XR C-ARM NO REPORT   Epidural Steroid injection    Follow-up: Return for visit to requesting provider as needed.   Procedures: No procedures performed  Lumbosacral Transforaminal Epidural Steroid Injection - Sub-Pedicular Approach with Fluoroscopic Guidance  Patient: AIZEN DUVAL      Date of Birth: [birth year] MRN: [id number] PCP: Georganna Skeans, MD      Visit Date: 12/26/2021   Universal Protocol:    Date/Time: 12/26/2021  Consent Given By: the patient  Position: PRONE  Additional Comments: Vital signs were monitored before and after the procedure. Patient was prepped and draped in the usual sterile fashion. The correct patient, procedure, and site was verified.   Injection Procedure Details:   Procedure diagnoses: Lumbar radiculopathy [M54.16]    Meds Administered:  Meds ordered this  encounter  Medications   methylPREDNISolone acetate (DEPO-MEDROL) injection 80 mg    Laterality: Right  Location/Site: L4  Needle:5.0 in., 22 ga.  Short bevel or Quincke spinal needle  Needle Placement: Transforaminal  Findings:    -Comments: Excellent flow of contrast along the nerve, nerve root and into the epidural space.  Procedure Details: After squaring off the end-plates to get a true AP view, the C-arm was positioned so that an oblique view of the foramen as noted above was visualized. The target area is just inferior to the "nose of the scotty dog" or sub pedicular. The soft tissues overlying this structure were infiltrated with 2-3 ml. of 1% Lidocaine without Epinephrine.  The spinal needle was inserted toward the target using a "trajectory" view along the fluoroscope beam.  Under AP and lateral visualization, the needle was advanced so it did not puncture dura and was located close the 6 O'Clock position of the pedical in AP tracterory. Biplanar projections were used to confirm position. Aspiration was confirmed to be negative for CSF and/or blood. A 1-2 ml. volume of Isovue-250 was injected and flow of contrast was noted at each level. Radiographs were obtained for documentation purposes.   After attaining the desired flow of contrast documented above, a 0.5 to 1.0 ml test dose of 0.25% Marcaine was injected into each respective transforaminal space.  The patient was observed for 90 seconds post injection.  After no sensory deficits were reported, and normal lower extremity motor function was noted,   the above injectate was administered so that equal amounts of the injectate were placed  at each foramen (level) into the transforaminal epidural space.   Additional Comments:  The patient tolerated the procedure well Dressing: 2 x 2 sterile gauze and Band-Aid    Post-procedure details: Patient was observed during the procedure. Post-procedure instructions were  reviewed.  Patient left the clinic in stable condition.    Clinical History: MRI LUMBAR SPINE WITHOUT CONTRAST   TECHNIQUE: Multiplanar, multisequence MR imaging of the lumbar spine was performed. No intravenous contrast was administered.   COMPARISON:  Lumbar spine CT 01/27/2021   FINDINGS: Segmentation:  Standard.   Alignment:  Normal.   Vertebrae: No fracture or significant marrow edema. 9 mm STIR hyperintense focus in the L3 vertebral body, benign in appearance and possibly reflecting an atypical hemangioma. No suspicious marrow lesion.   Conus medullaris and cauda equina: Conus extends to the L1 level. Conus and cauda equina appear normal.   Paraspinal and other soft tissues: Unremarkable.   Disc levels:   T12-L1 through L3-4: Negative.   L4-5: Minimal disc desiccation and minimal disc space narrowing. Mild disc bulging, a right foraminal to extraforaminal disc protrusion/disc osteophyte complex, and mild facet and ligamentum flavum hypertrophy result in moderate right neural foraminal stenosis with potential right L4 nerve root impingement. No spinal stenosis.   L5-S1: Minimal disc desiccation. Minimal disc bulging without stenosis.   IMPRESSION: 1. Moderate right neural foraminal stenosis at L4-5 due to a disc protrusion. 2. No spinal stenosis.     Electronically Signed   By: Sebastian Ache M.D.   On: 11/22/2021 16:05     Objective:  VS:  HT:    WT:   BMI:     BP:114/81  HR:(!) 106bpm  TEMP: ( )  RESP:  Physical Exam Vitals and nursing note reviewed.  Constitutional:      General: He is not in acute distress.    Appearance: Normal appearance. He is not ill-appearing.  HENT:     Head: Normocephalic and atraumatic.     Right Ear: External ear normal.     Left Ear: External ear normal.     Nose: No congestion.  Eyes:     Extraocular Movements: Extraocular movements intact.  Cardiovascular:     Rate and Rhythm: Normal rate.     Pulses:  Normal pulses.  Pulmonary:     Effort: Pulmonary effort is normal. No respiratory distress.  Abdominal:     General: There is no distension.     Palpations: Abdomen is soft.  Musculoskeletal:        General: No tenderness or signs of injury.     Cervical back: Neck supple.     Right lower leg: No edema.     Left lower leg: No edema.     Comments: Patient has good distal strength without clonus.  Skin:    Findings: No erythema or rash.  Neurological:     General: No focal deficit present.     Mental Status: He is alert and oriented to person, place, and time.     Sensory: No sensory deficit.     Motor: No weakness or abnormal muscle tone.     Coordination: Coordination normal.  Psychiatric:        Mood and Affect: Mood normal.        Behavior: Behavior normal.      Imaging: No results found.

## 2022-01-04 ENCOUNTER — Ambulatory Visit (INDEPENDENT_AMBULATORY_CARE_PROVIDER_SITE_OTHER): Payer: 59 | Admitting: Orthopaedic Surgery

## 2022-01-04 DIAGNOSIS — S82402K Unspecified fracture of shaft of left fibula, subsequent encounter for closed fracture with nonunion: Secondary | ICD-10-CM | POA: Diagnosis not present

## 2022-01-04 DIAGNOSIS — S82202K Unspecified fracture of shaft of left tibia, subsequent encounter for closed fracture with nonunion: Secondary | ICD-10-CM | POA: Diagnosis not present

## 2022-01-04 NOTE — Progress Notes (Signed)
Post Operative Evaluation    Procedure/Date of Surgery: Right knee arthroscopic lysis of adhesions with manipulation and lateral extra-articular tenodesis 08/06/21  Interval History:   01/04/2022: Presents today for follow-up of his right knee.  Overall he continues to feel much better.  He has been increasing his weightbearing with a walker.  Overall he does feel significantly better.  He is today for MRI assessment  Presents today for follow-up of the right knee.  He has been working physical therapy.  Overall he does feel it is improved that his motion is much better.  He is working with physical therapy for motion and strengthening.  He is taking and compliant with aspirin.  He is able to walk on the right leg with the assistance of a cane.  Left knee continues to bother him.   PMH/PSH/Family History/Social History/Meds/Allergies:    Past Medical History:  Diagnosis Date   Arthritis    Asthma    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HCC)    Dyspnea    Epigastric hernia    GERD (gastroesophageal reflux disease)    History of 2019 novel coronavirus disease (COVID-19) 06/24/2019   Pneumonia    Polysubstance abuse (HCC)    ETOH, marijuana, cocaine, amphetamines   Tachycardia    Past Surgical History:  Procedure Laterality Date   ANTERIOR CRUCIATE LIGAMENT REPAIR Right 02/01/2021   Procedure: RECONSTRUCTION ANTERIOR CRUCIATE LIGAMENT (ACL);  Surgeon: Bjorn Pippin, MD;  Location: [facility] OR;  Service: Orthopedics;  Laterality: Right;   EPIGASTRIC HERNIA REPAIR N/A 11/15/2020   Procedure: HERNIA REPAIR EPIGASTRIC ADULT, open;  Surgeon: Duanne Guess, MD;  Location: [facility] ORS;  Service: General;  Laterality: N/A;   EXTERNAL FIXATION LEG Right 01/27/2021   Procedure: EXTERNAL FIXATION KNEE;  Surgeon: Roby Lofts, MD;  Location: [facility] OR;  Service: Orthopedics;  Laterality: Right;   HERNIA REPAIR     KNEE ARTHROSCOPY Right 08/06/2021   Procedure: RIGHT KNEE ARTHROSCOPIC LYSIS OF  ADHESIONS AND MUNIPULATION UNDER ANESTHESIA / POSSIBLE LATERAL EXRA-ARTICULAR TENODESIS;  Surgeon: Huel Cote, MD;  Location: [facility] OR;  Service: Orthopedics;  Laterality: Right;   None     ORIF ELBOW FRACTURE Right 01/27/2021   Procedure: OPEN REDUCTION INTERNAL FIXATION (ORIF) ELBOW/OLECRANON FRACTURE;  Surgeon: Roby Lofts, MD;  Location: [facility] OR;  Service: Orthopedics;  Laterality: Right;   ORIF TIBIA FRACTURE Left 01/27/2021   Procedure: OPEN REDUCTION INTERNAL FIXATION (ORIF) TIBIA FRACTURE;  Surgeon: Roby Lofts, MD;  Location: [facility] OR;  Service: Orthopedics;  Laterality: Left;   POSTERIOR CRUCIATE LIGAMENT RECONSTRUCTION Right 02/01/2021   Procedure: RECONSTRUCTION POSTERIOR CRUCIATE LIGAMENT (PCL);  Surgeon: Bjorn Pippin, MD;  Location: [facility] OR;  Service: Orthopedics;  Laterality: Right;   Social History   Socioeconomic History   Marital status: Single    Spouse name: Not on file   Number of children: Not on file   Years of education: Not on file   Highest education level: Not on file  Occupational History   Not on file  Tobacco Use   Smoking status: Every Day    Packs/day: 0.50    Years: 20.00    Total pack years: 10.00    Types: Cigarettes   Smokeless tobacco: Never  Vaping Use   Vaping Use: Never used  Substance and Sexual Activity   Alcohol  use: Yes    Comment: none right now sober since accident.   Drug use: Not Currently    Types: Cocaine, Amphetamines, Marijuana    Comment: has not used drugs since Augest 1st   Sexual activity: Yes    Partners: Female  Other Topics Concern   Not on file  Social History Narrative   ** Merged History Encounter **       Social Determinants of Health   Financial Resource Strain: High Risk (04/25/2021)   Overall Financial Resource Strain (CARDIA)    Difficulty of Paying Living Expenses: Very hard  Food Insecurity: No Food Insecurity (04/25/2021)   Hunger Vital Sign    Worried About Running Out of Food in the Last Year:  Never true    Ran Out of Food in the Last Year: Never true  Transportation Needs: No Transportation Needs (04/25/2021)   PRAPARE - Administrator, Civil Service (Medical): No    Lack of Transportation (Non-Medical): No  Physical Activity: Inactive (04/25/2021)   Exercise Vital Sign    Days of Exercise per Week: 0 days    Minutes of Exercise per Session: 0 min  Stress: Stress Concern Present (04/25/2021)   [employer] of Occupational Health - Occupational Stress Questionnaire    Feeling of Stress : Very much  Social Connections: Moderately Isolated (04/25/2021)   Social Connection and Isolation Panel [NHANES]    Frequency of Communication with Friends and Family: More than three times a week    Frequency of Social Gatherings with Friends and Family: More than three times a week    Attends Religious Services: More than 4 times per year    Active Member of [employer] or Organizations: No    Attends Engineer, structural: Never    Marital Status: Divorced   Family History  Problem Relation Age of Onset   Heart disease Mother        details unknown   Heart disease Father        details unknown   No Known Allergies Current Outpatient Medications  Medication Sig Dispense Refill   albuterol (VENTOLIN HFA) 108 (90 Base) MCG/ACT inhaler Inhale 2 puffs into the lungs every 6 (six) hours as needed for wheezing or shortness of breath. 8.5 g 0   aspirin EC 325 MG tablet Take 1 tablet (325 mg total) by mouth daily. 30 tablet 0   Cholecalciferol (DIALYVITE VITAMIN D 5000) 125 MCG (5000 UT) capsule Take 5,000 Units by mouth daily.     gabapentin (NEURONTIN) 100 MG capsule TAKE 2 CAPSULES BY MOUTH THREE TIMES DAILY 180 capsule 1   hydrOXYzine (ATARAX) 25 MG tablet Take 1 tablet (25 mg total) by mouth 3 (three) times daily as needed. for anxiety (Patient taking differently: Take 25 mg by mouth 2 (two) times daily.) 20 tablet 2   methocarbamol (ROBAXIN) 500 MG tablet Take 1  tablet (500 mg total) by mouth 3 (three) times daily. 90 tablet 0   nicotine (NICODERM CQ - DOSED IN MG/24 HOURS) 14 mg/24hr patch Apply one 14 mg patch daily x2 weeks then 7 mg patch daily x3 weeks and stop 28 patch 0   nicotine (NICODERM CQ) 21 mg/24hr patch Place 1 patch (21 mg total) onto the skin daily. 28 patch 2   oxyCODONE (OXY IR/ROXICODONE) 5 MG immediate release tablet Take 1 tablet (5 mg total) by mouth every 4 (four) hours as needed (severe pain). 20 tablet 0   PARoxetine (PAXIL) 40 MG tablet  Take 1 tablet (40 mg total) by mouth daily. 90 tablet 1   traMADol (ULTRAM) 50 MG tablet Take 50 mg by mouth every 6 (six) hours as needed.     traZODone (DESYREL) 100 MG tablet Take 2 tablets (200 mg total) by mouth at bedtime. 60 tablet 1   No current facility-administered medications for this visit.   No results found.  Review of Systems:   A ROS was performed including pertinent positives and negatives as documented in the HPI.   Musculoskeletal Exam:    There were no vitals taken for this visit.  There is pain of the midshaft tibia on the left.  There is pain radiates down the right lateral aspect of the leg and a positive straight leg raise test.  Right knee lateral incision is well-healed.  Right knee is diffusely swollen and tender to any palpation tricompartmentally.  Not able to move or range his knee passively as this causes significant pain.  He sits in a wheelchair.  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   Assessment:   36 year old male with right L4-L5  lumbar radiculopathy consistent with herniated disc on MRI.  He underwent an injection with Dr. Alvester Morin and is overall feeling better from this perspective.  He is here today for MRI review.  This reveals a nondisplaced proximal fibular shaft fracture.  Overall I do believe this would do very well with closed conservative management.  We will plan to be activity as tolerated and progress his weightbearing as tolerated I will plan to see him back in 6 weeks for reassessment   Plan :    -Return to clinic 6 weeks for reassessment  I personally saw and evaluated the patient, and participated in the management and treatment plan.  Huel Cote, MD Attending Physician, Orthopedic Surgery  This document was dictated using Dragon voice recognition software. A reasonable attempt at proof reading has been made to minimize errors.

## 2022-01-14 ENCOUNTER — Other Ambulatory Visit: Payer: Self-pay

## 2022-01-14 ENCOUNTER — Emergency Department (HOSPITAL_COMMUNITY): Payer: 59

## 2022-01-14 ENCOUNTER — Observation Stay (HOSPITAL_COMMUNITY)
Admission: EM | Admit: 2022-01-14 | Discharge: 2022-01-15 | Disposition: A | Discharge status: Home / Self Care | Payer: 59 | Attending: Internal Medicine | Admitting: Internal Medicine

## 2022-01-14 ENCOUNTER — Encounter (HOSPITAL_COMMUNITY): Payer: Self-pay

## 2022-01-14 DIAGNOSIS — Z8616 Personal history of COVID-19: Secondary | ICD-10-CM | POA: Insufficient documentation

## 2022-01-14 DIAGNOSIS — Z87820 Personal history of traumatic brain injury: Secondary | ICD-10-CM | POA: Diagnosis not present

## 2022-01-14 DIAGNOSIS — E871 Hypo-osmolality and hyponatremia: Secondary | ICD-10-CM | POA: Diagnosis present

## 2022-01-14 DIAGNOSIS — Z79899 Other long term (current) drug therapy: Secondary | ICD-10-CM | POA: Insufficient documentation

## 2022-01-14 DIAGNOSIS — R112 Nausea with vomiting, unspecified: Secondary | ICD-10-CM | POA: Diagnosis not present

## 2022-01-14 DIAGNOSIS — Z7982 Long term (current) use of aspirin: Secondary | ICD-10-CM | POA: Diagnosis not present

## 2022-01-14 DIAGNOSIS — R748 Abnormal levels of other serum enzymes: Secondary | ICD-10-CM | POA: Diagnosis present

## 2022-01-14 DIAGNOSIS — R109 Unspecified abdominal pain: Secondary | ICD-10-CM

## 2022-01-14 DIAGNOSIS — F17210 Nicotine dependence, cigarettes, uncomplicated: Secondary | ICD-10-CM | POA: Insufficient documentation

## 2022-01-14 DIAGNOSIS — R079 Chest pain, unspecified: Secondary | ICD-10-CM | POA: Diagnosis present

## 2022-01-14 DIAGNOSIS — F1910 Other psychoactive substance abuse, uncomplicated: Secondary | ICD-10-CM | POA: Diagnosis present

## 2022-01-14 DIAGNOSIS — G4700 Insomnia, unspecified: Secondary | ICD-10-CM | POA: Insufficient documentation

## 2022-01-14 DIAGNOSIS — J45909 Unspecified asthma, uncomplicated: Secondary | ICD-10-CM | POA: Diagnosis not present

## 2022-01-14 DIAGNOSIS — E876 Hypokalemia: Secondary | ICD-10-CM | POA: Diagnosis not present

## 2022-01-14 DIAGNOSIS — K8020 Calculus of gallbladder without cholecystitis without obstruction: Secondary | ICD-10-CM | POA: Diagnosis not present

## 2022-01-14 DIAGNOSIS — Z720 Tobacco use: Secondary | ICD-10-CM | POA: Diagnosis present

## 2022-01-14 DIAGNOSIS — M5126 Other intervertebral disc displacement, lumbar region: Secondary | ICD-10-CM | POA: Diagnosis not present

## 2022-01-14 DIAGNOSIS — R1013 Epigastric pain: Secondary | ICD-10-CM | POA: Diagnosis present

## 2022-01-14 LAB — CBC WITH DIFFERENTIAL/PLATELET
Abs Immature Granulocytes: 0.04 10*3/uL (ref 0.00–0.07)
Basophils Absolute: 0 10*3/uL (ref 0.0–0.1)
Basophils Relative: 0 %
Eosinophils Absolute: 0 10*3/uL (ref 0.0–0.5)
Eosinophils Relative: 0 %
HCT: 40.8 % (ref 39.0–52.0)
Hemoglobin: 14.7 g/dL (ref 13.0–17.0)
Immature Granulocytes: 0 %
Lymphocytes Relative: 10 %
Lymphs Abs: 1 10*3/uL (ref 0.7–4.0)
MCH: 30.8 pg (ref 26.0–34.0)
MCHC: 36 g/dL (ref 30.0–36.0)
MCV: 85.4 fL (ref 80.0–100.0)
Monocytes Absolute: 0.6 10*3/uL (ref 0.1–1.0)
Monocytes Relative: 7 %
Neutro Abs: 7.8 10*3/uL — ABNORMAL HIGH (ref 1.7–7.7)
Neutrophils Relative %: 83 %
Platelets: 373 10*3/uL (ref 150–400)
RBC: 4.78 MIL/uL (ref 4.22–5.81)
RDW: 13.4 % (ref 11.5–15.5)
WBC: 9.5 10*3/uL (ref 4.0–10.5)
nRBC: 0 % (ref 0.0–0.2)

## 2022-01-14 LAB — COMPREHENSIVE METABOLIC PANEL
ALT: 16 U/L (ref 0–44)
AST: 18 U/L (ref 15–41)
Albumin: 4.3 g/dL (ref 3.5–5.0)
Alkaline Phosphatase: 218 U/L — ABNORMAL HIGH (ref 38–126)
Anion gap: 13 (ref 5–15)
BUN: 6 mg/dL (ref 6–20)
CO2: 20 mmol/L — ABNORMAL LOW (ref 22–32)
Calcium: 9.3 mg/dL (ref 8.9–10.3)
Chloride: 93 mmol/L — ABNORMAL LOW (ref 98–111)
Creatinine, Ser: 0.87 mg/dL (ref 0.61–1.24)
GFR, Estimated: >60 mL/min (ref >60)
Glucose, Bld: 117 mg/dL — ABNORMAL HIGH (ref 70–99)
Potassium: 2.9 mmol/L — ABNORMAL LOW (ref 3.5–5.1)
Sodium: 126 mmol/L — ABNORMAL LOW (ref 135–145)
Total Bilirubin: 0.9 mg/dL (ref 0.3–1.2)
Total Protein: 6.8 g/dL (ref 6.5–8.1)

## 2022-01-14 LAB — TROPONIN I (HIGH SENSITIVITY)
Troponin I (High Sensitivity): 7 ng/L (ref <18)
Troponin I (High Sensitivity): 6 ng/L (ref <18)

## 2022-01-14 LAB — MAGNESIUM: Magnesium: 1.7 mg/dL (ref 1.7–2.4)

## 2022-01-14 LAB — LIPASE, BLOOD: Lipase: 21 U/L (ref 11–51)

## 2022-01-14 MED ORDER — POTASSIUM CHLORIDE CRYS ER 20 MEQ PO TBCR
40.0000 meq | EXTENDED_RELEASE_TABLET | Freq: Once | ORAL | Status: AC
  Administered 2022-01-14: 40 meq via ORAL
  Filled 2022-01-14: qty 2

## 2022-01-14 MED ORDER — ONDANSETRON HCL 4 MG PO TABS: 4.0000 mg | ORAL_TABLET | Freq: Four times a day (QID) | ORAL | Status: DC | PRN

## 2022-01-14 MED ORDER — DIAZEPAM 5 MG/ML IJ SOLN
5.0000 mg | Freq: Once | INTRAMUSCULAR | Status: AC
  Administered 2022-01-14: 5 mg via INTRAVENOUS
  Filled 2022-01-14: qty 2

## 2022-01-14 MED ORDER — ALBUTEROL SULFATE (2.5 MG/3ML) 0.083% IN NEBU: 2.5000 mg | INHALATION_SOLUTION | Freq: Four times a day (QID) | RESPIRATORY_TRACT | Status: DC | PRN

## 2022-01-14 MED ORDER — GABAPENTIN 100 MG PO CAPS
200.0000 mg | ORAL_CAPSULE | Freq: Three times a day (TID) | ORAL | Status: DC
  Administered 2022-01-14 – 2022-01-15 (×2): 200 mg via ORAL
  Filled 2022-01-14 (×2): qty 2

## 2022-01-14 MED ORDER — PANTOPRAZOLE SODIUM 40 MG IV SOLR
40.0000 mg | INTRAVENOUS | Status: DC
  Administered 2022-01-15: 40 mg via INTRAVENOUS
  Filled 2022-01-14: qty 10

## 2022-01-14 MED ORDER — TRAZODONE HCL 50 MG PO TABS
200.0000 mg | ORAL_TABLET | Freq: Every day | ORAL | Status: DC
  Administered 2022-01-14: 200 mg via ORAL
  Filled 2022-01-14: qty 4

## 2022-01-14 MED ORDER — IOHEXOL 350 MG/ML SOLN
100.0000 mL | Freq: Once | INTRAVENOUS | Status: AC | PRN
  Administered 2022-01-14: 100 mL via INTRAVENOUS

## 2022-01-14 MED ORDER — ACETAMINOPHEN 650 MG RE SUPP: 650.0000 mg | Freq: Four times a day (QID) | RECTAL | Status: DC | PRN

## 2022-01-14 MED ORDER — FAMOTIDINE IN NACL 20-0.9 MG/50ML-% IV SOLN
20.0000 mg | Freq: Once | INTRAVENOUS | Status: AC
  Administered 2022-01-14: 20 mg via INTRAVENOUS
  Filled 2022-01-14: qty 50

## 2022-01-14 MED ORDER — POTASSIUM CHLORIDE CRYS ER 20 MEQ PO TBCR
40.0000 meq | EXTENDED_RELEASE_TABLET | ORAL | Status: AC
  Administered 2022-01-14: 40 meq via ORAL

## 2022-01-14 MED ORDER — ENOXAPARIN SODIUM 40 MG/0.4ML IJ SOSY
40.0000 mg | PREFILLED_SYRINGE | INTRAMUSCULAR | Status: DC
  Administered 2022-01-14: 40 mg via SUBCUTANEOUS
  Filled 2022-01-14: qty 0.4

## 2022-01-14 MED ORDER — ONDANSETRON HCL 4 MG/2ML IJ SOLN
4.0000 mg | Freq: Once | INTRAMUSCULAR | Status: AC
  Administered 2022-01-14: 4 mg via INTRAVENOUS
  Filled 2022-01-14: qty 2

## 2022-01-14 MED ORDER — SODIUM CHLORIDE 0.9 % IV SOLN: INTRAVENOUS | Status: DC

## 2022-01-14 MED ORDER — SODIUM CHLORIDE 0.9 % BOLUS PEDS
1000.0000 mL | Freq: Once | INTRAVENOUS | Status: AC
  Administered 2022-01-14: 1000 mL via INTRAVENOUS

## 2022-01-14 MED ORDER — SODIUM CHLORIDE 0.9% FLUSH
3.0000 mL | Freq: Two times a day (BID) | INTRAVENOUS | Status: DC
  Administered 2022-01-14 – 2022-01-15 (×3): 3 mL via INTRAVENOUS

## 2022-01-14 MED ORDER — ACETAMINOPHEN 325 MG PO TABS: 650.0000 mg | ORAL_TABLET | Freq: Four times a day (QID) | ORAL | Status: DC | PRN

## 2022-01-14 MED ORDER — KETOROLAC TROMETHAMINE 15 MG/ML IJ SOLN
15.0000 mg | Freq: Once | INTRAMUSCULAR | Status: AC
  Administered 2022-01-14: 15 mg via INTRAVENOUS
  Filled 2022-01-14: qty 1

## 2022-01-14 MED ORDER — ONDANSETRON HCL 4 MG/2ML IJ SOLN: 4.0000 mg | Freq: Four times a day (QID) | INTRAMUSCULAR | Status: DC | PRN

## 2022-01-14 NOTE — ED Provider Notes (Signed)
[facility] EMERGENCY DEPARTMENT Provider Note   CSN: [id number] Arrival date & time: 01/14/22  2130     History  Chief Complaint  Patient presents with   Chest Pain   Back Pain    Kenneth Bautista is a 36 y.o. male.  With PMH of anxiety, depression, asthma, polysubstance use, GERD presenting with chest pain, upper abdominal pain, nausea and vomiting. He does endorse smoking crack cocaine prior to this yesterday, denies IVDU or recent EtOH use.  His upper abdominal pain and substernal nonradiating chest pain was stabbing in nature associate with nausea and nonbloody nonbilious emesis.  He denies any fevers, URI symptoms, diarrhea, UTI symptoms, shortness of breath, cough, hemoptysis, leg pain or swelling.  He smoked crack cocaine yesterday before his symptoms started.  No history of PE or DVT.  Not on any anticoagulation.  Denies any hematemesis, melena, hematochezia.  Denies any change of symptoms with eating.   Chest Pain Associated symptoms: back pain   Back Pain Associated symptoms: chest pain        Home Medications Prior to Admission medications   Medication Sig Start Date End Date Taking? Authorizing Provider  albuterol (VENTOLIN HFA) 108 (90 Base) MCG/ACT inhaler Inhale 2 puffs into the lungs every 6 (six) hours as needed for wheezing or shortness of breath. 02/26/21   Angiulli, Lavon Paganini, PA-C  aspirin EC 325 MG tablet Take 1 tablet (325 mg total) by mouth daily. 07/30/21   Vanetta Mulders, MD  Cholecalciferol (DIALYVITE VITAMIN D 5000) 125 MCG (5000 UT) capsule Take 5,000 Units by mouth daily.    [provider]  gabapentin (NEURONTIN) 100 MG capsule TAKE 2 CAPSULES BY MOUTH THREE TIMES DAILY 11/29/21   Dorna Mai, MD  hydrOXYzine (ATARAX) 25 MG tablet Take 1 tablet (25 mg total) by mouth 3 (three) times daily as needed. for anxiety Patient taking differently: Take 25 mg by mouth 2 (two) times daily. 07/13/21   Dorna Mai, MD  methocarbamol  (ROBAXIN) 500 MG tablet Take 1 tablet (500 mg total) by mouth 3 (three) times daily. 11/28/21   Dorna Mai, MD  nicotine (NICODERM CQ - DOSED IN MG/24 HOURS) 14 mg/24hr patch Apply one 14 mg patch daily x2 weeks then 7 mg patch daily x3 weeks and stop 02/26/21   Angiulli, Lavon Paganini, PA-C  nicotine (NICODERM CQ) 21 mg/24hr patch Place 1 patch (21 mg total) onto the skin daily. 11/09/21 02/01/22  Vanetta Mulders, MD  oxyCODONE (OXY IR/ROXICODONE) 5 MG immediate release tablet Take 1 tablet (5 mg total) by mouth every 4 (four) hours as needed (severe pain). 07/30/21   Vanetta Mulders, MD  PARoxetine (PAXIL) 40 MG tablet Take 1 tablet (40 mg total) by mouth daily. 11/28/21 11/28/22  Dorna Mai, MD  traMADol (ULTRAM) 50 MG tablet Take 50 mg by mouth every 6 (six) hours as needed. 11/12/21   [provider]  traZODone (DESYREL) 100 MG tablet Take 2 tablets (200 mg total) by mouth at bedtime. 11/28/21   Dorna Mai, MD      Allergies    Patient has no known allergies.    Review of Systems   Review of Systems  Cardiovascular:  Positive for chest pain.  Musculoskeletal:  Positive for back pain.    Physical Exam Updated Vital Signs BP (!) 128/116   Pulse 78   Temp 98.6 F (37 C)   Resp 16   Ht '5\' 6"'  (1.676 m)   Wt 68 kg  SpO2 99%   BMI 24.21 kg/m  Physical Exam Constitutional: Alert and oriented.  Uncomfortable writhing in bed but no acute distress Eyes: Conjunctivae are normal. ENT      Head: Normocephalic and atraumatic.      Nose: No congestion.      Mouth/Throat: Mucous membranes are moist.      Neck: No stridor. Cardiovascular: S1, S2,  Normal and symmetric distal pulses are present in all extremities.Warm and well perfused.  Equal palpable DP pulses Respiratory: Tachypneic mid 30s. breath sounds are normal.  O2 sat 99 on room air Gastrointestinal: Soft and epigastrium tenderness, no right upper quadrant pain, negative Murphy sign, no rebound, no guarding   musculoskeletal: Normal range of motion in all extremities.      Right lower leg: No tenderness or edema.      Left lower leg: No tenderness or edema. Neurologic: Normal speech and language.  Moving extremities equally.  No sensation loss.  No gross focal neurologic deficits are appreciated. Skin: Skin is warm, dry and intact. No rash noted. Psychiatric: Mood and affect are normal. Speech and behavior are normal.  ED Results / Procedures / Treatments   Labs (all labs ordered are listed, but only abnormal results are displayed) Labs Reviewed  COMPREHENSIVE METABOLIC PANEL - Abnormal; Notable for the following components:      Result Value   Sodium 126 (*)    Potassium 2.9 (*)    Chloride 93 (*)    CO2 20 (*)    Glucose, Bld 117 (*)    Alkaline Phosphatase 218 (*)    All other components within normal limits  CBC WITH DIFFERENTIAL/PLATELET - Abnormal; Notable for the following components:   Neutro Abs 7.8 (*)    All other components within normal limits  LIPASE, BLOOD  MAGNESIUM  URINALYSIS, ROUTINE W REFLEX MICROSCOPIC  SODIUM, URINE, RANDOM  RAPID URINE DRUG SCREEN, HOSP PERFORMED  BASIC METABOLIC PANEL  CBG MONITORING, ED  TROPONIN I (HIGH SENSITIVITY)  TROPONIN I (HIGH SENSITIVITY)    EKG EKG Interpretation  Date/Time:  Monday January 14 2022 09:38:07 EDT Ventricular Rate:  80 PR Interval:  112 QRS Duration: 102 QT Interval:  446 QTC Calculation: 514 R Axis:   90 Text Interpretation: Normal sinus rhythm with sinus arrhythmia Rightward axis Prolonged QT Abnormal ECG When compared with ECG of 18-Oct-2020 14:32, PREVIOUS ECG IS PRESENT QTc 514 , biphasic T in V1/V2 Confirmed by Georgina Snell [phone]) on 01/14/2022 11:27:13 AM  Radiology CT Angio Chest/Abd/Pel for Dissection W and/or Wo Contrast  Result Date: 01/14/2022 CLINICAL DATA:  Chest and abdominal pain after smoking crack cocaine EXAM: CT ANGIOGRAPHY CHEST, ABDOMEN AND PELVIS TECHNIQUE: Non-contrast CT of the  chest was initially obtained. Multidetector CT imaging through the chest, abdomen and pelvis was performed using the standard protocol during bolus administration of intravenous contrast. Multiplanar reconstructed images and MIPs were obtained and reviewed to evaluate the vascular anatomy. RADIATION DOSE REDUCTION: This exam was performed according to the departmental dose-optimization program which includes automated exposure control, adjustment of the mA and/or kV according to patient size and/or use of iterative reconstruction technique. CONTRAST:  149m OMNIPAQUE IOHEXOL 350 MG/ML SOLN COMPARISON:  01/14/2022 FINDINGS: CTA CHEST FINDINGS Cardiovascular: No evidence of thoracic aortic aneurysm or dissection. The heart is unremarkable without pericardial effusion. While not optimized for opacification of the pulmonary vasculature, there is sufficient opacification of the pulmonary vasculature to exclude pulmonary emboli. No filling defects are identified. Mediastinum/Nodes: No enlarged mediastinal, hilar, or  axillary lymph nodes. Thyroid gland, trachea, and esophagus demonstrate no significant findings. Lungs/Pleura: Stable emphysematous changes, with subpleural bullous seen within the bilateral lower lobes. Minimal ground-glass consolidation within the left upper lobe in the perihilar region, which could be related to hemorrhage or underlying inflammatory change related to recent illicit drug use. No dense consolidation, effusion, or pneumothorax. Central airways are patent. Musculoskeletal: No acute or destructive bony lesions. Chronic nonunion left acromion fracture. Reconstructed images demonstrate no additional findings. Review of the MIP images confirms the above findings. CTA ABDOMEN AND PELVIS FINDINGS VASCULAR Aorta: Normal caliber aorta without aneurysm, dissection, vasculitis or significant stenosis. Celiac: Patent without evidence of aneurysm, dissection, vasculitis or significant stenosis. SMA: Patent  without evidence of aneurysm, dissection, vasculitis or significant stenosis. 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 The renal arteries are patent without evidence of aneurysm, dissection, vasculitis, fibromuscular dysplasia or significant stenosis. IMA: Patent without evidence of aneurysm, dissection, vasculitis or significant stenosis. Inflow: Patent without evidence of aneurysm, dissection, vasculitis or significant stenosis. Veins: No obvious venous abnormality within the limitations of this arterial phase study. Review of the MIP images confirms the above findings. NON-VASCULAR Hepatobiliary: Multiple calcified gallstones are identified the gallbladder lumen. Mild nonspecific wall thickening of the gallbladder fundus measuring 5-6 mm, with no evidence of pericholecystic fat stranding or free fluid. Heterogeneous decreased attenuation of the liver consistent with hepatic steatosis, with areas of fatty sparing near the gallbladder fossa. No biliary duct dilation. Pancreas: Unremarkable. No pancreatic ductal dilatation or surrounding inflammatory changes. Spleen: Normal in size without focal abnormality. Adrenals/Urinary Tract: Adrenal glands are unremarkable. Kidneys are normal, without renal calculi, focal lesion, or hydronephrosis. Bladder is unremarkable. Stomach/Bowel: No bowel obstruction or ileus. Normal retrocecal appendix. No bowel wall thickening or inflammatory change. Scattered colonic diverticulosis without diverticulitis. Lymphatic: No pathologic adenopathy. Reproductive: Prostate is unremarkable. Other: No free fluid or free intraperitoneal gas. No abdominal wall hernia. Musculoskeletal: No acute or destructive bony lesions. Reconstructed images demonstrate no additional findings. Review of the MIP images confirms the above findings. IMPRESSION: 1. No evidence of thoracoabdominal aortic aneurysm or dissection. 2. No evidence of pulmonary embolus. 3. Minimal left upper lobe  ground-glass airspace disease, which may reflect focal inflammatory change or hemorrhage given history of recent illicit drug inhalation. 4. Cholelithiasis, with nonspecific gallbladder wall thickening as above. There is no pericholecystic free fluid or fat stranding to suggest acute cholecystitis. If acute cholecystitis is a clinical concern, ultrasound or nuclear medicine hepatobiliary scan could be considered. 5. Hepatic steatosis. 6.  Emphysema (ICD10-J43.9). Electronically Signed   By: Randa Ngo M.D.   On: 01/14/2022 13:32   DG Chest 1 View  Result Date: 01/14/2022 CLINICAL DATA:  Chest pain and shortness of breath EXAM: CHEST  1 VIEW COMPARISON:  January 27, 2021 FINDINGS: The heart size and mediastinal contours are within normal limits. Both lungs are clear. The visualized skeletal structures are unremarkable. IMPRESSION: No acute cardiopulmonary abnormality. Electronically Signed   By: Beryle Flock M.D.   On: 01/14/2022 10:05    Procedures Procedures  Remain on constant cardiac monitoring, normal sinus rhythm  Medications Ordered in ED Medications  enoxaparin (LOVENOX) injection 40 mg (has no administration in time range)  sodium chloride flush (NS) 0.9 % injection 3 mL (has no administration in time range)  potassium chloride SA (KLOR-CON M) CR tablet 40 mEq (has no administration in time range)  0.9 %  sodium chloride infusion (has no administration in time range)  acetaminophen (TYLENOL) tablet 650  mg (has no administration in time range)    Or  acetaminophen (TYLENOL) suppository 650 mg (has no administration in time range)  ondansetron (ZOFRAN) tablet 4 mg (has no administration in time range)    Or  ondansetron (ZOFRAN) injection 4 mg (has no administration in time range)  albuterol (PROVENTIL) (2.5 MG/3ML) 0.083% nebulizer solution 2.5 mg (has no administration in time range)  ondansetron (ZOFRAN) injection 4 mg (4 mg Intravenous Given 01/14/22 1209)  0.9% NaCl bolus  PEDS (0 mLs Intravenous Stopped 01/14/22 1551)  diazepam (VALIUM) injection 5 mg (5 mg Intravenous Given 01/14/22 1216)  ketorolac (TORADOL) 15 MG/ML injection 15 mg (15 mg Intravenous Given 01/14/22 1212)  famotidine (PEPCID) IVPB 20 mg premix (0 mg Intravenous Stopped 01/14/22 1551)  potassium chloride SA (KLOR-CON M) CR tablet 40 mEq (40 mEq Oral Given 01/14/22 1208)  iohexol (OMNIPAQUE) 350 MG/ML injection 100 mL (100 mLs Intravenous Contrast Given 01/14/22 1322)    ED Course/ Medical Decision Making/ A&P Clinical Course as of 01/14/22 1647  Mon Jan 14, 2022  1320 EKG obtained with biphasic T waves V1 V2 and prolonged QTc patient's labs remarkable for initial high sensitive troponin 7, repeat down trended to 6.  Reassuring in the sense unlikely NSTEMI.  His lab work was also notable for hyponatremia 126, hypokalemia 2.9, hypochloremia 93 likely from reported vomiting.  Bicarbonate 20 anion gap 13.  Glucose 117.  Do not suspect DKA.  Magnesium 1.7.  Normal lipase 21, unlikely pancreatitis, normal AST and ALT was slightly elevated alk phos 218. [VB]  [id number] Patient CTA negative for dissection.  Noted to have cholelithiasis but no evidence of cholecystitis, no right upper quadrant pain on exam and no longer vomiting, unlikely symptomatic cholelithiasis.  He is still endorsing some mild substernal chest pain, EKG with the biphasic T waves but downtrending troponin, unlikely NSTEMI but with his persistent pain, electrolyte abnormalities hyponatremia, hypokalemia, and CT with evidence of possible hemorrhage versus inflammation of left upper lobe from cocaine use, will page for admission and observation.  Holding off from antibiotics as he denies any infectious symptoms, cough, URI symptoms. [VB]  4098 Discussed case with hospitalist who will evaluate patient and put in orders for admission [VB]    Clinical Course User Index [VB] Elgie Congo, MD                           Medical Decision  Making Kenneth Bautista is a 36 y.o. male.  With PMH of anxiety, depression, asthma, polysubstance use, GERD presenting with chest pain, upper abdominal pain, nausea and vomiting.   Patient presented with chest pain and upper abdominal pain in the setting of crack cocaine use.  EKG biphasic T waves with prolonged QTc and reassuring repeat troponins.  Unlikely atypical ACS with reassuring troponins x2.  He had no pulse deficits or focal neurologic deficits but due to complaints of pain radiating to back and severe pain with vomiting, obtain CTA chest abdomen pelvis dissection scan which was negative but noted to have consolidation in the left upper lobe concerning for hemorrhage or inflammation from crack cocaine use.  He was also noted to have cholelithiasis without cholecystitis.  His lab work was remarkable for hyponatremia, AKI and hypokalemia likely all hypovolemic in nature due to persistent vomiting.  I reassessed patient, symptoms improved with Valium, suspect likely GERD contributing to pain, no pancreatitis based off lipase and scan results.  We will hold off  from antibiotics due to no complaints of URI or infectious symptoms prior to presentation.  Patient received IV fluids for hyponatremia normal saline bolus and potassium repletion and admitted for continued cardiac pulse ox monitoring for possible pulmonary hemorrhage, continued IV repletion and potassium repletion.  Amount and/or Complexity of Data Reviewed Labs: ordered. Decision-making details documented in ED Course. Radiology: ordered and independent interpretation performed. Decision-making details documented in ED Course. ECG/medicine tests: independent interpretation performed. Decision-making details documented in ED Course.  Risk Prescription drug management. Decision regarding hospitalization.    Final Clinical Impression(s) / ED Diagnoses Final diagnoses:  Chest pain, unspecified type  Nausea and vomiting, unspecified  vomiting type  Abdominal pain, unspecified abdominal location  Hypokalemia  Hyponatremia  Calculus of gallbladder without cholecystitis without obstruction    Rx / DC Orders ED Discharge Orders     None         Elgie Congo, MD 01/14/22 1647

## 2022-01-14 NOTE — ED Notes (Signed)
Pt stated he cant urinate, pt has specimen cup
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
## 2022-01-14 NOTE — ED Triage Notes (Signed)
Pt bib ems from home c.o chest pain that started 7 hours ago with n/v. Sharp 9/10 pain. Pt also reports abd pain that started after he was vomiting.  Pt given, 4mg  zofran, 324 ASA and 2 Nitro with relief of chest pain. Pt reports he has not been able to sleep or eat in the past 2 days.   129/81 HR 98 100% CBG 121

## 2022-01-14 NOTE — ED Provider Triage Note (Addendum)
Emergency Medicine Provider Triage Evaluation Note  Kenneth Bautista , a 36 y.o. male  was evaluated in triage.  Pt complains of chest pain, back pain, abdominal pain.  Patient states that this all began last night.  He describes having chest pain "all over."  His back pain is in the left flank.  He is unable to describe the abdominal pain.  He has had nausea and vomiting.  Denies dysuria, shortness of breath or fevers. States that he called EMS last night for the same, however was "not ready to get out of bed yet."  EMS gave for Zofran, 325 aspirin, 2 nitro. Last crack cocaine use last night  Review of Systems  Positive:  Negative:   Physical Exam  BP (!) 150/115   Pulse 80   Temp 97.6 F (36.4 C)   Resp (!) 34   Ht 5\' 6"  (1.676 m)   Wt 68 kg   SpO2 100%   BMI 24.21 kg/m  Gen:   Awake, acute distress, rocking back and forth on exam Resp:  Tachypneic, lungs clear bilaterally MSK:   Moves extremities without difficulty  Other:    Medical Decision Making  Medically screening exam initiated at 9:32 AM.  Appropriate orders placed.  TEVAN MARIAN was informed that the remainder of the evaluation will be completed by another provider, this initial triage assessment does not replace that evaluation, and the importance of remaining in the ED until their evaluation is complete.     [name], PA-C 01/14/22 0934    01/16/22, PA-C 01/14/22 [phone]

## 2022-01-15 ENCOUNTER — Observation Stay (HOSPITAL_BASED_OUTPATIENT_CLINIC_OR_DEPARTMENT_OTHER): Payer: 59

## 2022-01-15 ENCOUNTER — Encounter (HOSPITAL_COMMUNITY): Payer: Self-pay | Admitting: Internal Medicine

## 2022-01-15 DIAGNOSIS — R079 Chest pain, unspecified: Secondary | ICD-10-CM | POA: Diagnosis not present

## 2022-01-15 DIAGNOSIS — K8020 Calculus of gallbladder without cholecystitis without obstruction: Secondary | ICD-10-CM | POA: Diagnosis not present

## 2022-01-15 LAB — RAPID URINE DRUG SCREEN, HOSP PERFORMED
Amphetamines: NOT DETECTED
Barbiturates: NOT DETECTED
Benzodiazepines: POSITIVE — AB
Cocaine: POSITIVE — AB
Opiates: NOT DETECTED
Tetrahydrocannabinol: NOT DETECTED

## 2022-01-15 LAB — COMPREHENSIVE METABOLIC PANEL
ALT: 13 U/L (ref 0–44)
AST: 10 U/L — ABNORMAL LOW (ref 15–41)
Albumin: 3.3 g/dL — ABNORMAL LOW (ref 3.5–5.0)
Alkaline Phosphatase: 168 U/L — ABNORMAL HIGH (ref 38–126)
Anion gap: 6 (ref 5–15)
BUN: <5 mg/dL — ABNORMAL LOW (ref 6–20)
CO2: 23 mmol/L (ref 22–32)
Calcium: 8.6 mg/dL — ABNORMAL LOW (ref 8.9–10.3)
Chloride: 102 mmol/L (ref 98–111)
Creatinine, Ser: 0.73 mg/dL (ref 0.61–1.24)
GFR, Estimated: >60 mL/min (ref >60)
Glucose, Bld: 91 mg/dL (ref 70–99)
Potassium: 4.1 mmol/L (ref 3.5–5.1)
Sodium: 131 mmol/L — ABNORMAL LOW (ref 135–145)
Total Bilirubin: 0.6 mg/dL (ref 0.3–1.2)
Total Protein: 5.6 g/dL — ABNORMAL LOW (ref 6.5–8.1)

## 2022-01-15 LAB — URINALYSIS, ROUTINE W REFLEX MICROSCOPIC
Bilirubin Urine: NEGATIVE
Glucose, UA: NEGATIVE mg/dL
Hgb urine dipstick: NEGATIVE
Ketones, ur: 20 mg/dL — AB
Leukocytes,Ua: NEGATIVE
Nitrite: NEGATIVE
Protein, ur: NEGATIVE mg/dL
Specific Gravity, Urine: >1.046 — ABNORMAL HIGH (ref 1.005–1.030)
pH: 8 (ref 5.0–8.0)

## 2022-01-15 LAB — ECHOCARDIOGRAM COMPLETE
Area-P 1/2: 3.48 cm2
Height: 66 in
S' Lateral: 3.6 cm
Weight: 2400 oz

## 2022-01-15 LAB — SODIUM, URINE, RANDOM: Sodium, Ur: 79 mmol/L

## 2022-01-15 NOTE — Discharge Summary (Signed)
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 with probable articular surface extension. Electronically Signed   By: Jasmine Pang M.D.   On: 12/17/2021 22:01    Microbiology: Results for orders placed or performed during the hospital encounter of 01/26/21  Resp Panel by RT-PCR (Flu A&B, Covid) Nasopharyngeal Swab     Status: None   Collection Time: 01/26/21 11:22 PM   Specimen: Nasopharyngeal Swab; Nasopharyngeal(NP) swabs in vial transport medium  Result Value Ref Range Status   SARS Coronavirus 2 by RT PCR NEGATIVE NEGATIVE Final    Comment: (NOTE) SARS-CoV-2 target nucleic acids are NOT DETECTED.  The SARS-CoV-2 RNA is generally detectable in upper respiratory specimens during the acute phase of infection. The lowest concentration of SARS-CoV-2 viral copies this assay can detect is 138 copies/mL. A negative result does not preclude SARS-Cov-2 infection and should not be used as the sole basis for treatment or other patient management decisions. A negative result may occur with  improper specimen collection/handling, submission of specimen other than nasopharyngeal swab, presence of viral mutation(s) within the areas targeted by  this assay, and inadequate number of viral copies(<138 copies/mL). A negative result must be combined with clinical observations, patient history, and epidemiological information. The expected result is Negative.  Fact Sheet for Patients:  BloggerCourse.com  Fact Sheet for Healthcare Providers:  SeriousBroker.it  This test is no t yet approved or cleared by the  [place] [employer] and  has been authorized for detection and/or diagnosis of SARS-CoV-2 by FDA under an TEFL teacher (EUA). This EUA will remain  in effect (meaning this test can be used) for the duration of the COVID-19 declaration under Section 564(b)(1) of the Act, 21 U.S.C.section 360bbb-3(b)(1), unless the authorization is terminated  or revoked sooner.       Influenza A by PCR NEGATIVE NEGATIVE Final   Influenza B by PCR NEGATIVE NEGATIVE Final    Comment: (NOTE) The Xpert Xpress SARS-CoV-2/FLU/RSV plus assay is intended as an aid in the diagnosis of influenza from Nasopharyngeal swab specimens and should not be used as a sole basis for treatment. Nasal washings and aspirates are unacceptable for Xpert Xpress SARS-CoV-2/FLU/RSV testing.  Fact Sheet for Patients: BloggerCourse.com  Fact Sheet for Healthcare Providers: SeriousBroker.it  This test is not yet approved or cleared by the [place] FDA and has been authorized for detection and/or diagnosis of SARS-CoV-2 by FDA under an Emergency Use Authorization (EUA). This EUA will remain in effect (meaning this test can be used) for the duration of the COVID-19 declaration under Section 564(b)(1) of the Act, 21 U.S.C. section 360bbb-3(b)(1), unless the authorization is terminated or revoked.  Performed at [facility], [address] [place], [place] [address]   MRSA Next Gen by PCR, Nasal     Status: None   Collection Time: 01/27/21   6:21 AM   Specimen: Nasal Mucosa; Nasal Swab  Result Value Ref Range Status   MRSA by PCR Next Gen NOT DETECTED NOT DETECTED Final    Comment: (NOTE) The GeneXpert MRSA Assay (FDA approved for NASAL specimens only), is one component of a comprehensive MRSA colonization surveillance program. It is not intended to diagnose MRSA infection nor to guide or monitor treatment for MRSA infections. Test performance is not FDA approved in patients less than 59 years old. Performed at [facility], [address], [place], [place] [address]   Surgical PCR screen     Status: None   Collection Time: 01/27/21  9:55 AM   Specimen: Nasal Mucosa; Nasal Swab  Result Value Ref Range Status   MRSA, PCR NEGATIVE NEGATIVE Final   Staphylococcus aureus NEGATIVE NEGATIVE Final    Comment: (NOTE) The Xpert SA Assay (FDA approved for NASAL specimens in patients 37 years of age and older), is one component of a comprehensive surveillance program. It is not intended to diagnose infection nor to guide or monitor treatment. Performed at [facility], [address], [place], [place] [address]     Labs: CBC: Recent Labs  Lab 01/14/22 0942  WBC 9.5  NEUTROABS 7.8*  HGB 14.7  HCT 40.8  MCV 85.4  PLT 373   Basic Metabolic Panel: Recent Labs  Lab 01/14/22 0942 01/14/22 1147 01/15/22 0346  NA 126*  --  131*  K 2.9*  --  4.1  CL 93*  --  102  CO2 20*  --  23  GLUCOSE 117*  --  91  BUN 6  --  <5*  CREATININE 0.87  --  0.73  CALCIUM 9.3  --  8.6*  MG  --  1.7  --    Liver Function Tests: Recent Labs  Lab 01/14/22 0942 01/15/22 0346  AST 18 10*  ALT 16 13  ALKPHOS 218* 168*  BILITOT 0.9 0.6  PROT 6.8 5.6*  ALBUMIN 4.3 3.3*   CBG: No results for input(s): "GLUCAP" in the last  168 hours.  Discharge time spent: less than 30 minutes.  Signed: Rickey Barbara, MD [facility] 01/15/2022

## 2022-01-17 ENCOUNTER — Other Ambulatory Visit: Payer: Self-pay | Admitting: Family Medicine

## 2022-01-18 NOTE — Progress Notes (Signed)
This encounter was created in error - please disregard.

## 2022-01-19 ENCOUNTER — Other Ambulatory Visit: Payer: Self-pay | Admitting: Family Medicine

## 2022-01-19 DIAGNOSIS — T07XXXA Unspecified multiple injuries, initial encounter: Secondary | ICD-10-CM

## 2022-02-15 ENCOUNTER — Ambulatory Visit (HOSPITAL_BASED_OUTPATIENT_CLINIC_OR_DEPARTMENT_OTHER): Payer: 59 | Admitting: Orthopaedic Surgery

## 2022-04-29 ENCOUNTER — Other Ambulatory Visit: Payer: Self-pay

## 2022-04-29 ENCOUNTER — Emergency Department (HOSPITAL_COMMUNITY)
Admission: EM | Admit: 2022-04-29 | Discharge: 2022-04-29 | Disposition: A | Discharge status: Home / Self Care | Payer: Self-pay | Attending: Emergency Medicine | Admitting: Emergency Medicine

## 2022-04-29 ENCOUNTER — Encounter (HOSPITAL_COMMUNITY): Payer: Self-pay

## 2022-04-29 ENCOUNTER — Emergency Department (HOSPITAL_COMMUNITY): Payer: Self-pay

## 2022-04-29 DIAGNOSIS — M25562 Pain in left knee: Secondary | ICD-10-CM | POA: Insufficient documentation

## 2022-04-29 DIAGNOSIS — W501XXA Accidental kick by another person, initial encounter: Secondary | ICD-10-CM | POA: Insufficient documentation

## 2022-04-29 MED ORDER — KETOROLAC TROMETHAMINE 10 MG PO TABS: 10.0000 mg | ORAL_TABLET | Freq: Four times a day (QID) | ORAL | 0 refills | Status: DC | PRN

## 2022-04-29 MED ORDER — HYDROCODONE-ACETAMINOPHEN 5-325 MG PO TABS
1.0000 | ORAL_TABLET | Freq: Once | ORAL | Status: AC
  Administered 2022-04-29: 1 via ORAL
  Filled 2022-04-29: qty 1

## 2022-04-29 NOTE — Discharge Instructions (Addendum)
Follow-up with your orthopedic doctor.  Use the knee immobilizer at all times, and use crutches as needed.  If you develop redness, swelling of the area please return to the ER.  I have prescribed you a prescription anti-inflammatory, there is an increased risk of GI bleeding, if you have black stool, bright red blood in your stool, or vomiting blood please return to the ER.

## 2022-04-29 NOTE — ED Notes (Signed)
Shift report received, assumed care of patient at this time 
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
## 2022-04-29 NOTE — Progress Notes (Signed)
Orthopedic Tech Progress Note Patient Details:  Kenneth Bautista 1986/02/27 [id number]  Ortho Devices Type of Ortho Device: Knee Immobilizer, Crutches Ortho Device/Splint Location: LLE Ortho Device/Splint Interventions: Application, Adjustment, Ordered   Post Interventions Patient Tolerated: Well Instructions Provided: Adjustment of device  [name] [name] 04/29/2022, 7:37 PM
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
## 2022-05-15 ENCOUNTER — Ambulatory Visit (HOSPITAL_BASED_OUTPATIENT_CLINIC_OR_DEPARTMENT_OTHER): Payer: Self-pay | Admitting: Orthopaedic Surgery

## 2022-05-17 ENCOUNTER — Ambulatory Visit (HOSPITAL_BASED_OUTPATIENT_CLINIC_OR_DEPARTMENT_OTHER): Payer: Self-pay | Admitting: Orthopaedic Surgery

## 2022-05-29 ENCOUNTER — Telehealth: Payer: Self-pay | Admitting: Orthopaedic Surgery

## 2022-05-29 NOTE — Telephone Encounter (Signed)
Pt called to reschedule appt. I seen pt note says not to reschedule without permission form Dr Steward Drone. Please call pt at [phone].

## 2022-06-11 NOTE — Congregational Nurse Program (Signed)
  Dept: [phone]   Congregational Nurse Program Note  Date of Encounter: 06/11/2022 Client to [facility] day center with request for assistance with medications and obtaining a local PCP. [facility] application completed, RN to drop off. Client reports he does get disability, this would automatically qualify him for disability. He reports that the staff at [employer] is assisting him with his application. RN to assist with [facility] appointment when application is approved. Client aware and in agreement.  Past Medical History: Past Medical History:  Diagnosis Date   Arthritis    Asthma    Atypical angina    Dyspnea    Epigastric hernia    GERD (gastroesophageal reflux disease)    History of 2019 novel coronavirus disease (COVID-19) 06/24/2019   Pneumonia    Polysubstance abuse ([place])    ETOH, marijuana, cocaine, amphetamines   Tachycardia     Encounter Details:  CNP Questionnaire - 06/11/22 0930       Questionnaire   Ask client: Do you give verbal consent for me to treat you today? Yes    Student Assistance N/A    Location Patient [place]    Visit Setting with Client Organization    Patient Status Unhoused    Insurance Uninsured ([employer] Card/Care Connects/Self-Pay/Medicaid Family Planning)    Insurance/Financial Assistance Referral N/A   need assistance with applying for Medicaid, reports he is receiving help form the staff at [employer]   Medication Have Medication Insecurities   reports he has been with out his medicaitiuons for "months"   Medical Provider Yes   client reports his PCP is in [place], needs local PCP   Screening Referrals Made N/A    Medical Referrals Made Non-[facility] PCP/Clinic   [facility] application completed   Medical Appointment Made N/A    Recently w/o PCP, now 1st time PCP visit completed due to CNs referral or appointment made N/A    Food Have Food Insecurities    Transportation N/A   would need transportation assitance  for medical appointments   Housing/Utilities No permanent housing    Interpersonal Safety N/A    Interventions Advocate/Support;Navigate Healthcare System;Case Management    Abnormal to Normal Screening Since Last CN Visit N/A    Screenings CN Performed N/A    Sent Client to Lab for: N/A    Did client attend any of the following based off CNs referral or appointments made? N/A    ED Visit Averted N/A    Life-Saving Intervention Made N/A

## 2022-06-13 ENCOUNTER — Other Ambulatory Visit: Payer: Self-pay

## 2022-06-13 ENCOUNTER — Emergency Department: Payer: No Typology Code available for payment source

## 2022-06-13 ENCOUNTER — Emergency Department
Admission: EM | Admit: 2022-06-13 | Discharge: 2022-06-13 | Disposition: A | Discharge status: Home / Self Care | Payer: No Typology Code available for payment source | Attending: Student in an Organized Health Care Education/Training Program | Admitting: Student in an Organized Health Care Education/Training Program

## 2022-06-13 DIAGNOSIS — X501XXA Overexertion from prolonged static or awkward postures, initial encounter: Secondary | ICD-10-CM | POA: Diagnosis not present

## 2022-06-13 DIAGNOSIS — Y9301 Activity, walking, marching and hiking: Secondary | ICD-10-CM | POA: Insufficient documentation

## 2022-06-13 DIAGNOSIS — Y92410 Unspecified street and highway as the place of occurrence of the external cause: Secondary | ICD-10-CM | POA: Insufficient documentation

## 2022-06-13 DIAGNOSIS — M25561 Pain in right knee: Secondary | ICD-10-CM

## 2022-06-13 MED ORDER — OXYCODONE-ACETAMINOPHEN 5-325 MG PO TABS
1.0000 | ORAL_TABLET | Freq: Once | ORAL | Status: AC
  Administered 2022-06-13: 1 via ORAL
  Filled 2022-06-13: qty 1

## 2022-06-13 MED ORDER — OXYCODONE-ACETAMINOPHEN 5-325 MG PO TABS: 1.0000 | ORAL_TABLET | ORAL | 0 refills | Status: DC | PRN

## 2022-06-13 NOTE — Congregational Nurse Program (Signed)
  Dept: [phone]   Congregational Nurse Program Note  Date of Encounter: 06/13/2022 Client to [facility] day center and advises that he has an apt [facility] on Tuesday 1[facility]16 at 49 am. RN to provide bus passes for him to attend this appointment. He reports the staff at [employer] is assisting him with his application for Medicaid. He is receiving disability and should qualify for Medicaid. No other needs at this time. Past Medical History: Past Medical History:  Diagnosis Date   Arthritis    Asthma    Atypical angina    Dyspnea    Epigastric hernia    GERD (gastroesophageal reflux disease)    History of 2019 novel coronavirus disease (COVID-19) 06/24/2019   Pneumonia    Polysubstance abuse ([place])    ETOH, marijuana, cocaine, amphetamines   Tachycardia     Encounter Details:  CNP Questionnaire - 06/13/22 0910       Questionnaire   Ask client: Do you give verbal consent for me to treat you today? Yes    Student Assistance N/A    Location Patient [place]    Visit Setting with Client Organization    Patient Status Unhoused    Insurance Uninsured ([employer]/Care Connects/Self-Pay/Medicaid Family Planning)    Insurance/Financial Assistance Referral N/A   need assistance with applying for Medicaid, reports he is receiving help form the staff at [employer]   Medication Have Medication Insecurities   reports he has been with out his medicaitiuons for "months"   Medical Provider Yes   client reports his PCP is in [place], needs local PCP   Screening Referrals Made N/A    Medical Referrals Made [facility] PCP/Clinic   [facility] application completed   Medical Appointment Made [facility] PCP/clinic   [facility] apt 1/16 at 9 am   Recently w/o PCP, now 1st time PCP visit completed due to CNs referral or appointment made [place] Insecurities    Transportation N/A   RN to provide [employer] bus pass for [facility] apt on 1/16    Housing/Utilities No permanent housing    Interpersonal Safety N/A    Interventions Advocate/Support;Educate    Abnormal to Normal Screening Since Last CN Visit N/A    Screenings CN Performed N/A    Sent Client to Lab for: N/A    Did client attend any of the following based off CNs referral or appointments made? N/A    ED Visit Averted N/A    Life-Saving Intervention Made N/A
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
## 2022-06-17 ENCOUNTER — Other Ambulatory Visit: Payer: Self-pay

## 2022-06-17 ENCOUNTER — Emergency Department: Payer: No Typology Code available for payment source

## 2022-06-17 ENCOUNTER — Emergency Department
Admission: EM | Admit: 2022-06-17 | Discharge: 2022-06-17 | Disposition: A | Discharge status: Home / Self Care | Payer: No Typology Code available for payment source | Attending: Emergency Medicine | Admitting: Emergency Medicine

## 2022-06-17 DIAGNOSIS — Y9301 Activity, walking, marching and hiking: Secondary | ICD-10-CM | POA: Insufficient documentation

## 2022-06-17 DIAGNOSIS — Z7982 Long term (current) use of aspirin: Secondary | ICD-10-CM | POA: Insufficient documentation

## 2022-06-17 DIAGNOSIS — X501XXA Overexertion from prolonged static or awkward postures, initial encounter: Secondary | ICD-10-CM | POA: Insufficient documentation

## 2022-06-17 DIAGNOSIS — S8391XA Sprain of unspecified site of right knee, initial encounter: Secondary | ICD-10-CM

## 2022-06-17 DIAGNOSIS — J45909 Unspecified asthma, uncomplicated: Secondary | ICD-10-CM | POA: Insufficient documentation

## 2022-06-17 DIAGNOSIS — S8991XA Unspecified injury of right lower leg, initial encounter: Secondary | ICD-10-CM | POA: Diagnosis present

## 2022-06-17 MED ORDER — IBUPROFEN 600 MG PO TABS: 600.0000 mg | ORAL_TABLET | Freq: Three times a day (TID) | ORAL | 0 refills | Status: DC | PRN

## 2022-06-17 MED ORDER — HYDROCODONE-ACETAMINOPHEN 5-325 MG PO TABS: 1.0000 | ORAL_TABLET | Freq: Four times a day (QID) | ORAL | 0 refills | Status: DC | PRN

## 2022-06-17 MED ORDER — KETOROLAC TROMETHAMINE 60 MG/2ML IM SOLN
30.0000 mg | Freq: Once | INTRAMUSCULAR | Status: AC
  Administered 2022-06-17: 30 mg via INTRAMUSCULAR
  Filled 2022-06-17: qty 2

## 2022-06-17 MED ORDER — HYDROCODONE-ACETAMINOPHEN 5-325 MG PO TABS
1.0000 | ORAL_TABLET | Freq: Once | ORAL | Status: AC
  Administered 2022-06-17: 1 via ORAL
  Filled 2022-06-17: qty 1
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
## 2022-06-17 NOTE — ED Triage Notes (Incomplete)
R

## 2022-06-17 NOTE — Discharge Instructions (Signed)
You may take pain medicines as needed (Motrin/Norco).  Elevate affected area and apply ice several times daily.  Return to the ER for worsening symptoms or other concerns.

## 2022-06-18 ENCOUNTER — Ambulatory Visit: Payer: Self-pay | Admitting: Gerontology

## 2022-06-19 ENCOUNTER — Ambulatory Visit: Payer: Self-pay | Admitting: Gerontology

## 2022-06-20 NOTE — Congregational Nurse Program (Signed)
  Dept: [phone]   Congregational Nurse Program Note  Date of Encounter: 06/20/2022 Client to [facility] clinic as a follow up to his apt at [facility] on 1/17. Client reports he was unable to be seen at the [facility] because they showed he had insurance. Client then showed this Rn his insurance card. Rn contacted the member services number on the card and verified that indeed, client does have insurance, it appears thorough the exchange/Affordable care act. RN now to assist client with finding a local Md practice that will take this insurance. Of note client has copays for appointments and medications. Client was previously living in [place] and had a PCP there. Rn to continue to proivide support and find services for this client. Past Medical History: Past Medical History:  Diagnosis Date   Arthritis    Asthma    Atypical angina    Dyspnea    Epigastric hernia    GERD (gastroesophageal reflux disease)    History of 2019 novel coronavirus disease (COVID-19) 06/24/2019   Pneumonia    Polysubstance abuse ([place])    ETOH, marijuana, cocaine, amphetamines   Tachycardia     Encounter Details:  CNP Questionnaire - 06/20/22 0930       Questionnaire   Ask client: Do you give verbal consent for me to treat you today? Yes    Student Assistance N/A    Location Patient [place]    Visit Setting with Client Organization    Patient Status Unhoused    Sport and exercise psychologist or [place]   client has insurance throught the exchange, he does  NOT have [place]. Verified with [facility] finacial naviagtor.   Insurance/Financial Assistance Referral N/A   need assistance with applying for Medicaid, reports he is receiving help form the staff at [employer]   Medication Have Medication Insecurities   client was unaware he was insured will need a local PCP. RN to assist with transferring of prescriptions if possible to a local pharmacy   Medical Provider Yes   client reports  his PCP is in [place], needs local PCP. Cannot be seen at the [facility] since he has insurance   Screening Referrals Made N/A    Medical Referrals Made N/A    Medical Appointment Made N/A   [facility] apt 1/16 at 9 am   Recently w/o PCP, now 1st time PCP visit completed due to CNs referral or appointment made [place] Insecurities    Transportation N/A    Housing/Utilities No permanent housing    Interpersonal Safety N/A    Interventions Advocate/Support;Educate;Navigate Healthcare System;Case Management    Abnormal to Normal Screening Since Last CN Visit N/A    Screenings CN Performed N/A    Sent Client to Lab for: N/A    Did client attend any of the following based off CNs referral or appointments made? N/A    ED Visit Averted N/A    Life-Saving Intervention Made N/A

## 2022-06-28 ENCOUNTER — Ambulatory Visit (HOSPITAL_BASED_OUTPATIENT_CLINIC_OR_DEPARTMENT_OTHER): Payer: No Typology Code available for payment source | Admitting: Orthopaedic Surgery

## 2022-06-28 ENCOUNTER — Telehealth: Payer: Self-pay | Admitting: Orthopaedic Surgery

## 2022-06-28 NOTE — Telephone Encounter (Signed)
Patient wants to if he can schedule an appointment. I told him that he has no showed 3 times and is not allowed to scheduled until he speaks with Dr B. Please call patient to advise [phone]

## 2022-07-01 ENCOUNTER — Telehealth: Payer: Self-pay | Admitting: Orthopaedic Surgery

## 2022-07-01 NOTE — Telephone Encounter (Signed)
Pt called to reschedule appt. Note on appt that Dr Sammuel Hines need to approve appt due to [place]. Please call pt at [phone]
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
## 2022-07-05 ENCOUNTER — Ambulatory Visit: Payer: No Typology Code available for payment source | Admitting: Physician Assistant
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
## 2022-07-12 ENCOUNTER — Other Ambulatory Visit: Payer: Self-pay

## 2022-07-12 DIAGNOSIS — M25562 Pain in left knee: Secondary | ICD-10-CM | POA: Diagnosis present

## 2022-07-12 DIAGNOSIS — G894 Chronic pain syndrome: Secondary | ICD-10-CM | POA: Diagnosis not present

## 2022-07-12 DIAGNOSIS — M79662 Pain in left lower leg: Secondary | ICD-10-CM | POA: Insufficient documentation
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
## 2022-07-13 ENCOUNTER — Emergency Department: Payer: No Typology Code available for payment source

## 2022-07-13 ENCOUNTER — Emergency Department
Admission: EM | Admit: 2022-07-13 | Discharge: 2022-07-13 | Disposition: A | Discharge status: Home / Self Care | Payer: No Typology Code available for payment source | Attending: Emergency Medicine | Admitting: Emergency Medicine

## 2022-07-13 DIAGNOSIS — G894 Chronic pain syndrome: Secondary | ICD-10-CM

## 2022-07-13 MED ORDER — OXYCODONE HCL 5 MG PO TABS
5.0000 mg | ORAL_TABLET | Freq: Once | ORAL | Status: AC
  Administered 2022-07-13: 5 mg via ORAL
  Filled 2022-07-13: qty 1

## 2022-07-13 MED ORDER — KETOROLAC TROMETHAMINE 30 MG/ML IJ SOLN
30.0000 mg | Freq: Once | INTRAMUSCULAR | Status: AC
  Administered 2022-07-13: 30 mg via INTRAMUSCULAR
  Filled 2022-07-13: qty 1

## 2022-07-13 MED ORDER — ACETAMINOPHEN 500 MG PO TABS: 1000.0000 mg | ORAL_TABLET | Freq: Once | ORAL | Status: DC

## 2022-07-13 NOTE — ED Notes (Signed)
Pt brought to ED hallway 1, this RN now assuming care.

## 2022-07-13 NOTE — ED Notes (Signed)
ED Provider at bedside. 

## 2022-07-13 NOTE — ED Notes (Signed)
Pt verbalized understanding of DC instructions. Signing pad did not work.

## 2022-07-13 NOTE — ED Notes (Signed)
Pt informed this RN he has been taking 12-15 531m tylenol tablets a day. MD notified.

## 2022-07-13 NOTE — ED Provider Notes (Signed)
[facility] Provider Note    Event Date/Time   First MD Initiated Contact with Patient 07/13/22 0012     (approximate)   History   Leg Pain   HPI  Kenneth Bautista is a 37 y.o. male who presents to the ED for evaluation of Leg Pain   I reviewed orthopedic clinic visit from August 2023.  History of chronic pain syndrome, polysubstance abuse.   He presents to the ED with concerns for acute on chronic left knee and shin pain after a twisting injury that occurred 2 days ago.  Reports feeling like his shin is swollen and painful proximally on the left.  He has still been ambulatory with a cane around his baseline.  No other injuries beyond a twisting mechanism   Physical Exam   Triage Vital Signs: ED Triage Vitals  Enc Vitals Group     BP 07/12/22 2309 107/70     Pulse Rate 07/12/22 2309 81     Resp 07/12/22 2309 16     Temp 07/12/22 2309 (!) 97.5 F (36.4 C)     Temp Source 07/12/22 2309 Oral     SpO2 07/12/22 2309 99 %     Weight 07/12/22 2308 145 lb (65.8 kg)     Height 07/12/22 2308 5' 6"$  (1.676 m)     Head Circumference --      Peak Flow --      Pain Score 07/12/22 2309 8     Pain Loc --      Pain Edu? --      Excl. in [place]? --     Most recent vital signs: Vitals:   07/12/22 2309  BP: 107/70  Pulse: 81  Resp: 16  Temp: (!) 97.5 F (36.4 C)  SpO2: 99%    General: Awake, no distress.  CV:  Good peripheral perfusion.  Resp:  Normal effort.  Abd:  No distention.  MSK:  No deformity noted.  Well-healed surgical incision over the proximal left tibia and over his knee.  Minimal appreciable anterior swelling to the proximal shin inferior to his knee.  Somewhat tender over this area, but no evidence of bony step-offs, laceration or open injury. Neuro:  No focal deficits appreciated. Other:     ED Results / Procedures / Treatments   Labs (all labs ordered are listed, but only abnormal results are displayed) Labs Reviewed - No data  to display  EKG   RADIOLOGY Plain film of the left tib-fib interpreted by me without evidence of fracture or dislocation.  Official radiology report(s): DG Tibia/Fibula Left  Result Date: 07/13/2022 CLINICAL DATA:  twisting injury, sensation of swelling to proximal shin EXAM: LEFT TIBIA AND FIBULA - 2 VIEW COMPARISON:  X-ray left tibia fibula 04/29/2022 FINDINGS: Old healed proximal tibial and fibular fracture status post plate and screw fixation of the tibia. Similar-appearing peri-prosthetic lucency along the tibial screws. There is no evidence of fracture or other focal bone lesions. Soft tissues are unremarkable. IMPRESSION: 1. No acute displaced fracture or dislocation. 2. Old healed proximal tibial and fibular fracture status post plate and screw fixation of the tibia. Similar-appearing peri-prosthetic lucency along the tibial screws. Electronically Signed   By: Iven Finn M.D.   On: 07/13/2022 01:18    PROCEDURES and INTERVENTIONS:  Procedures  Medications  acetaminophen (TYLENOL) tablet 1,000 mg (1,000 mg Oral Not Given 07/13/22 0040)  ketorolac (TORADOL) 30 MG/ML injection 30 mg (30 mg Intramuscular Given 07/13/22 0041)  oxyCODONE (  Oxy IR/ROXICODONE) immediate release tablet 5 mg (5 mg Oral Given 07/13/22 0041)     IMPRESSION / MDM / ASSESSMENT AND PLAN / ED COURSE  I reviewed the triage vital signs and the nursing notes.  Differential diagnosis includes, but is not limited to, chronic pain, ligamentous injury, fracture, dislocation  37 year old presents to the ED with acute on chronic pain, possibly of soft tissue etiology and ultimately suitable for outpatient management.  Looks systemically well.  Reports a twisting mechanism 2 days ago.  X-rays without evidence of fracture or dislocation.  Old hardware in place.  I offered a brace but he declines.  We discussed return precautions.  He is ambulatory at his baseline with a cane  Clinical Course as of 07/13/22 0226  Sat  Jul 13, 2022  0126 Reassessed.  Improved pain.  We discussed x-rays.  I offered a brace and he declines indicating he has 3 at home [DS]    Clinical Course User Index [DS] Vladimir Crofts, MD     FINAL CLINICAL IMPRESSION(S) / ED DIAGNOSES   Final diagnoses:  Chronic pain syndrome     Rx / DC Orders   ED Discharge Orders     None        Note:  This document was prepared using Dragon voice recognition software and may include unintentional dictation errors.   Vladimir Crofts, MD 07/13/22 [phone]

## 2022-07-16 ENCOUNTER — Emergency Department: Payer: No Typology Code available for payment source

## 2022-07-16 ENCOUNTER — Other Ambulatory Visit: Payer: Self-pay

## 2022-07-16 ENCOUNTER — Emergency Department
Admission: EM | Admit: 2022-07-16 | Discharge: 2022-07-17 | Disposition: A | Discharge status: Home / Self Care | Payer: No Typology Code available for payment source | Attending: Emergency Medicine | Admitting: Emergency Medicine

## 2022-07-16 DIAGNOSIS — G8929 Other chronic pain: Secondary | ICD-10-CM | POA: Diagnosis not present

## 2022-07-16 DIAGNOSIS — J45909 Unspecified asthma, uncomplicated: Secondary | ICD-10-CM | POA: Diagnosis not present

## 2022-07-16 DIAGNOSIS — M25561 Pain in right knee: Secondary | ICD-10-CM | POA: Insufficient documentation

## 2022-07-16 DIAGNOSIS — Z7982 Long term (current) use of aspirin: Secondary | ICD-10-CM | POA: Diagnosis not present

## 2022-07-16 DIAGNOSIS — Z5900 Homelessness unspecified: Secondary | ICD-10-CM | POA: Insufficient documentation

## 2022-07-16 MED ORDER — NAPROXEN 500 MG PO TABS
500.0000 mg | ORAL_TABLET | Freq: Once | ORAL | Status: AC
  Administered 2022-07-17: 500 mg via ORAL
  Filled 2022-07-16: qty 1

## 2022-07-16 NOTE — ED Triage Notes (Signed)
Pt presents to ER with c/o right leg pain.  Pt states he started noticing some pain and popping sound in his right knee area when walking. Pt denies new injury to his right leg.  Pt is ambulatory to triage with a cane.  Pt is otherwise A&O x4 and in NAD in triage.
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
## 2022-07-17 ENCOUNTER — Emergency Department: Payer: No Typology Code available for payment source

## 2022-07-17 DIAGNOSIS — M25561 Pain in right knee: Secondary | ICD-10-CM | POA: Diagnosis not present

## 2022-07-17 NOTE — ED Notes (Signed)
Pt Dc to home. Dc instructions reviewed with all questions answered. Pt ambulatory out of dept with can without difficulty

## 2022-07-17 NOTE — Discharge Instructions (Signed)
Your x-ray showed no new changes today.  You need to follow-up with your orthopedist for management of the chronic pain in your right knee.   You may alternate Tylenol 1000 mg every 6 hours as needed for pain, fever and Ibuprofen 800 mg every 6-8 hours as needed for pain, fever.  Please take Ibuprofen with food.  Do not take more than 4000 mg of Tylenol (acetaminophen) in a 24 hour period.

## 2022-07-24 ENCOUNTER — Telehealth: Payer: Self-pay | Admitting: Orthopaedic Surgery

## 2022-07-24 NOTE — Telephone Encounter (Signed)
Patient needs appointment to see [facility]. Please advise

## 2022-07-29 ENCOUNTER — Telehealth: Payer: Self-pay | Admitting: Physical Medicine and Rehabilitation

## 2022-07-29 NOTE — Telephone Encounter (Signed)
Spoke with patient and scheduled OV for 07/31/22

## 2022-07-29 NOTE — Telephone Encounter (Signed)
Patient requesting an appointment with Dr. Ernestina Patches
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
## 2022-07-30 ENCOUNTER — Ambulatory Visit (INDEPENDENT_AMBULATORY_CARE_PROVIDER_SITE_OTHER): Payer: No Typology Code available for payment source | Admitting: Family Medicine

## 2022-07-30 ENCOUNTER — Encounter: Payer: Self-pay | Admitting: Family Medicine

## 2022-07-30 VITALS — BP 126/81 | HR 86 | Temp 98.3°F | Resp 16 | Wt 150.0 lb

## 2022-07-30 DIAGNOSIS — F32A Depression, unspecified: Secondary | ICD-10-CM | POA: Diagnosis not present

## 2022-07-30 DIAGNOSIS — Z87828 Personal history of other (healed) physical injury and trauma: Secondary | ICD-10-CM

## 2022-07-30 DIAGNOSIS — G4700 Insomnia, unspecified: Secondary | ICD-10-CM

## 2022-07-30 DIAGNOSIS — F419 Anxiety disorder, unspecified: Secondary | ICD-10-CM

## 2022-07-30 MED ORDER — TRAMADOL HCL 50 MG PO TABS: 50.0000 mg | ORAL_TABLET | Freq: Three times a day (TID) | ORAL | 0 refills | Status: AC | PRN

## 2022-07-30 MED ORDER — TRAZODONE HCL 100 MG PO TABS: 200.0000 mg | ORAL_TABLET | Freq: Every day | ORAL | 3 refills | Status: DC

## 2022-07-30 MED ORDER — METHOCARBAMOL 500 MG PO TABS: 500.0000 mg | ORAL_TABLET | Freq: Four times a day (QID) | ORAL | 3 refills | Status: DC | PRN

## 2022-07-30 MED ORDER — PAROXETINE HCL 40 MG PO TABS: 40.0000 mg | ORAL_TABLET | Freq: Every day | ORAL | 1 refills | Status: DC

## 2022-07-30 MED ORDER — GABAPENTIN 100 MG PO CAPS: 200.0000 mg | ORAL_CAPSULE | Freq: Three times a day (TID) | ORAL | 3 refills | Status: DC
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
## 2022-07-30 NOTE — Progress Notes (Signed)
Medication management- would like to start medications  Medication for muscle relaxer and medication refill

## 2022-07-31 ENCOUNTER — Ambulatory Visit: Payer: No Typology Code available for payment source | Admitting: Physical Medicine and Rehabilitation

## 2022-08-20 ENCOUNTER — Ambulatory Visit: Payer: No Typology Code available for payment source | Admitting: Family Medicine

## 2022-09-03 ENCOUNTER — Emergency Department
Admission: EM | Admit: 2022-09-03 | Discharge: 2022-09-03 | Disposition: A | Discharge status: Home / Self Care | Payer: No Typology Code available for payment source | Attending: Emergency Medicine | Admitting: Emergency Medicine

## 2022-09-03 ENCOUNTER — Emergency Department: Payer: No Typology Code available for payment source

## 2022-09-03 ENCOUNTER — Other Ambulatory Visit: Payer: Self-pay

## 2022-09-03 ENCOUNTER — Encounter: Payer: Self-pay | Admitting: Emergency Medicine

## 2022-09-03 DIAGNOSIS — R0789 Other chest pain: Secondary | ICD-10-CM | POA: Diagnosis not present

## 2022-09-03 DIAGNOSIS — R0602 Shortness of breath: Secondary | ICD-10-CM | POA: Diagnosis not present

## 2022-09-03 DIAGNOSIS — F17200 Nicotine dependence, unspecified, uncomplicated: Secondary | ICD-10-CM | POA: Diagnosis not present

## 2022-09-03 DIAGNOSIS — R079 Chest pain, unspecified: Secondary | ICD-10-CM

## 2022-09-03 DIAGNOSIS — Z5900 Homelessness unspecified: Secondary | ICD-10-CM | POA: Diagnosis not present

## 2022-09-03 LAB — TROPONIN I (HIGH SENSITIVITY)
Troponin I (High Sensitivity): 3 ng/L (ref <18)
Troponin I (High Sensitivity): <2 ng/L (ref <18)

## 2022-09-03 LAB — CBC
HCT: 42.1 % (ref 39.0–52.0)
Hemoglobin: 14.1 g/dL (ref 13.0–17.0)
MCH: 30.7 pg (ref 26.0–34.0)
MCHC: 33.5 g/dL (ref 30.0–36.0)
MCV: 91.5 fL (ref 80.0–100.0)
Platelets: 409 10*3/uL — ABNORMAL HIGH (ref 150–400)
RBC: 4.6 MIL/uL (ref 4.22–5.81)
RDW: 13.8 % (ref 11.5–15.5)
WBC: 6.2 10*3/uL (ref 4.0–10.5)
nRBC: 0 % (ref 0.0–0.2)

## 2022-09-03 LAB — BASIC METABOLIC PANEL
Anion gap: 10 (ref 5–15)
BUN: 11 mg/dL (ref 6–20)
CO2: 25 mmol/L (ref 22–32)
Calcium: 9.2 mg/dL (ref 8.9–10.3)
Chloride: 103 mmol/L (ref 98–111)
Creatinine, Ser: 0.69 mg/dL (ref 0.61–1.24)
GFR, Estimated: >60 mL/min (ref >60)
Glucose, Bld: 90 mg/dL (ref 70–99)
Potassium: 3.3 mmol/L — ABNORMAL LOW (ref 3.5–5.1)
Sodium: 138 mmol/L (ref 135–145)

## 2022-09-03 NOTE — ED Provider Notes (Signed)
[facility] Provider Note  Patient Contact: 4:53 PM (approximate)   History   Chest Pain   HPI  Kenneth Bautista is a 37 y.o. male with a history of polysubstance abuse, pneumonia and GERD, presents to the emergency department with left-sided chest pain that started yesterday morning.  Patient states that chest pain started after he experience increased stress as patient is struggling with homelessness.  Patient states that he has had some breathlessness.  No fever.  He states that he is a daily smoker.  No nausea, vomiting or abdominal pain.      Physical Exam   Triage Vital Signs: ED Triage Vitals  Enc Vitals Group     BP 09/03/22 1326 137/84     Pulse Rate 09/03/22 1326 85     Resp 09/03/22 1326 20     Temp 09/03/22 1326 98 F (36.7 C)     Temp Source 09/03/22 1326 Oral     SpO2 09/03/22 1326 98 %     Weight 09/03/22 1324 135 lb (61.2 kg)     Height 09/03/22 1324 5\' 6"  (1.676 m)     Head Circumference --      Peak Flow --      Pain Score 09/03/22 1324 7     Pain Loc --      Pain Edu? --      Excl. in [place]? --     Most recent vital signs: Vitals:   09/03/22 1326 09/03/22 1844  BP: 137/84 131/80  Pulse: 85 79  Resp: 20 15  Temp: 98 F (36.7 C)   SpO2: 98% 100%     General: Alert and in no acute distress. Eyes:  PERRL. EOMI. Head: No acute traumatic findings ENT:      Nose: No congestion/rhinnorhea.      Mouth/Throat: Mucous membranes are moist. Neck: No stridor. No cervical spine tenderness to palpation. Cardiovascular:  Good peripheral perfusion Respiratory: Normal respiratory effort without tachypnea or retractions. Lungs CTAB. Good air entry to the bases with no decreased or absent breath sounds. Gastrointestinal: Bowel sounds 4 quadrants. Soft and nontender to palpation. No guarding or rigidity. No palpable masses. No distention. No CVA tenderness. Musculoskeletal: Full range of motion to all extremities.  Neurologic:  No  gross focal neurologic deficits are appreciated.  Skin:   No rash noted    ED Results / Procedures / Treatments   Labs (all labs ordered are listed, but only abnormal results are displayed) Labs Reviewed  BASIC METABOLIC PANEL - Abnormal; Notable for the following components:      Result Value   Potassium 3.3 (*)    All other components within normal limits  CBC - Abnormal; Notable for the following components:   Platelets 409 (*)    All other components within normal limits  CBG MONITORING, ED  TROPONIN I (HIGH SENSITIVITY)  TROPONIN I (HIGH SENSITIVITY)  TROPONIN I (HIGH SENSITIVITY)     EKG  Normal sinus rhythm without ST segment elevation or other apparent arrhythmia.   RADIOLOGY  I personally viewed and evaluated these images as part of my medical decision making, as well as reviewing the written report by the radiologist.  ED Provider Interpretation: No acute abnormality on chest x-ray   PROCEDURES:  Critical Care performed: No  Procedures   MEDICATIONS ORDERED IN ED: Medications - No data to display   IMPRESSION / MDM / [place] / ED COURSE  I reviewed the triage vital  signs and the nursing notes.                              Assessment and plan Chest pain 37 year old male presents to the emergency department with left-sided chest pain that started yesterday morning.  Vital signs were reassuring at triage.  On exam, patient was alert and nontoxic-appearing.  Differential diagnosis includes STEMI, NSTEMI, pneumothorax, costochondritis, anxiety...  [place] troponin within range.  EKG indicates normal sinus rhythm without ST segment elevation or other apparent arrhythmia.  Chest x-ray unremarkable for pneumonia, pneumomediastinum or pneumothorax.  Upon reassessment, patient was resting comfortably and requesting discharge so that he can make his bus.  Return precautions were given to return with new or worsening symptoms.  All patient  questions were answered.        FINAL CLINICAL IMPRESSION(S) / ED DIAGNOSES   Final diagnoses:  Chest pain, unspecified type     Rx / DC Orders   ED Discharge Orders     None        Note:  This document was prepared using Dragon voice recognition software and may include unintentional dictation errors.   Vallarie Mare Churchill, PA-C 09/03/22 Randol Kern    Harvest Dark, MD 09/03/22 2217

## 2022-09-03 NOTE — ED Triage Notes (Signed)
Pt via POV from home. Pt c/o L sided chest pain and SOB  that started yesterday. States that he did have a "mild heart attack" a few years ago. Pt is A&OX4 and NAD

## 2022-09-03 NOTE — ED Provider Triage Note (Signed)
Emergency Medicine Provider Triage Evaluation Note  Kenneth Bautista , a 37 y.o. male  was evaluated in triage.  Pt complains of CP, SOB and memory loss that was worse yesterday.  States has had these problems before.    Review of Systems  Positive:  + hx of "mild heart attack"  Negative: No, N,V,D  Physical Exam  There were no vitals taken for this visit. Gen:   Awake, no distress  talkative Resp:  Normal effort  MSK:   Moves extremities without difficulty  Other:    Medical Decision Making  Medically screening exam initiated at 1:24 PM.  Appropriate orders placed.  Kenneth Bautista was informed that the remainder of the evaluation will be completed by another provider, this initial triage assessment does not replace that evaluation, and the importance of remaining in the ED until their evaluation is complete.     Kenneth Hai, PA-C 09/03/22 1330

## 2022-09-04 NOTE — Congregational Nurse Program (Signed)
  Dept: [phone]   Congregational Nurse Program Note  Date of Encounter: 09/04/2022 Client to [facility] day center for follow up to [facility] ED visit on 4/2. He had re[ported chest pain, poor memory and leg pain at the center yesterday. He also appeared to have some severe springtime/pollen allergies yesterday. He reports he is feeling better, no further chest pain. He is taking an over the counter antihistamine and reports improved allergy symptoms as well. Client is current with his PCP in [place], his next appt is in may. RN educated client on seeing his PCP if he feels unwell, he doesn't have to wait until his scheduled visit. Client voiced understanding. Ronn Melena BSN, RN Past Medical History: Past Medical History:  Diagnosis Date   Arthritis    Asthma    Atypical angina    Dyspnea    Epigastric hernia    GERD (gastroesophageal reflux disease)    History of 2019 novel coronavirus disease (COVID-19) 06/24/2019   Pneumonia    Polysubstance abuse    ETOH, marijuana, cocaine, amphetamines   Tachycardia     Encounter Details:  CNP Questionnaire - 09/04/22 0920       Questionnaire   Ask client: Do you give verbal consent for me to treat you today? Yes    Student Assistance N/A    Location Patient [place]    Visit Setting with Client Organization    Patient Status Unhoused    Sport and exercise psychologist or [place] Insurance;Medicaid   client has insurance throught the exchange, he does  NOT have [place]. Verified with [facility] finacial naviagtor.   Insurance/Financial Assistance Referral N/A   need assistance with applying for Medicaid, reports he is receiving help form the staff at [facility] Provider Yes   clinet has seen his PCP in [place] recently Dorna Mai MD   Screening Referrals Made N/A    Medical Referrals Made N/A    Medical Appointment Made N/A   [facility]16 at 9 am   Recently w/o PCP, now 1st time PCP visit  completed due to CNs referral or appointment made [place] [employer]    Transportation N/A    Housing/Utilities No permanent housing    Interpersonal Safety N/A    Interventions Advocate/Support;Educate    Abnormal to Normal Screening Since Last CN Visit N/A    Screenings CN Performed N/A    Sent Client to Lab for: N/A    Did client attend any of the following based off CNs referral or appointments made? N/A    ED Visit Averted N/A    Life-Saving Intervention Made N/A

## 2022-10-03 ENCOUNTER — Emergency Department
Admission: EM | Admit: 2022-10-03 | Discharge: 2022-10-03 | Disposition: A | Discharge status: Home / Self Care | Payer: No Typology Code available for payment source | Attending: Emergency Medicine | Admitting: Emergency Medicine

## 2022-10-03 ENCOUNTER — Emergency Department: Payer: No Typology Code available for payment source

## 2022-10-03 ENCOUNTER — Encounter: Payer: Self-pay | Admitting: Emergency Medicine

## 2022-10-03 DIAGNOSIS — S0990XA Unspecified injury of head, initial encounter: Secondary | ICD-10-CM | POA: Insufficient documentation

## 2022-10-03 DIAGNOSIS — R4182 Altered mental status, unspecified: Secondary | ICD-10-CM | POA: Diagnosis not present

## 2022-10-03 DIAGNOSIS — W500XXA Accidental hit or strike by another person, initial encounter: Secondary | ICD-10-CM | POA: Diagnosis not present

## 2022-10-03 DIAGNOSIS — W19XXXA Unspecified fall, initial encounter: Secondary | ICD-10-CM

## 2022-10-03 DIAGNOSIS — Z043 Encounter for examination and observation following other accident: Secondary | ICD-10-CM | POA: Diagnosis not present
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
## 2022-10-04 ENCOUNTER — Ambulatory Visit: Payer: Self-pay | Admitting: *Deleted

## 2022-10-04 NOTE — Telephone Encounter (Addendum)
Reason for Disposition  Patient sounds very sick or weak to the triager  Protocols used: Dizziness - Lightheadedness-A-AH
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
## 2022-10-11 ENCOUNTER — Ambulatory Visit: Payer: Self-pay | Admitting: *Deleted

## 2022-10-11 NOTE — Telephone Encounter (Signed)
Opened by mistake.

## 2022-10-25 ENCOUNTER — Ambulatory Visit (INDEPENDENT_AMBULATORY_CARE_PROVIDER_SITE_OTHER): Payer: 59 | Admitting: Family Medicine

## 2022-10-25 ENCOUNTER — Encounter: Payer: Self-pay | Admitting: Family Medicine

## 2022-10-25 VITALS — BP 99/63 | HR 62 | Temp 98.1°F | Resp 16 | Wt 152.4 lb

## 2022-10-25 DIAGNOSIS — F32A Depression, unspecified: Secondary | ICD-10-CM

## 2022-10-25 DIAGNOSIS — G894 Chronic pain syndrome: Secondary | ICD-10-CM | POA: Diagnosis not present

## 2022-10-25 DIAGNOSIS — G4700 Insomnia, unspecified: Secondary | ICD-10-CM | POA: Diagnosis not present

## 2022-10-25 DIAGNOSIS — F419 Anxiety disorder, unspecified: Secondary | ICD-10-CM | POA: Diagnosis not present

## 2022-10-25 DIAGNOSIS — M79604 Pain in right leg: Secondary | ICD-10-CM | POA: Insufficient documentation

## 2022-10-25 MED ORDER — PAROXETINE HCL 40 MG PO TABS: 40.0000 mg | ORAL_TABLET | Freq: Every day | ORAL | 1 refills | Status: DC

## 2022-10-25 MED ORDER — GABAPENTIN 300 MG PO CAPS: 300.0000 mg | ORAL_CAPSULE | Freq: Three times a day (TID) | ORAL | 3 refills | Status: DC

## 2022-10-25 MED ORDER — METHOCARBAMOL 500 MG PO TABS: 500.0000 mg | ORAL_TABLET | Freq: Four times a day (QID) | ORAL | 3 refills | Status: DC | PRN

## 2022-10-25 MED ORDER — TRAZODONE HCL 100 MG PO TABS: 200.0000 mg | ORAL_TABLET | Freq: Every day | ORAL | 3 refills | Status: DC
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
## 2022-10-30 ENCOUNTER — Ambulatory Visit: Payer: No Typology Code available for payment source | Admitting: Family Medicine

## 2022-10-31 ENCOUNTER — Encounter: Payer: Self-pay | Admitting: Family Medicine

## 2022-12-11 NOTE — Congregational Nurse Program (Signed)
  Dept: [phone]   Congregational Nurse Program Note  Date of Encounter: 12/11/2022 Client to [facility] day center with request for assistance in setting up a local PCP. His current PCP is in [place] and he plans to remain in [place]. Several phone calls made to find a PCP that takes his [employer] medicaid. New patient apt made at [facility] practice on 02/04/23 at 2:20 with Jethro Bastos PA. Appointment card written out and given to client. Rn to assist with setting up Medicaid transportation if needed. Client plans to call member services at [employer] to change his PCP and get a new insurance card. [name] BSN, RN Past Medical History: Past Medical History:  Diagnosis Date   Arthritis    Asthma    Atypical angina    Dyspnea    Epigastric hernia    GERD (gastroesophageal reflux disease)    History of 2019 novel coronavirus disease (COVID-19) 06/24/2019   Pneumonia    Polysubstance abuse (HCC)    ETOH, marijuana, cocaine, amphetamines   Tachycardia     Encounter Details:  CNP Questionnaire - 12/11/22 1015       Questionnaire   Ask client: Do you give verbal consent for me to treat you today? Yes    Student Assistance N/A    Location Patient Served  [name]    Visit Setting with Client Organization    Patient Status Unhoused    Insurance Medicaid   Client has [employer]   Insurance/Financial Assistance Referral N/A    Medication N/A    Medical Provider Yes   client has seen his PCP in [place] recently Georganna Skeans MD   Screening Referrals Made N/A    Medical Referrals Made [facility]   [facility] practice, new patient apt   Medical Appointment Made [facility] PCP[facility]cl[facility]   [facility] Practice 9/3 at 2:20 pm   Recently w/o PCP, now 1st time PCP visit completed due to CNs referral or appointment made N/A    Food Have Food Insecurities    Transportation N/A   Client has friends that given him rides, also now has Medicaid  transportation for Medical apts   Housing/Utilities No permanent housing    Interpersonal Safety N/A    Interventions Advocate/Support;Educate;Navigate Healthcare System    Abnormal to Normal Screening Since Last CN Visit N/A    Screenings CN Performed N/A    Sent Client to Lab for: N/A    Did client attend any of the following based off CNs referral or appointments made? N/A    ED Visit Averted N/A    Life-Saving Intervention Made N/A

## 2022-12-17 NOTE — Congregational Nurse Program (Signed)
  Dept: [phone]   Congregational Nurse Program Note  Date of Encounter: 12/17/2022 Client to [facility] Day center with request for assistance in retrieving a message in his [facility] He needed assistance in resetting his [facility] password. He had received a message that there was an earlier apt than his scheduled 9/3 apt at [facility]. However by the time he had reset his password the earlier appointment was no longer available. No other needs at this time. [name] BSN, RN Past Medical History: Past Medical History:  Diagnosis Date   Arthritis    Asthma    Atypical angina    Dyspnea    Epigastric hernia    GERD (gastroesophageal reflux disease)    History of 2019 novel coronavirus disease (COVID-19) 06/24/2019   Pneumonia    Polysubstance abuse (HCC)    ETOH, marijuana, cocaine, amphetamines   Tachycardia     Encounter Details:  CNP Questionnaire - 12/17/22 0930       Questionnaire   Ask client: Do you give verbal consent for me to treat you today? Yes    Student Assistance N/A    Location Patient Served  [facility]    Visit Setting with Client Organization    Patient Status Unhoused    Insurance Medicaid   Client has [employer]   Insurance/Financial Assistance Referral N/A    Medication N/A    Medical Provider Yes   client has seen his PCP in [place] recently Georganna Skeans MD   Screening Referrals Made N/A    Medical Referrals Made [facility]   [facility] practice, new patient apt   Medical Appointment Made [facility]   [facility] Practice 9/3 at 2:20 pm   Recently w/o PCP, now 1st time PCP visit completed due to CNs referral or appointment made N/A    Food Have Food Insecurities    Transportation N/A   Client has friends that given him rides, also now has Medicaid transportation for Medical apts   Housing/Utilities No permanent housing    Interpersonal Safety N/A    Interventions Advocate/Support;Set up MyChart    assisted client in resetting his MyChart password   Abnormal to Normal Screening Since Last CN Visit N/A    Screenings CN Performed N/A    Sent Client to Lab for: N/A    Did client attend any of the following based off CNs referral or appointments made? N/A    ED Visit Averted N/A    Life-Saving Intervention Made N/A

## 2022-12-25 NOTE — Congregational Nurse Program (Signed)
  Dept: [phone]   Congregational Nurse Program Note  Date of Encounter: 12/25/2022 Client to [facility] day center to advise Rn that he was given an earlier apt at [facility]. He not has an apt on 7/30 at 9:00 am. [name] BSN, RN Past Medical History: Past Medical History:  Diagnosis Date   Arthritis    Asthma    Atypical angina    Dyspnea    Epigastric hernia    GERD (gastroesophageal reflux disease)    History of 2019 novel coronavirus disease (COVID-19) 06/24/2019   Pneumonia    Polysubstance abuse (HCC)    ETOH, marijuana, cocaine, amphetamines   Tachycardia     Encounter Details:  CNP Questionnaire - 12/25/22 1058       Questionnaire   Ask client: Do you give verbal consent for me to treat you today? Yes    Student Assistance N/A    Location Patient Served  [facility]    Visit Setting with Client Organization    Patient Status Unhoused    Insurance Medicaid   Client has [employer]   Insurance/Financial Assistance Referral N/A    Medication N/A    Medical Provider Yes   client has seen his PCP in [place] recently Georganna Skeans MD   Screening Referrals Made N/A    Medical Referrals Made N/A    Medical Appointment Made [facility]   Client was notified that he now has an earlier apt. 7/30 at 9:00am   Recently w/o PCP, now 1st time PCP visit completed due to CNs referral or appointment made N/A    Food Have Food Insecurities    Transportation N/A   Client has friends that given him rides, also now has Medicaid transportation for Medical apts   Housing/Utilities No permanent housing    Interpersonal Safety N/A    Interventions Advocate/Support    Abnormal to Normal Screening Since Last CN Visit N/A    Screenings CN Performed N/A    Sent Client to Lab for: N/A    Did client attend any of the following based off CNs referral or appointments made? N/A    ED Visit Averted N/A    Life-Saving Intervention Made N/A

## 2022-12-31 ENCOUNTER — Encounter: Payer: Self-pay | Admitting: Physician Assistant

## 2022-12-31 ENCOUNTER — Ambulatory Visit: Payer: Medicaid Other | Admitting: Physician Assistant

## 2022-12-31 VITALS — BP 111/71 | HR 57 | Temp 97.9°F | Ht 66.0 in | Wt 152.4 lb

## 2022-12-31 DIAGNOSIS — G4700 Insomnia, unspecified: Secondary | ICD-10-CM | POA: Diagnosis not present

## 2022-12-31 DIAGNOSIS — F32A Depression, unspecified: Secondary | ICD-10-CM

## 2022-12-31 DIAGNOSIS — R413 Other amnesia: Secondary | ICD-10-CM

## 2022-12-31 DIAGNOSIS — Z7689 Persons encountering health services in other specified circumstances: Secondary | ICD-10-CM | POA: Diagnosis not present

## 2022-12-31 DIAGNOSIS — F419 Anxiety disorder, unspecified: Secondary | ICD-10-CM | POA: Diagnosis not present

## 2022-12-31 DIAGNOSIS — G894 Chronic pain syndrome: Secondary | ICD-10-CM

## 2022-12-31 DIAGNOSIS — J4520 Mild intermittent asthma, uncomplicated: Secondary | ICD-10-CM

## 2022-12-31 DIAGNOSIS — Z5900 Homelessness unspecified: Secondary | ICD-10-CM | POA: Diagnosis not present

## 2022-12-31 DIAGNOSIS — Z720 Tobacco use: Secondary | ICD-10-CM

## 2022-12-31 NOTE — Progress Notes (Signed)
New patient visit  Patient: Kenneth Bautista   DOB: 12-Dec-1985   37 y.o. Male  MRN: [id number] Visit Date: 12/31/2022  Today's healthcare provider: Debera Lat, PA-C   Chief Complaint  Patient presents with   New Patient (Initial Visit)   Subjective    Kenneth Bautista is a 37 y.o. male who presents today as a new patient to establish care.   Discussed the use of AI scribe software for clinical note transcription with the patient, who gave verbal consent to proceed.  History of Present Illness   The patient, with a history of asthma, traumatic brain injury, and leg injury, presents for medication refills. They recently moved from [place] and are establishing care. They report using albuterol for asthma as needed, most recently two weeks ago due to exertion in hot and humid weather. They smoke half a pack of cigarettes daily. They take aspirin for headaches related to their brain injury, and gabapentin three times daily for leg pain due to a previous accident. They also take Atarax daily for anxiety, paroxetine, and trazodone, but they ran out of trazodone two days ago. They report having insurance issues which prevented them from picking up their medications. They also report knee pain and have an upcoming appointment with orthopedics.        Past Medical History:  Diagnosis Date   Arthritis    Asthma    Atypical angina    Dyspnea    Epigastric hernia    GERD (gastroesophageal reflux disease)    History of 2019 novel coronavirus disease (COVID-19) 06/24/2019   Pneumonia    Polysubstance abuse (HCC)    ETOH, marijuana, cocaine, amphetamines   Tachycardia    Past Surgical History:  Procedure Laterality Date   ANTERIOR CRUCIATE LIGAMENT REPAIR Right 02/01/2021   Procedure: RECONSTRUCTION ANTERIOR CRUCIATE LIGAMENT (ACL);  Surgeon: Bjorn Pippin, MD;  Location: [facility] OR;  Service: Orthopedics;  Laterality: Right;   EPIGASTRIC HERNIA REPAIR N/A 11/15/2020   Procedure: HERNIA  REPAIR EPIGASTRIC ADULT, open;  Surgeon: Duanne Guess, MD;  Location: [facility] ORS;  Service: General;  Laterality: N/A;   EXTERNAL FIXATION LEG Right 01/27/2021   Procedure: EXTERNAL FIXATION KNEE;  Surgeon: Roby Lofts, MD;  Location: [facility] OR;  Service: Orthopedics;  Laterality: Right;   HERNIA REPAIR     KNEE ARTHROSCOPY Right 08/06/2021   Procedure: RIGHT KNEE ARTHROSCOPIC LYSIS OF ADHESIONS AND MUNIPULATION UNDER ANESTHESIA / POSSIBLE LATERAL EXRA-ARTICULAR TENODESIS;  Surgeon: Huel Cote, MD;  Location: [facility] OR;  Service: Orthopedics;  Laterality: Right;   None     ORIF ELBOW FRACTURE Right 01/27/2021   Procedure: OPEN REDUCTION INTERNAL FIXATION (ORIF) ELBOW/OLECRANON FRACTURE;  Surgeon: Roby Lofts, MD;  Location: [facility] OR;  Service: Orthopedics;  Laterality: Right;   ORIF TIBIA FRACTURE Left 01/27/2021   Procedure: OPEN REDUCTION INTERNAL FIXATION (ORIF) TIBIA FRACTURE;  Surgeon: Roby Lofts, MD;  Location: [facility] OR;  Service: Orthopedics;  Laterality: Left;   POSTERIOR CRUCIATE LIGAMENT RECONSTRUCTION Right 02/01/2021   Procedure: RECONSTRUCTION POSTERIOR CRUCIATE LIGAMENT (PCL);  Surgeon: Bjorn Pippin, MD;  Location: [facility] OR;  Service: Orthopedics;  Laterality: Right;   Family Status  Relation Name Status   Mother  (Not Specified)   Father  (Not Specified)  No partnership data on file   Family History  Problem Relation Age of Onset   Heart disease Mother        details unknown   Heart disease Father  details unknown   Social History   Socioeconomic History   Marital status: Single    Spouse name: Not on file   Number of children: Not on file   Years of education: Not on file   Highest education level: Not on file  Occupational History   Not on file  Tobacco Use   Smoking status: Every Day    Current packs/day: 0.50    Average packs/day: 0.5 packs/day for 20.0 years (10.0 ttl pk-yrs)    Types: Cigarettes   Smokeless tobacco: Never  Vaping Use   Vaping  status: Never Used  Substance and Sexual Activity   Alcohol use: Yes    Comment: none right now sober since accident.   Drug use: Not Currently    Types: Cocaine, Amphetamines, Marijuana    Comment: has not used drugs since Augest 1st   Sexual activity: Yes    Partners: Female  Other Topics Concern   Not on file  Social History Narrative   ** Merged History Encounter **       Social Determinants of Health   Financial Resource Strain: High Risk (04/25/2021)   Overall Financial Resource Strain (CARDIA)    Difficulty of Paying Living Expenses: Very hard  Food Insecurity: Food Insecurity Present (12/31/2022)   Hunger Vital Sign    Worried About Running Out of Food in the Last Year: Often true    Ran Out of Food in the Last Year: Often true  Transportation Needs: Unmet Transportation Needs (12/31/2022)   PRAPARE - Administrator, Civil Service (Medical): Yes    Lack of Transportation (Non-Medical): Yes  Physical Activity: Inactive (04/25/2021)   Exercise Vital Sign    Days of Exercise per Week: 0 days    Minutes of Exercise per Session: 0 min  Stress: Stress Concern Present (04/25/2021)   [employer] of Occupational Health - Occupational Stress Questionnaire    Feeling of Stress : Very much  Social Connections: Moderately Isolated (12/31/2022)   Social Connection and Isolation Panel [NHANES]    Frequency of Communication with Friends and Family: More than three times a week    Frequency of Social Gatherings with Friends and Family: More than three times a week    Attends Religious Services: More than 4 times per year    Active Member of [employer] or Organizations: No    Attends Banker Meetings: Never    Marital Status: Divorced   Outpatient Medications Prior to Visit  Medication Sig   albuterol (VENTOLIN HFA) 108 (90 Base) MCG/ACT inhaler Inhale 2 puffs into the lungs every 6 (six) hours as needed for wheezing or shortness of breath.   gabapentin  (NEURONTIN) 100 MG capsule Take 2 capsules (200 mg total) by mouth 3 (three) times daily.   hydrOXYzine (ATARAX) 25 MG tablet Take 1 tablet (25 mg total) by mouth 3 (three) times daily as needed. for anxiety   ibuprofen (ADVIL) 600 MG tablet Take 1 tablet (600 mg total) by mouth every 8 (eight) hours as needed.   ketorolac (TORADOL) 10 MG tablet Take 1 tablet (10 mg total) by mouth every 6 (six) hours as needed.   methocarbamol (ROBAXIN) 500 MG tablet Take 1 tablet (500 mg total) by mouth every 6 (six) hours as needed for muscle spasms.   PARoxetine (PAXIL) 40 MG tablet Take 1 tablet (40 mg total) by mouth daily.   traZODone (DESYREL) 100 MG tablet Take 2 tablets (200 mg total) by mouth at bedtime.  aspirin EC 325 MG tablet Take 1 tablet (325 mg total) by mouth daily. (Patient not taking: Reported on 12/31/2022)   gabapentin (NEURONTIN) 300 MG capsule Take 1 capsule (300 mg total) by mouth 3 (three) times daily. (Patient not taking: Reported on 12/31/2022)   HYDROcodone-acetaminophen (NORCO) 5-325 MG tablet Take 1 tablet by mouth every 6 (six) hours as needed for moderate pain. (Patient not taking: Reported on 12/31/2022)   nicotine (NICODERM CQ - DOSED IN MG/24 HOURS) 14 mg/24hr patch Apply one 14 mg patch daily x2 weeks then 7 mg patch daily x3 weeks and stop (Patient not taking: Reported on 12/31/2022)   oxyCODONE-acetaminophen (PERCOCET) 5-325 MG tablet Take 1 tablet by mouth every 4 (four) hours as needed for severe pain. (Patient not taking: Reported on 12/31/2022)   No facility-administered medications prior to visit.   No Known Allergies  Immunization History  Administered Date(s) Administered   DTaP 03/10/1997, 04/26/1997, 09/27/1997   HIB (PRP-OMP) 11/04/1988   MMR 08/23/1987, 01/31/1999   Td 01/31/1999   Tdap 09/07/2013, 01/26/2021    Health Maintenance  Topic Date Due   Hepatitis C Screening  Never done   COVID-19 Vaccine (1 - 2023-24 season) Never done   DTaP/Tdap/Td (6 - Td or  Tdap) 01/27/2031   HIV Screening  Completed   HPV VACCINES  Aged Out   INFLUENZA VACCINE  Discontinued    Patient Care Team: Debera Lat, PA-C as PCP - General (Physician Assistant) Christell Constant, MD as PCP - Cardiology (Cardiology)  Review of Systems  All other systems reviewed and are negative.  Except see HPI        Objective    BP 111/71 (BP Location: Right Arm, Patient Position: Sitting, Cuff Size: Large)   Pulse (!) 57   Temp 97.9 F (36.6 C) (Oral)   Ht 5\' 6"  (1.676 m)   Wt 152 lb 6.4 oz (69.1 kg)   SpO2 100%   BMI 24.60 kg/m      Physical Exam Vitals reviewed.  Constitutional:      General: He is not in acute distress.    Appearance: Normal appearance. He is not diaphoretic.  HENT:     Head: Normocephalic and atraumatic.  Eyes:     General: No scleral icterus.    Conjunctiva/sclera: Conjunctivae normal.  Cardiovascular:     Rate and Rhythm: Normal rate and regular rhythm.     Pulses: Normal pulses.     Heart sounds: Normal heart sounds. No murmur heard. Pulmonary:     Effort: Pulmonary effort is normal. No respiratory distress.     Breath sounds: Normal breath sounds. No wheezing or rhonchi.  Musculoskeletal:     Cervical back: Neck supple.     Right lower leg: No edema.     Left lower leg: No edema.  Lymphadenopathy:     Cervical: No cervical adenopathy.  Skin:    General: Skin is warm and dry.     Findings: No rash.  Neurological:     Mental Status: He is alert and oriented to person, place, and time. Mental status is at baseline.  Psychiatric:        Mood and Affect: Mood normal.        Behavior: Behavior normal.     Depression Screen    07/30/2022   10:30 AM 11/28/2021    2:14 PM 10/04/2021   11:47 AM 08/23/2021    1:30 PM  PHQ 2/9 Scores  PHQ - 2 Score 1 2  PHQ- 9 Score 12 21       Information is confidential and restricted. Go to Review Flowsheets to unlock data.   No results found for any visits on 12/31/22.   Assessment & Plan         Asthma: Chronic and stable. Infrequent use of Albuterol, last used two weeks ago due to exertion in hot weather. No recent pulmonologist visit. -Continue Albuterol as needed.  Tobacco Use: Chronic. Half a pack per day. No current plans to quit. -Encouraged to consider quitting. 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 On Gabapentin 300mg  three times a day. Knee Pain: Seeing orthopedic doctor in September. -Continue current treatment plan with orthopedics. -Continue Gabapentin 300mg  TID. Pt was scheduled to see orthopedics  Anxiety:  Insomnia Chronic and stable on Atarax daily. -Continue Atarax daily. Depression: Chronic and stable On Paroxetine and Trazodone. Out of Trazodone. Per dispensary hx, he has refills left. Advised to communicate with his pharmacy for assessment. -Continue Paroxetine. -Continue Trazodone.  Homeless Significantly limited by social determinant of health  General Health Maintenance: -Referral to Behavioral Health for adjustment and follow-up. -Check labs, last done several months ago. -Follow-up in three months.     Encounter to establish care Welcomed to our clinic Reviewed past medical hx, social hx, family hx and surgical hx Pt advised to send all vaccination records or screening   Return in about 3 months (around 04/02/2023) for chronic disease f/u.  The patient was advised to call back or seek an in-person evaluation if the symptoms worsen or if the condition fails to improve as anticipated.  I discussed the assessment and treatment plan with the patient. The patient was provided an opportunity to ask  questions and all were answered. The patient agreed with the plan and demonstrated an understanding of the instructions.  I, Debera Lat, PA-C have reviewed all documentation for this visit. The documentation on 12/31/22  for the exam, diagnosis, procedures, and orders are all accurate and complete.  Debera Lat, [facility], MMS [facility] [phone] (phone) [phone] (fax)     [facility]

## 2023-01-02 NOTE — Congregational Nurse Program (Signed)
  Dept: [phone]   Congregational Nurse Program Note  Date of Encounter: 01/02/2023 Client to [facility] day center to discuss his new patient apt at [facility]. He also wanted to change his pharmacy from [facility] in [facility] to [place] on [address] as it is easier for him to pick his medications up locally. RN offered assistance in transferring these prescriptions, client declined. Contact number given for [facility] pharmacy. Client also reports he has an Orthopedic apt in [place] in September, did not have the date at this time. He will have Medicaid transportation for this appointment. RN to assist as needed. [name] BSN, RN Past Medical History: Past Medical History:  Diagnosis Date   Arthritis    Asthma    Atypical angina    Dyspnea    Epigastric hernia    GERD (gastroesophageal reflux disease)    History of 2019 novel coronavirus disease (COVID-19) 06/24/2019   Pneumonia    Polysubstance abuse (HCC)    ETOH, marijuana, cocaine, amphetamines   Tachycardia     Encounter Details:  CNP Questionnaire - 01/02/23 1000       Questionnaire   Ask client: Do you give verbal consent for me to treat you today? Yes    Student Assistance N/A    Location Patient Served  [facility]    Visit Setting with Client Organization    Patient Status Unhoused    Insurance Medicaid   Client has [employer]   Insurance/Financial Assistance Referral N/A    Medication Have Medication Insecurities   client wants to change his pharmacy from [facility] to [facility] Provider Yes   now changed to Debera Lat at [facility]   Screening Referrals Made N/A    Medical Referrals Made N/A    Medical Appointment Made N/A    Recently w/o PCP, now 1st time PCP visit completed due to CNs referral or appointment made N/A   client had a PCP in [place], now changed to [facility] in [place], he did attend his new patient  apt there on 7/30   Food Have Food Insecurities    Transportation N/A   Client has friends that given him rides, also now has Medicaid transportation for Medical apts   Housing/Utilities No permanent housing    Economist N/A    Interventions Advocate/Support;Navigate Healthcare System    Abnormal to Normal Screening Since Last CN Visit N/A    Screenings CN Performed N/A    Sent Client to Lab for: N/A    Did client attend any of the following based off CNs referral or appointments made? N/A    ED Visit Averted N/A    Life-Saving Intervention Made N/A

## 2023-01-03 ENCOUNTER — Emergency Department: Payer: 59

## 2023-01-03 ENCOUNTER — Other Ambulatory Visit: Payer: Self-pay

## 2023-01-03 ENCOUNTER — Emergency Department
Admission: EM | Admit: 2023-01-03 | Discharge: 2023-01-03 | Disposition: A | Discharge status: Home / Self Care | Payer: 59 | Attending: Emergency Medicine | Admitting: Emergency Medicine

## 2023-01-03 DIAGNOSIS — M79605 Pain in left leg: Secondary | ICD-10-CM | POA: Diagnosis not present

## 2023-01-03 DIAGNOSIS — J45909 Unspecified asthma, uncomplicated: Secondary | ICD-10-CM | POA: Diagnosis not present

## 2023-01-03 DIAGNOSIS — S3992XA Unspecified injury of lower back, initial encounter: Secondary | ICD-10-CM | POA: Diagnosis not present

## 2023-01-03 DIAGNOSIS — M79604 Pain in right leg: Secondary | ICD-10-CM | POA: Diagnosis not present

## 2023-01-03 DIAGNOSIS — X500XXA Overexertion from strenuous movement or load, initial encounter: Secondary | ICD-10-CM | POA: Insufficient documentation

## 2023-01-03 DIAGNOSIS — S39012A Strain of muscle, fascia and tendon of lower back, initial encounter: Secondary | ICD-10-CM | POA: Insufficient documentation

## 2023-01-03 DIAGNOSIS — M545 Low back pain, unspecified: Secondary | ICD-10-CM | POA: Diagnosis not present

## 2023-01-03 MED ORDER — METHOCARBAMOL 500 MG PO TABS: 500.0000 mg | ORAL_TABLET | Freq: Three times a day (TID) | ORAL | 1 refills | Status: DC | PRN
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
## 2023-01-03 NOTE — Discharge Instructions (Signed)
Your exam and x-rays are normal return at the time.  No signs of a serious injury at this time.  You likely have some muscle strains due to your heavy lifting.  Take your home medications including the prescription Robaxin as directed.  Follow-up with your primary provider for ongoing complaints.
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
## 2023-01-07 ENCOUNTER — Encounter: Payer: Self-pay | Admitting: Physical Medicine and Rehabilitation

## 2023-01-07 ENCOUNTER — Ambulatory Visit (INDEPENDENT_AMBULATORY_CARE_PROVIDER_SITE_OTHER): Payer: 59 | Admitting: Physical Medicine and Rehabilitation

## 2023-01-07 DIAGNOSIS — M7918 Myalgia, other site: Secondary | ICD-10-CM | POA: Diagnosis not present

## 2023-01-07 DIAGNOSIS — M546 Pain in thoracic spine: Secondary | ICD-10-CM | POA: Diagnosis not present

## 2023-01-07 DIAGNOSIS — G8929 Other chronic pain: Secondary | ICD-10-CM

## 2023-01-07 DIAGNOSIS — M545 Low back pain, unspecified: Secondary | ICD-10-CM

## 2023-01-07 DIAGNOSIS — S39012A Strain of muscle, fascia and tendon of lower back, initial encounter: Secondary | ICD-10-CM | POA: Diagnosis not present

## 2023-01-07 MED ORDER — MELOXICAM 15 MG PO TABS: 15.0000 mg | ORAL_TABLET | Freq: Every day | ORAL | 0 refills | Status: DC

## 2023-01-07 MED ORDER — PREDNISONE 50 MG PO TABS
50.0000 mg | ORAL_TABLET | Freq: Every day | ORAL | 0 refills | Status: DC
Start: 2023-01-07 — End: 2023-04-10

## 2023-01-07 NOTE — Progress Notes (Signed)
Functional Pain Scale - descriptive words and definitions  Unmanageable (7)  Pain interferes with normal ADL's/nothing seems to help/sleep is very difficult/active distractions are very difficult to concentrate on. Severe range order  Average Pain 8-9  Lower back pain on both sides that radiates up to the mid back
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
## 2023-01-08 ENCOUNTER — Telehealth: Payer: Self-pay | Admitting: *Deleted

## 2023-01-08 NOTE — Transitions of Care (Post Inpatient/ED Visit) (Signed)
   01/08/2023  Name: Kenneth Bautista MRN: [id number] DOB: Oct 24, 1985  Today's TOC FU Call Status: Today's TOC FU Call Status:: Unsuccessful Call (1st Attempt) Unsuccessful Call (1st Attempt) Date: 01/08/23  Attempted to reach the patient regarding the most recent Inpatient/ED visit.  Follow Up Plan: Additional outreach attempts will be made to reach the patient to complete the Transitions of Care (Post Inpatient/ED visit) call.   Gean Maidens BSN RN [facility] [phone]

## 2023-01-09 ENCOUNTER — Telehealth: Payer: Self-pay | Admitting: *Deleted

## 2023-01-09 NOTE — Transitions of Care (Post Inpatient/ED Visit) (Signed)
   01/09/2023  Name: Kenneth Bautista MRN: [id number] DOB: 28-May-1986  Today's TOC FU Call Status: Today's TOC FU Call Status:: Unsuccessful Call (2nd Attempt) Unsuccessful Call (2nd Attempt) Date: 01/09/23  Attempted to reach the patient regarding the most recent Inpatient/ED visit.  Follow Up Plan: Additional outreach attempts will be made to reach the patient to complete the Transitions of Care (Post Inpatient/ED visit) call.   Gean Maidens BSN RN [facility] [phone]

## 2023-01-10 ENCOUNTER — Telehealth: Payer: Self-pay | Admitting: *Deleted

## 2023-01-10 NOTE — Transitions of Care (Post Inpatient/ED Visit) (Signed)
   01/10/2023  Name: Kenneth Bautista MRN: [id number] DOB: 1985/07/26  Today's TOC FU Call Status: Today's TOC FU Call Status:: Successful TOC FU Call Completed TOC FU Call Complete Date: 01/10/23  Attempted to reach the patient regarding the most recent Inpatient/ED visit.  Follow Up Plan: No further outreach attempts will be made at this time. We have been unable to contact the patient.  Gean Maidens BSN RN [facility] [phone]

## 2023-01-14 ENCOUNTER — Other Ambulatory Visit: Payer: Self-pay | Admitting: Physical Medicine and Rehabilitation

## 2023-01-14 ENCOUNTER — Telehealth: Payer: Self-pay | Admitting: Orthopedic Surgery

## 2023-01-14 DIAGNOSIS — S39012A Strain of muscle, fascia and tendon of lower back, initial encounter: Secondary | ICD-10-CM

## 2023-01-14 NOTE — Telephone Encounter (Signed)
Please advise 

## 2023-01-14 NOTE — Telephone Encounter (Signed)
Kenneth Bautista is calling to check on the status of his physical therapy referral.  He states that he saw Aundra Millet and she was going to "sign him up" for P.T.  His call back # is [phone] or [phone].

## 2023-01-17 ENCOUNTER — Telehealth: Payer: Self-pay | Admitting: Physician Assistant

## 2023-01-17 NOTE — Telephone Encounter (Signed)
Pt is calling to report that   [facility]  Is not taking new patients. Can a new referral be placed for another clinic? Please advise CB- [phone]

## 2023-01-18 ENCOUNTER — Encounter: Payer: Self-pay | Admitting: Physician Assistant

## 2023-01-22 ENCOUNTER — Encounter: Payer: Self-pay | Admitting: Physician Assistant

## 2023-01-27 ENCOUNTER — Ambulatory Visit: Payer: No Typology Code available for payment source | Admitting: Family Medicine

## 2023-02-04 ENCOUNTER — Ambulatory Visit: Payer: Medicaid Other | Admitting: Physician Assistant

## 2023-02-07 ENCOUNTER — Ambulatory Visit: Payer: 59 | Attending: Physical Medicine and Rehabilitation

## 2023-03-06 ENCOUNTER — Encounter: Payer: Self-pay | Admitting: Emergency Medicine

## 2023-03-06 ENCOUNTER — Emergency Department
Admission: EM | Admit: 2023-03-06 | Discharge: 2023-03-06 | Disposition: A | Discharge status: Home / Self Care | Payer: Medicaid Other | Attending: Student in an Organized Health Care Education/Training Program | Admitting: Student in an Organized Health Care Education/Training Program

## 2023-03-06 ENCOUNTER — Other Ambulatory Visit: Payer: Self-pay

## 2023-03-06 ENCOUNTER — Emergency Department: Payer: Medicaid Other

## 2023-03-06 DIAGNOSIS — J45909 Unspecified asthma, uncomplicated: Secondary | ICD-10-CM | POA: Insufficient documentation

## 2023-03-06 DIAGNOSIS — M25561 Pain in right knee: Secondary | ICD-10-CM | POA: Insufficient documentation

## 2023-03-06 DIAGNOSIS — M545 Low back pain, unspecified: Secondary | ICD-10-CM | POA: Insufficient documentation

## 2023-03-06 MED ORDER — HYDROCODONE-ACETAMINOPHEN 5-325 MG PO TABS
1.0000 | ORAL_TABLET | Freq: Once | ORAL | Status: AC
  Administered 2023-03-06: 1 via ORAL
  Filled 2023-03-06: qty 1
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
## 2023-03-06 NOTE — Discharge Instructions (Signed)
You can take 650 mg of Tylenol and 600 mg of ibuprofen every 6 hours as needed for pain.  You can use topical pain relievers, ice and heat as needed.
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
## 2023-03-08 ENCOUNTER — Other Ambulatory Visit: Payer: Self-pay | Admitting: Physical Medicine and Rehabilitation

## 2023-03-08 ENCOUNTER — Emergency Department
Admission: EM | Admit: 2023-03-08 | Discharge: 2023-03-08 | Disposition: A | Discharge status: Home / Self Care | Payer: Medicaid Other | Attending: Emergency Medicine | Admitting: Emergency Medicine

## 2023-03-08 ENCOUNTER — Other Ambulatory Visit: Payer: Self-pay

## 2023-03-08 DIAGNOSIS — M25561 Pain in right knee: Secondary | ICD-10-CM | POA: Diagnosis present

## 2023-03-08 MED ORDER — OXYCODONE-ACETAMINOPHEN 5-325 MG PO TABS: 1.0000 | ORAL_TABLET | Freq: Four times a day (QID) | ORAL | 0 refills | Status: DC | PRN
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
## 2023-03-08 NOTE — ED Notes (Signed)
EDP to bedside at this time 

## 2023-03-08 NOTE — ED Provider Notes (Signed)
[facility] Provider Note    Event Date/Time   First MD Initiated Contact with Patient 03/08/23 2144     (approximate)   History   Leg Pain   HPI  Kenneth Bautista is a 37 y.o. male with PMH of arthritis, polysubstance abuse, and prior knee surgeries presents for evaluation of right knee pain.  Patient was evaluated by me 2 days ago.  He states that he has had ongoing knee pain since he was seen in the ED.  He also reports that today his leg locked up and it took a few minutes before he could get it to move again.     Physical Exam   Triage Vital Signs: ED Triage Vitals  Encounter Vitals Group     BP 03/08/23 2003 121/83     Systolic BP Percentile --      Diastolic BP Percentile --      Pulse Rate 03/08/23 2003 68     Resp 03/08/23 2003 16     Temp 03/08/23 2003 98.2 F (36.8 C)     Temp Source 03/08/23 2003 Oral     SpO2 03/08/23 2003 97 %     Weight 03/08/23 2003 140 lb (63.5 kg)     Height 03/08/23 2003 5\' 6"  (1.676 m)     Head Circumference --      Peak Flow --      Pain Score 03/08/23 2009 8     Pain Loc --      Pain Education --      Exclude from Growth Chart --     Most recent vital signs: Vitals:   03/08/23 2003  BP: 121/83  Pulse: 68  Resp: 16  Temp: 98.2 F (36.8 C)  SpO2: 97%    General: Awake, no distress.  CV:  Good peripheral perfusion.  Resp:  Normal effort.  Abd:  No distention.    ED Results / Procedures / Treatments   Labs (all labs ordered are listed, but only abnormal results are displayed) Labs Reviewed - No data to display  PROCEDURES:  Critical Care performed: No  Procedures   MEDICATIONS ORDERED IN ED: Medications - No data to display   IMPRESSION / MDM / ASSESSMENT AND PLAN / ED COURSE  I reviewed the triage vital signs and the nursing notes.                             37 year old male presents for evaluation of right knee pain.  Vital signs stable in triage patient NAD on  exam.  Differential diagnosis includes, but is not limited to, muscle strain, tendon injury, ligament injury, meniscal injury.  Patient's presentation is most consistent with acute, uncomplicated illness.  Since patient had imaging and workup few days ago and with no new injuries I do not feel that repeat workup is necessary.  Patient will be given pain medication and was advised to follow-up with orthopedics.  His history of his knee locking up is concerning for meniscal injury.  Patient was agreeable to plan, voiced understanding, all questions were answered and he was stable at discharge.      FINAL CLINICAL IMPRESSION(S) / ED DIAGNOSES   Final diagnoses:  Acute pain of right knee     Rx / DC Orders   ED Discharge Orders          Ordered    oxyCODONE-acetaminophen (PERCOCET) 5-325 MG tablet  Every 6 hours PRN        03/08/23 2209             Note:  This document was prepared using Dragon voice recognition software and may include unintentional dictation errors.   Cameron Ali, PA-C 03/08/23 2210    Trinna Post, MD 03/09/23 0030

## 2023-03-08 NOTE — Discharge Instructions (Addendum)
You can take the Percocet every 6 hours as needed for severe pain.  You can take 650 mg of Tylenol and 600 mg of ibuprofen every 6 hours as needed for pain.  Please schedule an appointment with the orthopedic doctor whose information is attached.  You will need to call the office.
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
## 2023-03-26 ENCOUNTER — Telehealth: Payer: Self-pay

## 2023-03-26 ENCOUNTER — Telehealth: Payer: Self-pay | Admitting: *Deleted

## 2023-03-26 NOTE — Telephone Encounter (Signed)
Notified Adela Lank [phone]--Fl2 form is complete and ready for pick-up at the front office. Pt is aware regarding complete form.

## 2023-03-26 NOTE — Telephone Encounter (Unsigned)
Copied from CRM [id number]. Topic: General - Other >> Mar 25, 2023  1:48 PM Phill Myron wrote: Ms Adela Lank...  will be faxing a FL2 for emergency housing form and will need it back as soon as possible. She willl come and pick up. Please advise

## 2023-04-02 ENCOUNTER — Ambulatory Visit (INDEPENDENT_AMBULATORY_CARE_PROVIDER_SITE_OTHER): Payer: Medicaid Other | Admitting: Physician Assistant

## 2023-04-02 DIAGNOSIS — Z91199 Patient's noncompliance with other medical treatment and regimen due to unspecified reason: Secondary | ICD-10-CM

## 2023-04-02 NOTE — Progress Notes (Unsigned)
Patient was not seen for appt d/t no call, no show, or late arrival >10 mins past appt time.    Debera Lat PA [facility] [address] [place], [place] [address] [phone] (phone) [phone] (fax) [facility]

## 2023-04-05 ENCOUNTER — Emergency Department: Payer: Medicaid Other

## 2023-04-05 ENCOUNTER — Emergency Department
Admission: EM | Admit: 2023-04-05 | Discharge: 2023-04-05 | Disposition: A | Discharge status: Home / Self Care | Payer: Medicaid Other | Attending: Emergency Medicine | Admitting: Emergency Medicine

## 2023-04-05 ENCOUNTER — Other Ambulatory Visit: Payer: Self-pay

## 2023-04-05 DIAGNOSIS — R531 Weakness: Secondary | ICD-10-CM | POA: Insufficient documentation

## 2023-04-05 DIAGNOSIS — J181 Lobar pneumonia, unspecified organism: Secondary | ICD-10-CM | POA: Insufficient documentation

## 2023-04-05 DIAGNOSIS — G8911 Acute pain due to trauma: Secondary | ICD-10-CM | POA: Diagnosis not present

## 2023-04-05 DIAGNOSIS — M25562 Pain in left knee: Secondary | ICD-10-CM | POA: Diagnosis not present

## 2023-04-05 DIAGNOSIS — J189 Pneumonia, unspecified organism: Secondary | ICD-10-CM

## 2023-04-05 DIAGNOSIS — R0602 Shortness of breath: Secondary | ICD-10-CM

## 2023-04-05 DIAGNOSIS — W19XXXA Unspecified fall, initial encounter: Secondary | ICD-10-CM | POA: Diagnosis not present

## 2023-04-05 LAB — CBC
HCT: 40.8 % (ref 39.0–52.0)
Hemoglobin: 14.2 g/dL (ref 13.0–17.0)
MCH: 31.7 pg (ref 26.0–34.0)
MCHC: 34.8 g/dL (ref 30.0–36.0)
MCV: 91.1 fL (ref 80.0–100.0)
Platelets: 345 10*3/uL (ref 150–400)
RBC: 4.48 MIL/uL (ref 4.22–5.81)
RDW: 12.8 % (ref 11.5–15.5)
WBC: 7.3 10*3/uL (ref 4.0–10.5)
nRBC: 0 % (ref 0.0–0.2)

## 2023-04-05 LAB — BASIC METABOLIC PANEL
Anion gap: 9 (ref 5–15)
BUN: 23 mg/dL — ABNORMAL HIGH (ref 6–20)
CO2: 23 mmol/L (ref 22–32)
Calcium: 9 mg/dL (ref 8.9–10.3)
Chloride: 101 mmol/L (ref 98–111)
Creatinine, Ser: 0.81 mg/dL (ref 0.61–1.24)
GFR, Estimated: >60 mL/min (ref >60)
Glucose, Bld: 118 mg/dL — ABNORMAL HIGH (ref 70–99)
Potassium: 3.3 mmol/L — ABNORMAL LOW (ref 3.5–5.1)
Sodium: 133 mmol/L — ABNORMAL LOW (ref 135–145)

## 2023-04-05 LAB — TROPONIN I (HIGH SENSITIVITY): Troponin I (High Sensitivity): 3 ng/L (ref <18)

## 2023-04-05 MED ORDER — SODIUM CHLORIDE 0.9 % IV SOLN
1.0000 g | Freq: Once | INTRAVENOUS | Status: AC
  Administered 2023-04-05: 1 g via INTRAVENOUS
  Filled 2023-04-05: qty 10

## 2023-04-05 MED ORDER — CEFDINIR 300 MG PO CAPS
300.0000 mg | ORAL_CAPSULE | Freq: Two times a day (BID) | ORAL | 0 refills | Status: AC
  Filled 2023-04-08: qty 10, 5d supply, fill #0

## 2023-04-05 MED ORDER — DEXTROSE 5 % IV SOLN
500.0000 mg | Freq: Once | INTRAVENOUS | Status: AC
  Administered 2023-04-05: 500 mg via INTRAVENOUS
  Filled 2023-04-05: qty 5

## 2023-04-05 MED ORDER — MELOXICAM 15 MG PO TABS
15.0000 mg | ORAL_TABLET | Freq: Every day | ORAL | 2 refills | Status: AC
  Filled 2023-04-08: qty 30, 30d supply, fill #0
  Filled 2023-05-07: qty 30, 30d supply, fill #1
  Filled 2023-08-09 – 2023-08-26 (×2): qty 30, 30d supply, fill #2

## 2023-04-05 MED ORDER — AZITHROMYCIN 250 MG PO TABS
ORAL_TABLET | ORAL | 0 refills | Status: AC
  Filled 2023-04-08: qty 6, 5d supply, fill #0

## 2023-04-05 NOTE — ED Triage Notes (Signed)
Pt here with weakness x2 days. Pt also endorses SOB. Pt states he had a recent fall and injured his left knee and leg, has moderate swelling. Pt walks with a walker as well. Pt states dizziness and gait balance has been an ongoing issue.
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
## 2023-04-05 NOTE — ED Notes (Signed)
Pt Zithromax running, will d/c when finished. EDP Bradler notified.

## 2023-04-06 ENCOUNTER — Other Ambulatory Visit: Payer: Self-pay

## 2023-04-06 ENCOUNTER — Emergency Department: Payer: Medicaid Other

## 2023-04-06 ENCOUNTER — Emergency Department
Admission: EM | Admit: 2023-04-06 | Discharge: 2023-04-06 | Disposition: A | Discharge status: Home / Self Care | Payer: Medicaid Other | Attending: Emergency Medicine | Admitting: Emergency Medicine

## 2023-04-06 ENCOUNTER — Encounter: Payer: Self-pay | Admitting: Emergency Medicine

## 2023-04-06 DIAGNOSIS — R0789 Other chest pain: Secondary | ICD-10-CM

## 2023-04-06 DIAGNOSIS — E876 Hypokalemia: Secondary | ICD-10-CM

## 2023-04-06 DIAGNOSIS — R079 Chest pain, unspecified: Secondary | ICD-10-CM | POA: Diagnosis present

## 2023-04-06 LAB — BASIC METABOLIC PANEL
Anion gap: 9 (ref 5–15)
BUN: 20 mg/dL (ref 6–20)
CO2: 24 mmol/L (ref 22–32)
Calcium: 9.2 mg/dL (ref 8.9–10.3)
Chloride: 103 mmol/L (ref 98–111)
Creatinine, Ser: 0.79 mg/dL (ref 0.61–1.24)
GFR, Estimated: >60 mL/min (ref >60)
Glucose, Bld: 106 mg/dL — ABNORMAL HIGH (ref 70–99)
Potassium: 3 mmol/L — ABNORMAL LOW (ref 3.5–5.1)
Sodium: 136 mmol/L (ref 135–145)

## 2023-04-06 LAB — CBC
HCT: 41.3 % (ref 39.0–52.0)
Hemoglobin: 14.5 g/dL (ref 13.0–17.0)
MCH: 31.7 pg (ref 26.0–34.0)
MCHC: 35.1 g/dL (ref 30.0–36.0)
MCV: 90.2 fL (ref 80.0–100.0)
Platelets: 367 10*3/uL (ref 150–400)
RBC: 4.58 MIL/uL (ref 4.22–5.81)
RDW: 12.6 % (ref 11.5–15.5)
WBC: 7.4 10*3/uL (ref 4.0–10.5)
nRBC: 0 % (ref 0.0–0.2)

## 2023-04-06 LAB — TROPONIN I (HIGH SENSITIVITY)
Troponin I (High Sensitivity): 4 ng/L (ref <18)
Troponin I (High Sensitivity): 3 ng/L (ref <18)

## 2023-04-06 MED ORDER — POTASSIUM CHLORIDE CRYS ER 20 MEQ PO TBCR
40.0000 meq | EXTENDED_RELEASE_TABLET | Freq: Once | ORAL | Status: AC
  Administered 2023-04-06: 40 meq via ORAL
  Filled 2023-04-06: qty 2

## 2023-04-06 MED ORDER — POTASSIUM CHLORIDE CRYS ER 20 MEQ PO TBCR: 20.0000 meq | EXTENDED_RELEASE_TABLET | Freq: Every day | ORAL | 0 refills | Status: DC

## 2023-04-06 NOTE — ED Triage Notes (Signed)
Patient brought in tonight by [facility] from [employer] parking lot. Patient now endorses chest pain. EKG by EMS is NSR. Was just seen and discharged tonight but states his pain is now different.

## 2023-04-06 NOTE — Discharge Instructions (Signed)
Your workup in the Emergency Department today was reassuring.  We did not find any specific abnormalities.  We recommend you drink plenty of fluids, take your regular medications and/or any new ones prescribed today, and follow up with the doctor(s) listed in these documents as recommended.  Return to the Emergency Department if you develop new or worsening symptoms that concern you.  

## 2023-04-06 NOTE — ED Provider Notes (Signed)
[facility] Provider Note    Event Date/Time   First MD Initiated Contact with Patient 04/06/23 [phone]     (approximate)   History   Chest Pain   HPI Kenneth Bautista is a 37 y.o. male who presents for evaluation of chest pain.  He was just discharged from the emergency department a few hours prior.  He was diagnosed with commune acquired pneumonia at the time.  He said that [place] he developed some chest pain and some shortness of breath so he called EMS from a parking lot.  He said he feels better now.  He denies any current shortness of breath or chest pain.  No recent nausea or vomiting.     Physical Exam   Triage Vital Signs: ED Triage Vitals  Encounter Vitals Group     BP 04/06/23 0249 124/75     Systolic BP Percentile --      Diastolic BP Percentile --      Pulse Rate 04/06/23 0249 64     Resp 04/06/23 0249 20     Temp 04/06/23 0249 97.8 F (36.6 C)     Temp Source 04/06/23 0249 Oral     SpO2 04/06/23 0249 96 %     Weight 04/06/23 0126 63.5 kg (139 lb 15.9 oz)     Height 04/06/23 0126 1.676 m (5\' 6" )     Head Circumference --      Peak Flow --      Pain Score --      Pain Loc --      Pain Education --      Exclude from Growth Chart --     Most recent vital signs: Vitals:   04/06/23 0249 04/06/23 0546  BP: 124/75 107/69  Pulse: 64 (!) 57  Resp: 20 18  Temp: 97.8 F (36.6 C) 98 F (36.7 C)  SpO2: 96% 99%    General: Awake, no distress.  CV:  Good peripheral perfusion.  Normal heart sounds, regular rate and rhythm. Resp:  Normal effort. Speaking easily and comfortably, no accessory muscle usage nor intercostal retractions.  Normal lung sounds. Abd:  No distention.    ED Results / Procedures / Treatments   Labs (all labs ordered are listed, but only abnormal results are displayed) Labs Reviewed  BASIC METABOLIC PANEL - Abnormal; Notable for the following  components:      Result Value   Potassium 3.0 (*)    Glucose, Bld 106 (*)    All other components within normal limits  CBC  TROPONIN I (HIGH SENSITIVITY)  TROPONIN I (HIGH SENSITIVITY)     EKG  ED ECG REPORT I, Loleta Rose, the attending physician, personally viewed and interpreted this ECG.  Date: 04/06/2023 EKG Time: 1:45 AM Rate: 66 Rhythm: normal sinus rhythm with sinus arrhythmia QRS Axis: normal Intervals: normal ST/T Wave abnormalities: normal Narrative Interpretation: no evidence of acute ischemia    RADIOLOGY I viewed and interpreted the patient's two-view chest x-ray and I see no evidence of pneumonia nor pneumothorax.  I also read the radiologist's report, which confirmed no acute findings.   PROCEDURES:  Critical Care performed: No  Procedures    IMPRESSION / MDM / ASSESSMENT AND PLAN / ED COURSE  I reviewed the triage vital signs and the nursing notes.  Differential diagnosis includes, but is not limited to, pneumonia, pneumothorax, ACS, musculoskeletal pain, malingering.  Patient's presentation is most consistent with acute presentation with potential threat to life or bodily function.  Labs/studies ordered: BMP, CBC, high-sensitivity troponin, two-view chest x-ray, EKG  Interventions/Medications given:  Medications  potassium chloride SA (KLOR-CON M) CR tablet 40 mEq (40 mEq Oral Given 04/06/23 0544)    (Note:  hospital course my include additional interventions and/or labs/studies not listed above.)   Vital signs are normal and the patient has been sleeping comfortably.  No evidence of cardiac ischemia or any other acute or emergent medical condition.  Potassium level is low at 3.0 for which I ordered 40 mill equivalents of oral repletion and provided a prescription.  I advised that if the patient has community-acquired pneumonia as previously diagnosed, it could certainly lead to some chest discomfort and  shortness of breath.  I encouraged him to continue with the plan for prescription antibiotics and outpatient follow-up.  He said that he understands and agrees.  He was ambulatory without difficulty when he left the emergency department.         FINAL CLINICAL IMPRESSION(S) / ED DIAGNOSES   Final diagnoses:  Atypical chest pain  Hypokalemia     Rx / DC Orders   ED Discharge Orders          Ordered    potassium chloride SA (KLOR-CON M20) 20 MEQ tablet  Daily        04/06/23 0540             Note:  This document was prepared using Dragon voice recognition software and may include unintentional dictation errors.   Loleta Rose, MD 04/06/23 0730

## 2023-04-06 NOTE — ED Notes (Signed)
Patient given discharge instructions including prescriptions x1 and importance of follow up appt as needed with stated understanding. Patient stable and ambulatory with steady even gait on dispo.

## 2023-04-08 ENCOUNTER — Other Ambulatory Visit: Payer: Self-pay
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
## 2023-04-09 ENCOUNTER — Telehealth: Payer: Self-pay

## 2023-04-09 NOTE — Progress Notes (Signed)
   Care Guide Note  04/09/2023 Name: Kenneth Bautista MRN: [id number] DOB: 12-23-1985  Referred by: Debera Lat, PA-C Reason for referral : Care Coordination (Outreach to schedule with Pharm d )   [name] is a 37 y.o. year old male who is a primary care patient of Debera Lat, [place]. Michel Hendon was referred to the pharmacist for assistance related to Anxiety and Depression.    Successful contact was made with the patient to discuss pharmacy services including being ready for the pharmacist to call at least 5 minutes before the scheduled appointment time, to have medication bottles and any blood sugar or blood pressure readings ready for review. The patient agreed to meet with the pharmacist via with the pharmacist via telephone visit on (date/time).  04/10/2023  Penne Lash, RMA Care Guide [facility]  [place], [place] [address] Direct Dial: [phone] [email or web address]

## 2023-04-10 ENCOUNTER — Other Ambulatory Visit: Payer: Medicaid Other | Admitting: Pharmacist

## 2023-04-10 ENCOUNTER — Telehealth: Payer: Self-pay | Admitting: Pharmacist

## 2023-04-10 ENCOUNTER — Other Ambulatory Visit: Payer: Self-pay

## 2023-04-10 MED ORDER — PAROXETINE HCL 40 MG PO TABS
40.0000 mg | ORAL_TABLET | Freq: Every day | ORAL | 0 refills | Status: DC
  Filled 2023-04-10: qty 90, 90d supply, fill #0

## 2023-04-10 MED ORDER — TRAZODONE HCL 100 MG PO TABS
200.0000 mg | ORAL_TABLET | Freq: Every day | ORAL | 1 refills | Status: DC
  Filled 2023-04-10: qty 60, 30d supply, fill #0
  Filled 2023-05-16: qty 60, 30d supply, fill #1

## 2023-04-10 MED ORDER — GABAPENTIN 300 MG PO CAPS
300.0000 mg | ORAL_CAPSULE | Freq: Three times a day (TID) | ORAL | 1 refills | Status: DC
  Filled 2023-04-10: qty 90, 30d supply, fill #0
  Filled 2023-05-16: qty 90, 30d supply, fill #1

## 2023-04-10 MED ORDER — GABAPENTIN 300 MG PO CAPS
300.0000 mg | ORAL_CAPSULE | Freq: Three times a day (TID) | ORAL | 1 refills | Status: DC
  Filled 2023-04-10: qty 90, 30d supply, fill #0

## 2023-04-10 NOTE — Progress Notes (Signed)
04/10/2023 Name: Kenneth Bautista MRN: [id number] DOB: [birth year]  Chief Complaint  Patient presents with   Medication Management    Kenneth Bautista is a 37 y.o. year old male who presented for a telephone visit.   They were referred to the pharmacist by  ED provider  for assistance in managing medication access.    Subjective:  Care Team: Primary Care Provider: Cherlynn Polo ; Next Scheduled Visit: 04/10/23 Clinical Pharmacist: Marlowe Aschoff, PharmD  Medication Access/Adherence  Current Pharmacy:  [facility] [address] [place] [address] Phone: [phone] Fax: [phone]  [facility] [address] ([address]), [place] - [address] [place] ([address]) [place] [address] Phone: [phone] Fax: [phone]  [facility] [address], [place] - [address] [place] [address] Phone: [phone] Fax: [phone]   Patient reports affordability concerns with their medications: Yes  Patient reports access/transportation concerns to their pharmacy: No  Patient reports adherence concerns with their medications:  No     Medication Management:  - Patient reports not getting his disability check at this time, so he does not have the money to afford his medications - Was able to use the Medicaid charge account at [facility] upon ED discharge on 11/3 to obtain his Z-Pack + Cefdinir for pneuomonia, and Meloxicam for knee pain  Objective:  Lab Results  Component Value Date   HGBA1C 5.6 11/28/2021    Lab Results  Component Value Date   CREATININE 0.79 04/06/2023   BUN 20 04/06/2023   NA 136 04/06/2023   K 3.0 (L) 04/06/2023   CL 103 04/06/2023   CO2 24 04/06/2023    Lab Results  Component Value Date   CHOL 213 (H) 11/28/2021   HDL 47 11/28/2021   LDLCALC 143 (H) 11/28/2021   TRIG 127 11/28/2021   CHOLHDL 4.5 11/28/2021     Medications Reviewed Today     Reviewed by Pollie Friar, RPH (Pharmacist) on 04/10/23 at 1427  Med List Status: <None>   Medication Order Taking? Sig Documenting Provider Last Dose Status Informant  albuterol (VENTOLIN HFA) 108 (90 Base) MCG/ACT inhaler [id number] Yes Inhale 2 puffs into the lungs every 6 (six) hours as needed for wheezing or shortness of breath. Charlton Amor, PA-C Taking Active Self, Pharmacy Records, Multiple Informants  aspirin EC 325 MG tablet [id number] Yes Take 1 tablet (325 mg total) by mouth daily. Huel Cote, MD Taking Active Self, Pharmacy Records, Multiple Informants           Med Note Worton, Angelica Chessman Apr 10, 2023  2:27 PM) PRN for pain  azithromycin (ZITHROMAX Z-PAK) 250 MG tablet [id number] Yes Take 2 tablets (500 mg) on  Day 1,  followed by 1 tablet (250 mg) once daily on Days 2 through 5. Merwyn Katos, MD Taking Active   cefdinir (OMNICEF) 300 MG capsule [id number] Yes Take 1 capsule (300 mg total) by mouth 2 (two) times daily for 5 days. Merwyn Katos, MD Taking Active   gabapentin (NEURONTIN) 300 MG capsule [id number] Yes Take 1 capsule (300 mg total) by mouth 3 (three) times daily. Georganna Skeans, MD Taking Active   hydrOXYzine (ATARAX) 25 MG tablet [id number] No Take 1 tablet (25 mg total) by mouth 3 (three) times daily as needed. for anxiety  Patient not taking: Reported on 04/10/2023   Georganna Skeans, MD Not Taking Active  Self, Pharmacy Records, Multiple Informants  ibuprofen (ADVIL) 600 MG tablet [id number] No Take 1 tablet (600 mg total) by mouth every 8 (eight) hours as needed.  Patient not taking: Reported on 04/10/2023   Irean Hong, MD Not Taking Active Pharmacy Records, Multiple Informants, Self  meloxicam [facility]) 15 MG tablet [id number] Yes Take 1 tablet (15 mg total) by mouth daily. Merwyn Katos, MD Taking Active   oxyCODONE-acetaminophen (PERCOCET) 5-325 MG tablet [id number] No Take 1 tablet by mouth every 6 (six) hours as  needed for severe pain.  Patient not taking: Reported on 04/10/2023   Cameron Ali, PA-C Not Taking Active   PARoxetine (PAXIL) 40 MG tablet [id number] Yes Take 1 tablet (40 mg total) by mouth daily. Georganna Skeans, MD Taking Active   potassium chloride SA (KLOR-CON M20) 20 MEQ tablet [id number] No Take 1 tablet (20 mEq total) by mouth daily.  Patient not taking: Reported on 04/10/2023   Loleta Rose, MD Not Taking Active   traZODone (DESYREL) 100 MG tablet [id number] Yes Take 2 tablets (200 mg total) by mouth at bedtime. Georganna Skeans, MD Taking Active               Assessment/Plan:   Medication Management: - Currently strategy sufficient to maintain appropriate adherence to prescribed medication regimen - Patient will be [facility] from [facility] to [facility] for access to [facility] charge account- called to initiate transfer of Gabapentin, Paroxetine, and Trazodone (already have Meloxicam)    **Follow Up Plan:**  - No follow-up indicated as 1 time situation of assisting patient with obtaining medications at this time *For PCP visit tomorrow, I have 3 considerations: - Adding a statin medication based on LDL seen in June 2023 - Re-start Hydroxyzine for anxiety if deemed appropriate - Start Klor-Con daily- chronically low in hospital  Marlowe Aschoff, PharmD [facility] Phone Number: [phone]

## 2023-04-10 NOTE — Progress Notes (Signed)
   04/10/2023  Patient ID: Kenneth Bautista, male   DOB: 24-Apr-1986, 37 y.o.   MRN: [id number]  Tried call   Patient called back. Please see encounter note for 04/10/23.

## 2023-04-11 ENCOUNTER — Ambulatory Visit: Payer: Medicaid Other | Admitting: Physician Assistant

## 2023-04-11 ENCOUNTER — Other Ambulatory Visit: Payer: Self-pay

## 2023-04-11 VITALS — BP 129/70 | HR 63 | Temp 97.7°F | Ht 66.0 in | Wt 154.9 lb

## 2023-04-11 DIAGNOSIS — E876 Hypokalemia: Secondary | ICD-10-CM

## 2023-04-11 DIAGNOSIS — G894 Chronic pain syndrome: Secondary | ICD-10-CM | POA: Diagnosis not present

## 2023-04-11 DIAGNOSIS — F1410 Cocaine abuse, uncomplicated: Secondary | ICD-10-CM

## 2023-04-11 DIAGNOSIS — E7800 Pure hypercholesterolemia, unspecified: Secondary | ICD-10-CM | POA: Diagnosis not present

## 2023-04-11 DIAGNOSIS — R413 Other amnesia: Secondary | ICD-10-CM

## 2023-04-11 DIAGNOSIS — Z5902 Unsheltered homelessness: Secondary | ICD-10-CM

## 2023-04-11 DIAGNOSIS — J4520 Mild intermittent asthma, uncomplicated: Secondary | ICD-10-CM

## 2023-04-11 DIAGNOSIS — F32A Depression, unspecified: Secondary | ICD-10-CM

## 2023-04-11 DIAGNOSIS — K219 Gastro-esophageal reflux disease without esophagitis: Secondary | ICD-10-CM

## 2023-04-11 DIAGNOSIS — F419 Anxiety disorder, unspecified: Secondary | ICD-10-CM | POA: Diagnosis not present

## 2023-04-11 DIAGNOSIS — Z720 Tobacco use: Secondary | ICD-10-CM

## 2023-04-11 DIAGNOSIS — G4700 Insomnia, unspecified: Secondary | ICD-10-CM

## 2023-04-11 DIAGNOSIS — F1910 Other psychoactive substance abuse, uncomplicated: Secondary | ICD-10-CM

## 2023-04-11 DIAGNOSIS — F1010 Alcohol abuse, uncomplicated: Secondary | ICD-10-CM

## 2023-04-11 DIAGNOSIS — Z5900 Homelessness unspecified: Secondary | ICD-10-CM

## 2023-04-11 NOTE — Progress Notes (Unsigned)
Established patient visit  Patient: Kenneth Bautista   DOB: [birth year]   37 y.o. Male  MRN: [id number] Visit Date: 04/11/2023  Today's healthcare provider: Debera Lat, PA-C   Chief Complaint  Patient presents with   Hospitalization Follow-up        Medication Refill   Subjective    Medication Refill   HPI     Hospitalization Follow-up    Additional comments:        Last edited by Barrie Lyme on 04/11/2023  9:57 AM.      *** Discussed the use of AI scribe software for clinical note transcription with the patient, who gave verbal consent to proceed.  History of Present Illness   The patient, with a history of chronic pain, anxiety, and high cholesterol, presents for a medication refill. He reports feeling better than his last hospital visit and is currently on a few medications with a couple of days left. He requests a refill for gabapentin and another unidentified medication for muscle issues. He also reports having anxiety and is currently taking hydroxyzine for it. He has been prescribed meloxicam for knee pain and is also on Zeposia and cefdinir. He also reports memory problems and has been referred to a nephrologist, but he does not recall scheduling an appointment. The patient admits to smoking half a pack of cigarettes a day and occasional marijuana use for relaxation and appetite stimulation. He denies any other recreational drug use.           07/30/2022   10:30 AM 11/28/2021    2:14 PM 10/04/2021   11:47 AM  Depression screen PHQ 2/9  Decreased Interest 0 0   Down, Depressed, Hopeless 1 2   PHQ - 2 Score 1 2   Altered sleeping 3 3   Tired, decreased energy 3 3   Change in appetite 0 2   Feeling bad or failure about yourself  0 3   Trouble concentrating 3 3   Moving slowly or fidgety/restless 2 3   Suicidal thoughts 0 2   PHQ-9 Score 12 21   Difficult doing work/chores  Somewhat difficult      Information is confidential and restricted. Go to  Review Flowsheets to unlock data.      07/30/2022   10:30 AM 10/04/2021   11:50 AM 08/23/2021    1:33 PM 08/13/2021   11:27 AM  GAD 7 : Generalized Anxiety Score  Nervous, Anxious, on Edge 2     Control/stop worrying 1     Worry too much - different things 2     Trouble relaxing 3     Restless 3     Easily annoyed or irritable 3     Afraid - awful might happen 3     Total GAD 7 Score 17     Anxiety Difficulty         Information is confidential and restricted. Go to Review Flowsheets to unlock data.    Medications: Outpatient Medications Prior to Visit  Medication Sig   albuterol (VENTOLIN HFA) 108 (90 Base) MCG/ACT inhaler Inhale 2 puffs into the lungs every 6 (six) hours as needed for wheezing or shortness of breath.   aspirin EC 325 MG tablet Take 1 tablet (325 mg total) by mouth daily.   azithromycin (ZITHROMAX Z-PAK) 250 MG tablet Take 2 tablets (500 mg) on  Day 1,  followed by 1 tablet (250 mg) once daily on Days 2 through 5.   cefdinir (OMNICEF)  300 MG capsule Take 1 capsule (300 mg total) by mouth 2 (two) times daily for 5 days.   gabapentin (NEURONTIN) 300 MG capsule Take 1 capsule (300 mg total) by mouth 3 (three) times daily.   gabapentin (NEURONTIN) 300 MG capsule Take 1 capsule (300 mg total) by mouth 3 (three) times daily.   hydrOXYzine (ATARAX) 25 MG tablet Take 1 tablet (25 mg total) by mouth 3 (three) times daily as needed. for anxiety (Patient not taking: Reported on 04/10/2023)   ibuprofen (ADVIL) 600 MG tablet Take 1 tablet (600 mg total) by mouth every 8 (eight) hours as needed. (Patient not taking: Reported on 04/10/2023)   meloxicam (MOBIC) 15 MG tablet Take 1 tablet (15 mg total) by mouth daily.   oxyCODONE-acetaminophen (PERCOCET) 5-325 MG tablet Take 1 tablet by mouth every 6 (six) hours as needed for severe pain. (Patient not taking: Reported on 04/10/2023)   PARoxetine (PAXIL) 40 MG tablet Take 1 tablet (40 mg total) by mouth daily.   PARoxetine (PAXIL) 40 MG  tablet Take 1 tablet (40 mg total) by mouth daily.   potassium chloride SA (KLOR-CON M20) 20 MEQ tablet Take 1 tablet (20 mEq total) by mouth daily. (Patient not taking: Reported on 04/10/2023)   traZODone (DESYREL) 100 MG tablet Take 2 tablets (200 mg total) by mouth at bedtime.   traZODone (DESYREL) 100 MG tablet Take 2 tablets (200 mg total) by mouth at bedtime.   No facility-administered medications prior to visit.    Review of Systems  All other systems reviewed and are negative.  Except see HPI   {Insert previous labs (optional):23779} {See past labs  Heme  Chem  Endocrine  Serology  Results Review (optional):1}   Objective    BP 129/70 (BP Location: Right Arm, Patient Position: Sitting, Cuff Size: Normal)   Pulse 63   Temp 97.7 F (36.5 C) (Oral)   Ht 5\' 6"  (1.676 m)   Wt 154 lb 14.4 oz (70.3 kg)   SpO2 100%   BMI 25.00 kg/m  {Insert last BP/Wt (optional):23777}{See vitals history (optional):1}   Physical Exam Vitals reviewed.  Constitutional:      General: He is not in acute distress.    Appearance: Normal appearance. He is not diaphoretic.  HENT:     Head: Normocephalic and atraumatic.  Eyes:     General: No scleral icterus.    Conjunctiva/sclera: Conjunctivae normal.  Cardiovascular:     Rate and Rhythm: Normal rate and regular rhythm.     Pulses: Normal pulses.     Heart sounds: Normal heart sounds. No murmur heard. Pulmonary:     Effort: Pulmonary effort is normal. No respiratory distress.     Breath sounds: Normal breath sounds. No wheezing or rhonchi.  Musculoskeletal:     Cervical back: Neck supple.     Right lower leg: No edema.     Left lower leg: No edema.  Lymphadenopathy:     Cervical: No cervical adenopathy.  Skin:    General: Skin is warm and dry.     Findings: No rash.  Neurological:     Mental Status: He is alert and oriented to person, place, and time. Mental status is at baseline.  Psychiatric:        Mood and Affect: Mood  normal.        Behavior: Behavior normal.      No results found for any visits on 04/11/23.  Assessment & Plan    1. Elevated cholesterol *** -  Lipid Profile  2. Unsheltered homelessness *** - AMB Referral [facility]BCI Care Management  3. Homeless ***  4. Memory problem ***  5. Tobacco use ***  6. Chronic pain syndrome ***  7. Mild intermittent asthma without complication ***  8. Insomnia, unspecified type ***  9. Anxiety and depression ***  10. Gastroesophageal reflux disease without esophagitis ***  11. Alcohol abuse ***  12. Cocaine abuse (HCC) ***  13. Hypokalemia ***  14. Polysubstance abuse (HCC) ***     Anxiety Ongoing symptoms. -Prescribe Hydroxyzine.  Hypokalemia Discussed need for potassium supplementation. -Prescribe potassium supplement.  Hyperlipidemia High cholesterol levels reported from hospital blood work. -Order lipid panel today to confirm. -If confirmed, prescribe cholesterol medication on Monday.  Chronic Pain Patient on Gabapentin and Meloxicam for knee pain. -Advise continuation of Meloxicam. -Suggest addition of Tylenol for pain management.  Social Determinants of Health Patient reported housing instability. -Refer to social work for assistance.  Follow-up Multiple chronic conditions. -Schedule appointment in one month.         Return in about 4 weeks (around 05/09/2023) for chronic disease f/u.     The patient was advised to call back or seek an in-person evaluation if the symptoms worsen or if the condition fails to improve as anticipated.  I discussed the assessment and treatment plan with the patient. The patient was provided an opportunity to ask questions and all were answered. The patient agreed with the plan and demonstrated an understanding of the instructions.  I, Debera Lat, PA-C have reviewed all documentation for this visit. The documentation on  @CurDate @  for the exam, diagnosis, procedures, and  orders are all accurate and complete.  Debera Lat, [name], MMS [facility] [phone] (phone) [phone] (fax)  [facility]

## 2023-04-12 LAB — LIPID PANEL
Chol/HDL Ratio: 3.4 ratio (ref 0.0–5.0)
Cholesterol, Total: 193 mg/dL (ref 100–199)
HDL: 57 mg/dL (ref >39)
LDL Chol Calc (NIH): 123 mg/dL — ABNORMAL HIGH (ref 0–99)
Triglycerides: 68 mg/dL (ref 0–149)
VLDL Cholesterol Cal: 13 mg/dL (ref 5–40)

## 2023-04-12 MED ORDER — POTASSIUM CHLORIDE CRYS ER 20 MEQ PO TBCR
20.0000 meq | EXTENDED_RELEASE_TABLET | Freq: Every day | ORAL | 0 refills | Status: DC
  Filled 2023-04-12: qty 30, 30d supply, fill #0

## 2023-04-12 MED ORDER — HYDROXYZINE HCL 25 MG PO TABS
ORAL_TABLET | ORAL | 0 refills | Status: DC
  Filled 2023-04-12: qty 30, 30d supply, fill #0

## 2023-04-13 ENCOUNTER — Other Ambulatory Visit: Payer: Self-pay

## 2023-04-14 ENCOUNTER — Other Ambulatory Visit: Payer: Self-pay | Admitting: Licensed Clinical Social Worker

## 2023-04-14 ENCOUNTER — Other Ambulatory Visit: Payer: Self-pay
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
## 2023-04-30 ENCOUNTER — Other Ambulatory Visit: Payer: Self-pay
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
## 2023-05-05 ENCOUNTER — Other Ambulatory Visit: Payer: Self-pay | Admitting: Licensed Clinical Social Worker
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
## 2023-05-07 ENCOUNTER — Other Ambulatory Visit: Payer: Self-pay

## 2023-05-07 ENCOUNTER — Other Ambulatory Visit: Payer: Self-pay | Admitting: Physician Assistant

## 2023-05-07 DIAGNOSIS — E876 Hypokalemia: Secondary | ICD-10-CM

## 2023-05-09 ENCOUNTER — Other Ambulatory Visit: Payer: Self-pay | Admitting: Physician Assistant

## 2023-05-09 ENCOUNTER — Other Ambulatory Visit: Payer: Self-pay

## 2023-05-09 DIAGNOSIS — E876 Hypokalemia: Secondary | ICD-10-CM

## 2023-05-09 MED FILL — Potassium Chloride Microencapsulated Crys ER Tab 20 mEq: ORAL | 90 days supply | Qty: 90 | Fill #0 | Status: CN

## 2023-05-09 NOTE — Telephone Encounter (Signed)
Requested Prescriptions  Pending Prescriptions Disp Refills   potassium chloride SA (KLOR-CON M) 20 MEQ tablet [Pharmacy Med Name: potassium chloride SA (KLOR-CON M20) 20 MEQ tablet] 30 tablet 0    Sig: Take 1 tablet (20 mEq total) by mouth daily.     Endocrinology:  Minerals - Potassium Supplementation Failed - 05/09/2023 11:04 AM      Failed - K in normal range and within 360 days    Potassium  Date Value Ref Range Status  04/06/2023 3.0 (L) 3.5 - 5.1 mmol/L Final  01/14/2013 3.6 3.5 - 5.1 mmol/L Final         Passed - Cr in normal range and within 360 days    Creatinine  Date Value Ref Range Status  01/14/2013 0.80 0.60 - 1.30 mg/dL Final   Creatinine, Ser  Date Value Ref Range Status  04/06/2023 0.79 0.61 - 1.24 mg/dL Final         Passed - Valid encounter within last 12 months    Recent Outpatient Visits           4 weeks ago Anxiety and depression   [place] [facility] [place], [place], PA-C   1 month ago No-show for appointment   [facility] [place], [place], PA-C   4 months ago Insomnia, unspecified type   [facility] [place], [place], PA-C   6 months ago Anxiety and depression   [place] [facility], MD   9 months ago Anxiety and depression   [place] [facility], MD       Future Appointments             In 1 week Debera Lat, PA-C [facility], [facility]

## 2023-05-09 NOTE — Telephone Encounter (Signed)
Requested medication (s) are due for refill today: na  Requested medication (s) are on the active medication list: yes  Last refill:  04/12/23 #30 0 refills  Future visit scheduled: yes in 1 week  Notes to clinic:  no refills remain. Do you want to refill Rx? Last lab 04/06/23 low potassium     Requested Prescriptions  Pending Prescriptions Disp Refills   potassium chloride SA (KLOR-CON M20) 20 MEQ tablet 30 tablet 0    Sig: Take 1 tablet (20 mEq total) by mouth daily.     Endocrinology:  Minerals - Potassium Supplementation Failed - 05/07/2023 12:14 PM      Failed - K in normal range and within 360 days    Potassium  Date Value Ref Range Status  04/06/2023 3.0 (L) 3.5 - 5.1 mmol/L Final  01/14/2013 3.6 3.5 - 5.1 mmol/L Final         Passed - Cr in normal range and within 360 days    Creatinine  Date Value Ref Range Status  01/14/2013 0.80 0.60 - 1.30 mg/dL Final   Creatinine, Ser  Date Value Ref Range Status  04/06/2023 0.79 0.61 - 1.24 mg/dL Final         Passed - Valid encounter within last 12 months    Recent Outpatient Visits           4 weeks ago Anxiety and depression   [place] [facility] [place], [place], PA-C   1 month ago No-show for appointment   [facility] [place], [place], PA-C   4 months ago Insomnia, unspecified type   [facility] [place], [place], PA-C   6 months ago Anxiety and depression   [place] [facility], MD   9 months ago Anxiety and depression   [place] [facility], MD       Future Appointments             In 1 week Debera Lat, PA-C [facility], [facility]

## 2023-05-12 ENCOUNTER — Ambulatory Visit: Payer: Medicaid Other | Admitting: Physician Assistant

## 2023-05-12 ENCOUNTER — Other Ambulatory Visit: Payer: Self-pay

## 2023-05-16 ENCOUNTER — Ambulatory Visit: Payer: Medicaid Other | Admitting: Physician Assistant

## 2023-05-16 ENCOUNTER — Other Ambulatory Visit: Payer: Self-pay

## 2023-05-16 VITALS — BP 121/73 | HR 68 | Temp 98.7°F | Ht 66.0 in | Wt 165.0 lb

## 2023-05-16 DIAGNOSIS — R413 Other amnesia: Secondary | ICD-10-CM

## 2023-05-16 DIAGNOSIS — Z720 Tobacco use: Secondary | ICD-10-CM

## 2023-05-16 DIAGNOSIS — F1910 Other psychoactive substance abuse, uncomplicated: Secondary | ICD-10-CM

## 2023-05-16 DIAGNOSIS — Z87828 Personal history of other (healed) physical injury and trauma: Secondary | ICD-10-CM

## 2023-05-16 DIAGNOSIS — F32A Depression, unspecified: Secondary | ICD-10-CM

## 2023-05-16 DIAGNOSIS — G894 Chronic pain syndrome: Secondary | ICD-10-CM

## 2023-05-16 DIAGNOSIS — E876 Hypokalemia: Secondary | ICD-10-CM

## 2023-05-16 DIAGNOSIS — F419 Anxiety disorder, unspecified: Secondary | ICD-10-CM

## 2023-05-16 DIAGNOSIS — E7800 Pure hypercholesterolemia, unspecified: Secondary | ICD-10-CM

## 2023-05-16 MED FILL — Potassium Chloride Microencapsulated Crys ER Tab 20 mEq: ORAL | 90 days supply | Qty: 90 | Fill #0 | Status: AC
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
## 2023-05-17 LAB — BASIC METABOLIC PANEL
BUN/Creatinine Ratio: 21 — ABNORMAL HIGH (ref 9–20)
BUN: 17 mg/dL (ref 6–20)
CO2: 23 mmol/L (ref 20–29)
Calcium: 9.3 mg/dL (ref 8.7–10.2)
Chloride: 98 mmol/L (ref 96–106)
Creatinine, Ser: 0.81 mg/dL (ref 0.76–1.27)
Glucose: 72 mg/dL (ref 70–99)
Potassium: 4.4 mmol/L (ref 3.5–5.2)
Sodium: 136 mmol/L (ref 134–144)
eGFR: 116 mL/min/{1.73_m2} (ref >59)

## 2023-05-17 LAB — LIPID PANEL
Chol/HDL Ratio: 3.2 {ratio} (ref 0.0–5.0)
Cholesterol, Total: 249 mg/dL — ABNORMAL HIGH (ref 100–199)
HDL: 79 mg/dL (ref >39)
LDL Chol Calc (NIH): 142 mg/dL — ABNORMAL HIGH (ref 0–99)
Triglycerides: 160 mg/dL — ABNORMAL HIGH (ref 0–149)
VLDL Cholesterol Cal: 28 mg/dL (ref 5–40)

## 2023-05-18 ENCOUNTER — Encounter: Payer: Self-pay | Admitting: Physician Assistant
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
## 2023-05-20 NOTE — Progress Notes (Signed)
Atorvastatin 20 mg # 90 refill 1 if pt agrees

## 2023-05-21 ENCOUNTER — Encounter: Payer: Medicaid Other | Admitting: Physical Medicine and Rehabilitation

## 2023-05-29 ENCOUNTER — Other Ambulatory Visit: Payer: Self-pay

## 2023-05-29 ENCOUNTER — Encounter (HOSPITAL_COMMUNITY): Payer: Self-pay

## 2023-05-29 ENCOUNTER — Emergency Department (HOSPITAL_COMMUNITY)
Admission: EM | Admit: 2023-05-29 | Discharge: 2023-05-29 | Discharge status: Against Medical Advice | Payer: Medicaid Other | Attending: Emergency Medicine | Admitting: Emergency Medicine

## 2023-05-29 DIAGNOSIS — M79604 Pain in right leg: Secondary | ICD-10-CM | POA: Insufficient documentation

## 2023-05-29 DIAGNOSIS — R200 Anesthesia of skin: Secondary | ICD-10-CM | POA: Insufficient documentation

## 2023-05-29 DIAGNOSIS — Z5321 Procedure and treatment not carried out due to patient leaving prior to being seen by health care provider: Secondary | ICD-10-CM | POA: Insufficient documentation

## 2023-05-29 NOTE — ED Triage Notes (Signed)
Pt reporting pain and numbness in the right leg present for apprx 2 wks. Pt has hx of surgery in that leg.

## 2023-06-02 ENCOUNTER — Other Ambulatory Visit: Payer: Self-pay
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
## 2023-06-02 NOTE — Patient Instructions (Signed)
Visit Information  Mr. Oftedahl was given information about Medicaid Managed Care team care coordination services as a part of their [employer] Medicaid benefit. [name] verbally consentedto engagement with the [facility] Managed Care team.   If you are experiencing a medical emergency, please call 911 or report to your local emergency department or urgent care.   If you have a non-emergency medical problem during routine business hours, please contact your provider's office and ask to speak with a nurse.   For questions related to your [facility] [facility] health plan, please call: [phone]  OR visit the member homepage at: [email or web address]  If you would like to schedule transportation through your [facility] plan, please call the following number at least 2 days in advance of your appointment: [phone]  If you are experiencing a behavioral health crisis, call the [employer] [facility] Crisis Line at [phone]). The line is available 24 hours a day, seven days a week.  If you would like help to quit smoking, call 1-800-QUIT-NOW (613-138-9277) OR Espaol: 1-855-Djelo-Ya (6-644-034-7425) o para ms informacin haga clic aqu or Text READY to 956-387 to register via text   Mr. Smethurst - following are the goals we discussed in your visit today:   Goals Addressed   None      Social Worker will follow up in 60 days  Gus Puma, Kenard Gower, [place] [facility]  Managed Medicaid Social Worker [phone]   Following is a copy of your plan of care:  There are no care plans that you recently modified to display for this patient.

## 2023-06-03 ENCOUNTER — Other Ambulatory Visit: Payer: Self-pay

## 2023-06-16 ENCOUNTER — Other Ambulatory Visit: Payer: Self-pay

## 2023-06-16 ENCOUNTER — Encounter: Payer: MEDICAID | Attending: Physical Medicine and Rehabilitation | Admitting: Physical Medicine and Rehabilitation

## 2023-06-16 VITALS — BP 113/75 | HR 75 | Ht 66.0 in | Wt 157.0 lb

## 2023-06-16 DIAGNOSIS — G8929 Other chronic pain: Secondary | ICD-10-CM | POA: Diagnosis present

## 2023-06-16 DIAGNOSIS — M25561 Pain in right knee: Secondary | ICD-10-CM | POA: Insufficient documentation

## 2023-06-16 DIAGNOSIS — Z87820 Personal history of traumatic brain injury: Secondary | ICD-10-CM | POA: Insufficient documentation

## 2023-06-16 DIAGNOSIS — F1910 Other psychoactive substance abuse, uncomplicated: Secondary | ICD-10-CM | POA: Insufficient documentation

## 2023-06-16 DIAGNOSIS — G894 Chronic pain syndrome: Secondary | ICD-10-CM | POA: Diagnosis not present

## 2023-06-16 DIAGNOSIS — M5441 Lumbago with sciatica, right side: Secondary | ICD-10-CM | POA: Insufficient documentation

## 2023-06-16 MED ORDER — GABAPENTIN 300 MG PO CAPS: ORAL_CAPSULE | ORAL | 1 refills | Status: DC

## 2023-06-16 MED ORDER — GABAPENTIN 300 MG PO CAPS
ORAL_CAPSULE | ORAL | 1 refills | Status: DC
  Filled 2023-06-16: qty 120, 30d supply, fill #0

## 2023-06-16 NOTE — Patient Instructions (Addendum)
 I am increasing your gabapentin  to 300 mg in the morning, 300 mg in the afternoon, and 600 mg at nighttime.   I am sending you back to PT at [facility] for your right knee and low back. If you cannot afford copay, please look into a pro bono clinic as below Local [facility] PT clinics:  1) [facility] [email or web address] Clinic #: [phone]   2) [place] [facility] clinic  [email or web address] Clinic #: [phone]  3) [facility] [email or web address] Clinic #: [phone]   Obtain an over the counter wrapping knee brace at [facility] for further support; I would defer prescription braces to orthopedics, who you are seeing at the end of the month.  Set up a flouroscopic right knee injection with Dr. Carilyn; if you can get this done faster with Orthopedics you may schedule it with them instead.   Follow up with me in 1-2 months. If no improvement at that time and if you can abstian from alcohol/recreational substances completely, we will sign a pain contract and consider escalating your pain regimen as indicated.
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
## 2023-06-19 ENCOUNTER — Emergency Department (HOSPITAL_COMMUNITY): Payer: MEDICAID

## 2023-06-19 ENCOUNTER — Emergency Department (HOSPITAL_COMMUNITY)
Admission: EM | Admit: 2023-06-19 | Discharge: 2023-06-19 | Disposition: A | Discharge status: Home / Self Care | Payer: MEDICAID | Attending: Emergency Medicine | Admitting: Emergency Medicine

## 2023-06-19 ENCOUNTER — Encounter (HOSPITAL_COMMUNITY): Payer: Self-pay

## 2023-06-19 DIAGNOSIS — M25561 Pain in right knee: Secondary | ICD-10-CM | POA: Insufficient documentation

## 2023-06-19 DIAGNOSIS — Z7982 Long term (current) use of aspirin: Secondary | ICD-10-CM | POA: Insufficient documentation

## 2023-06-19 DIAGNOSIS — G8929 Other chronic pain: Secondary | ICD-10-CM | POA: Diagnosis not present

## 2023-06-19 MED ORDER — LIDOCAINE 5 % EX PTCH
1.0000 | MEDICATED_PATCH | CUTANEOUS | Status: DC
  Administered 2023-06-19: 1 via TRANSDERMAL
  Filled 2023-06-19: qty 1

## 2023-06-19 NOTE — Discharge Instructions (Signed)
Your x-rays did not show any broken bones or other injuries.  Please follow-up with your orthopedic doctor and pain clinic.  Pick up your gabapentin for pain.  Return to the emergency department if you experience any concerning symptoms.
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
## 2023-06-19 NOTE — ED Triage Notes (Signed)
Pt to ED c/o right leg pain x 2 years, ambulatory in triage with cane. Reports follow up apt with ortho in 2 weeks.

## 2023-06-19 NOTE — ED Provider Triage Note (Signed)
Emergency Medicine Provider Triage Evaluation Note  Kenneth Bautista , a 38 y.o. male  was evaluated in triage.  Pt complains of leg pain.  Patient complains of sharp stabbing pain shooting down the front of his right thigh and into the front of his right leg.  This been going on for 2 months.  First time being assessed.  Denies back pain saddle anesthesia weakness bowel or bladder incontinence..  Review of Systems  Positive: Leg pain Negative: Fever  Physical Exam  BP 122/64 (BP Location: Right Arm)   Pulse 61   Temp (!) 97.5 F (36.4 C) (Oral)   Resp 18   Ht 5\' 6"  (1.676 m)   Wt 73.9 kg   SpO2 99%   BMI 26.31 kg/m  Gen:   Awake, no distress   Resp:  Normal effort  MSK:   Moves extremities without difficulty  Other:    Medical Decision Making  Medically screening exam initiated at 2:33 PM.  Appropriate orders placed.  Jatavian Konopka was informed that the remainder of the evaluation will be completed by another provider, this initial triage assessment does not replace that evaluation, and the importance of remaining in the ED until their evaluation is complete.     Arthor Captain, PA-C 06/19/23 1434

## 2023-06-19 NOTE — ED Notes (Signed)
Patient transported to X-ray 

## 2023-06-27 ENCOUNTER — Ambulatory Visit: Payer: Medicaid Other | Admitting: Physician Assistant

## 2023-06-29 ENCOUNTER — Other Ambulatory Visit: Payer: Self-pay

## 2023-06-29 ENCOUNTER — Emergency Department (HOSPITAL_COMMUNITY): Payer: MEDICAID

## 2023-06-29 ENCOUNTER — Encounter (HOSPITAL_COMMUNITY): Payer: Self-pay | Admitting: *Deleted

## 2023-06-29 ENCOUNTER — Emergency Department (HOSPITAL_COMMUNITY)
Admission: EM | Admit: 2023-06-29 | Discharge: 2023-06-30 | Discharge status: Against Medical Advice | Payer: MEDICAID | Attending: Emergency Medicine | Admitting: Emergency Medicine

## 2023-06-29 DIAGNOSIS — Z5321 Procedure and treatment not carried out due to patient leaving prior to being seen by health care provider: Secondary | ICD-10-CM | POA: Diagnosis not present

## 2023-06-29 DIAGNOSIS — M545 Low back pain, unspecified: Secondary | ICD-10-CM | POA: Insufficient documentation
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
## 2023-06-29 NOTE — ED Notes (Addendum)
Pt called for vitals x3, no response.

## 2023-06-30 NOTE — ED Notes (Signed)
Pt called for vitals multiple times, no response

## 2023-07-15 ENCOUNTER — Other Ambulatory Visit: Payer: Self-pay

## 2023-07-15 MED ORDER — GABAPENTIN 300 MG PO CAPS
ORAL_CAPSULE | ORAL | 1 refills | Status: DC
  Filled 2023-07-15: qty 120, 30d supply, fill #0
  Filled 2023-08-09 – 2023-08-26 (×2): qty 120, 30d supply, fill #1
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
## 2023-07-16 ENCOUNTER — Ambulatory Visit: Payer: Medicaid Other | Admitting: Family Medicine

## 2023-07-16 ENCOUNTER — Ambulatory Visit (HOSPITAL_BASED_OUTPATIENT_CLINIC_OR_DEPARTMENT_OTHER): Payer: Self-pay | Attending: Physical Therapy | Admitting: Physical Therapy
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
## 2023-07-18 ENCOUNTER — Encounter: Payer: Self-pay | Admitting: Physical Medicine & Rehabilitation

## 2023-07-18 ENCOUNTER — Encounter: Payer: Medicare HMO | Attending: Physical Medicine & Rehabilitation | Admitting: Physical Medicine & Rehabilitation

## 2023-07-18 VITALS — BP 107/71 | HR 87 | Temp 97.6°F | Ht 66.0 in | Wt 154.0 lb

## 2023-07-18 DIAGNOSIS — M25561 Pain in right knee: Secondary | ICD-10-CM | POA: Diagnosis not present

## 2023-07-18 DIAGNOSIS — G8929 Other chronic pain: Secondary | ICD-10-CM | POA: Insufficient documentation

## 2023-07-18 MED ORDER — LIDOCAINE HCL 1 % IJ SOLN
5.0000 mL | Freq: Once | INTRAMUSCULAR | Status: AC
  Administered 2023-07-18: 5 mL

## 2023-07-18 MED ORDER — BETAMETHASONE SOD PHOS & ACET 6 (3-3) MG/ML IJ SUSP
6.0000 mg | Freq: Once | INTRAMUSCULAR | Status: AC
  Administered 2023-07-18: 6 mg via INTRA_ARTICULAR

## 2023-07-18 MED ORDER — BUPIVACAINE HCL (PF) 0.5 % IJ SOLN
5.0000 mL | Freq: Once | INTRAMUSCULAR | Status: AC
  Administered 2023-07-18: 5 mL

## 2023-07-18 MED ORDER — IOHEXOL 180 MG/ML  SOLN
3.0000 mL | Freq: Once | INTRAMUSCULAR | Status: AC
  Administered 2023-07-18: 3 mL

## 2023-07-18 NOTE — Progress Notes (Signed)
   Subjective:    Patient ID: Kenneth Bautista, male    DOB: [birth year], 38 y.o.   MRN: [id number]  HPI   .cpr PROCEDURE RECORD [facility]   Name: Kenneth Bautista DOB:1986-05-13 MRN: [id number]  Date:07/18/2023  Physician: Claudette Laws, MD    Nurse/CMA: Charise Carwin  Allergies: No Known Allergies  Consent Signed: Yes.    Is patient diabetic? No.  CBG today? N/A  Pregnant: No. LMP: No LMP for male patient. (age 69-55)  Anticoagulants: no Anti-inflammatory: no Antibiotics: no  Procedure: Right Genicular Knee injection   Position: Supine Start Time: 11:23 am     End Time: 11:26 AM  Fluoro Time: 27  RN/CMA Telicia Hodgkiss MA Lillyonna Armstead MA    Time 11:06 AM 11:32 AM    BP 107/71 117/73    Pulse 88 76    Respirations 16 16    O2 Sat 99 99    S/S 6 6    Pain Level 8/10 6/10     D/C home with Self, patient A & O X 3, D/C instructions reviewed, and sits independently.        Review of Systems     Objective:   Physical Exam        Assessment & Plan:

## 2023-07-18 NOTE — Progress Notes (Signed)
Knee injection With fluoroscopic guidance)  Indication:RIght Knee pain not relieved by medication management and other conservative care.hx ACL/PCL reconstruction   Informed consent was obtained after describing risks and benefits of the procedure with the patient, this includes bleeding, bruising, infection and medication side effects. The patient wishes to proceed and has given written consent. The patient was placed in a recumbent position. The medial aspect of the knee was marked and prepped with Betadine and alcohol. It was then entered with a 27-gauge 1-1/2 inch needle and 1 mL of 1% lidocaine was injected into the skin and subcutaneous tissue. Then another 25g 1.5"needle was inserted into the knee joint under fluoroscopic guidance. Omnipaque 180 injected x 2ml with intra articular spread.  Images saved.After negative draw back for blood, a solution containing one ML of 6mg  per mL betamethasone and 4 mL of .5 % marcaine were injected. The patient tolerated the procedure well. Post procedure instructions were given.

## 2023-07-28 ENCOUNTER — Encounter: Payer: MEDICAID | Admitting: Physical Medicine and Rehabilitation
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
## 2023-08-01 ENCOUNTER — Other Ambulatory Visit: Payer: Self-pay

## 2023-08-01 NOTE — Patient Outreach (Signed)
 Unsuccessful telephone outreach with today, A women picked up the line stating BSW had the wrong number and she did not know anyone by that name.   Abelino Derrick, MHA [facility]  Managed [facility] Social Worker [phone]

## 2023-08-01 NOTE — Patient Instructions (Signed)
  Medicaid Managed Care   Unsuccessful Outreach Note  08/01/2023 Name: Denys Salinger MRN: [id number] DOB: 05-Sep-1985  Referred by: Debera Lat, PA-C Reason for referral : High Risk Managed Medicaid (MM social work unsuccessful telephone outreach )   An unsuccessful telephone outreach was attempted today. The patient was referred to the case management team for assistance with care management and care coordination.   Follow Up Plan: The patient has been provided with contact information for the care management team and has been advised to call with any health related questions or concerns.   Abelino Derrick, MHA [facility]  Managed [facility] Social Worker [phone]

## 2023-08-09 ENCOUNTER — Other Ambulatory Visit: Payer: Self-pay | Admitting: Family Medicine

## 2023-08-09 ENCOUNTER — Other Ambulatory Visit: Payer: Self-pay | Admitting: Physician Assistant

## 2023-08-09 DIAGNOSIS — F32A Depression, unspecified: Secondary | ICD-10-CM

## 2023-08-09 MED FILL — Potassium Chloride Microencapsulated Crys ER Tab 20 mEq: ORAL | 90 days supply | Qty: 90 | Fill #1 | Status: CN

## 2023-08-10 ENCOUNTER — Other Ambulatory Visit: Payer: Self-pay

## 2023-08-11 ENCOUNTER — Other Ambulatory Visit: Payer: Self-pay | Admitting: Physician Assistant

## 2023-08-11 ENCOUNTER — Encounter: Payer: Medicare HMO | Attending: Physical Medicine and Rehabilitation | Admitting: Physical Medicine and Rehabilitation

## 2023-08-11 ENCOUNTER — Other Ambulatory Visit: Payer: Self-pay

## 2023-08-11 DIAGNOSIS — F32A Depression, unspecified: Secondary | ICD-10-CM

## 2023-08-11 DIAGNOSIS — G8929 Other chronic pain: Secondary | ICD-10-CM | POA: Insufficient documentation

## 2023-08-11 DIAGNOSIS — M25561 Pain in right knee: Secondary | ICD-10-CM | POA: Insufficient documentation

## 2023-08-12 ENCOUNTER — Other Ambulatory Visit: Payer: Self-pay

## 2023-08-12 MED FILL — Hydroxyzine HCl Tab 25 MG: ORAL | 30 days supply | Qty: 30 | Fill #0 | Status: CN

## 2023-08-12 NOTE — Telephone Encounter (Signed)
 Requested Prescriptions  Pending Prescriptions Disp Refills   hydrOXYzine (ATARAX) 25 MG tablet 30 tablet 0    Sig: Take 1 tab by mouth at bedtime daily     Ear, Nose, and Throat:  Antihistamines 2 Passed - 08/12/2023  2:28 PM      Passed - Cr in normal range and within 360 days    Creatinine  Date Value Ref Range Status  01/14/2013 0.80 0.60 - 1.30 mg/dL Final   Creatinine, Ser  Date Value Ref Range Status  05/16/2023 0.81 0.76 - 1.27 mg/dL Final         Passed - Valid encounter within last 12 months    Recent Outpatient Visits           2 months ago Chronic pain syndrome   [facility] [place], [place], PA-C   4 months ago Anxiety and depression   [facility] [place], [place], PA-C   4 months ago No-show for appointment   [facility] [place], [employer], PA-C   7 months ago Insomnia, unspecified type   [facility] [place], [place], PA-C   9 months ago Anxiety and depression   [facility], MD
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
## 2023-08-21 ENCOUNTER — Other Ambulatory Visit: Payer: Self-pay

## 2023-08-26 ENCOUNTER — Other Ambulatory Visit: Payer: Self-pay

## 2023-08-26 MED FILL — Potassium Chloride Microencapsulated Crys ER Tab 20 mEq: ORAL | 90 days supply | Qty: 90 | Fill #1 | Status: AC

## 2023-08-26 MED FILL — Hydroxyzine HCl Tab 25 MG: ORAL | 30 days supply | Qty: 30 | Fill #0 | Status: AC
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
## 2023-08-27 ENCOUNTER — Ambulatory Visit (INDEPENDENT_AMBULATORY_CARE_PROVIDER_SITE_OTHER): Payer: Medicaid Other | Admitting: Neurology

## 2023-08-27 ENCOUNTER — Other Ambulatory Visit: Payer: Self-pay

## 2023-08-27 ENCOUNTER — Encounter: Payer: Self-pay | Admitting: Neurology

## 2023-08-27 VITALS — BP 121/70 | HR 71 | Ht 66.0 in | Wt 145.5 lb

## 2023-08-27 DIAGNOSIS — S062X9A Diffuse traumatic brain injury with loss of consciousness of unspecified duration, initial encounter: Secondary | ICD-10-CM

## 2023-08-27 DIAGNOSIS — R404 Transient alteration of awareness: Secondary | ICD-10-CM

## 2023-08-27 DIAGNOSIS — R413 Other amnesia: Secondary | ICD-10-CM | POA: Diagnosis not present

## 2023-08-27 DIAGNOSIS — S069X9A Unspecified intracranial injury with loss of consciousness of unspecified duration, initial encounter: Secondary | ICD-10-CM

## 2023-08-27 MED ORDER — DONEPEZIL HCL 5 MG PO TABS
5.0000 mg | ORAL_TABLET | Freq: Every day | ORAL | 5 refills | Status: AC
  Filled 2023-08-27 – 2023-12-05 (×4): qty 30, 30d supply, fill #0
  Filled 2024-01-03: qty 30, 30d supply, fill #1
  Filled 2024-02-01: qty 30, 30d supply, fill #2
  Filled 2024-04-09: qty 30, 30d supply, fill #3
  Filled 2024-05-08: qty 30, 30d supply, fill #4
  Filled 2024-06-14: qty 30, 30d supply, fill #5
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
## 2023-08-28 ENCOUNTER — Other Ambulatory Visit (HOSPITAL_COMMUNITY): Payer: Self-pay

## 2023-08-28 ENCOUNTER — Other Ambulatory Visit (HOSPITAL_BASED_OUTPATIENT_CLINIC_OR_DEPARTMENT_OTHER): Payer: Self-pay

## 2023-08-28 ENCOUNTER — Other Ambulatory Visit: Payer: Self-pay

## 2023-08-29 ENCOUNTER — Other Ambulatory Visit (HOSPITAL_BASED_OUTPATIENT_CLINIC_OR_DEPARTMENT_OTHER): Payer: Self-pay

## 2023-09-03 ENCOUNTER — Other Ambulatory Visit: Admitting: *Deleted

## 2023-09-03 ENCOUNTER — Encounter: Payer: Self-pay | Admitting: *Deleted
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
## 2023-09-25 ENCOUNTER — Encounter: Payer: Self-pay | Admitting: Physician Assistant

## 2023-09-25 ENCOUNTER — Ambulatory Visit (INDEPENDENT_AMBULATORY_CARE_PROVIDER_SITE_OTHER): Admitting: Physician Assistant

## 2023-09-25 VITALS — BP 109/59 | HR 62 | Resp 16 | Ht 66.0 in | Wt 154.9 lb

## 2023-09-25 DIAGNOSIS — E7800 Pure hypercholesterolemia, unspecified: Secondary | ICD-10-CM | POA: Diagnosis not present

## 2023-09-25 DIAGNOSIS — R2232 Localized swelling, mass and lump, left upper limb: Secondary | ICD-10-CM | POA: Diagnosis not present

## 2023-09-25 DIAGNOSIS — E876 Hypokalemia: Secondary | ICD-10-CM

## 2023-09-25 DIAGNOSIS — F1910 Other psychoactive substance abuse, uncomplicated: Secondary | ICD-10-CM | POA: Diagnosis not present

## 2023-09-25 DIAGNOSIS — F32A Depression, unspecified: Secondary | ICD-10-CM

## 2023-09-25 DIAGNOSIS — R0602 Shortness of breath: Secondary | ICD-10-CM

## 2023-09-25 DIAGNOSIS — G894 Chronic pain syndrome: Secondary | ICD-10-CM

## 2023-09-25 DIAGNOSIS — R5383 Other fatigue: Secondary | ICD-10-CM | POA: Diagnosis not present

## 2023-09-25 DIAGNOSIS — R413 Other amnesia: Secondary | ICD-10-CM | POA: Diagnosis not present

## 2023-09-25 DIAGNOSIS — F419 Anxiety disorder, unspecified: Secondary | ICD-10-CM | POA: Diagnosis not present

## 2023-09-25 NOTE — Progress Notes (Signed)
 Established patient visit  Patient: Kenneth Bautista   DOB: 12/18/1985   38 y.o. Male  MRN: [id number] Visit Date: 09/25/2023  Today's healthcare provider: Blane Bunting, PA-C   Chief Complaint  Patient presents with   Follow-up    F/u  knot on lt wrist.   Subjective      Discussed the use of AI scribe software for clinical note transcription with the patient, who gave verbal consent to proceed.  History of Present Illness The patient, with a history of brain damage, depression, anxiety, and chronic pain, presents for a routine check-up and medication review. He expresses difficulty remembering the names and purposes of his medications, including donepezil  (Aricept ), gabapentin , trazodone , and paroxetine . He also reports ongoing issues with memory and cognition, which have been previously addressed with a neurologist.  The patient is also dealing with chronic pain, for which he has seen orthopedics and pain management specialists. However, he reports that his current providers are unable to further assist him, and he is seeking a new provider for his pain management.  In addition to these issues, the patient reports increased irritability and "snapping" easily, suggesting a possible exacerbation of his underlying mental health conditions. He was previously referred to behavioral health services, but the appointment was cancelled due to transportation issues.  The patient also mentions a physical concern with his left hand, which he describes as having a "bump" that causes pain when pressed or moved in certain ways. He also reports occasional shortness of breath and chest pain, particularly when walking.       07/18/2023   11:10 AM 04/14/2023    1:02 PM 07/30/2022   10:30 AM  Depression screen PHQ 2/9  Decreased Interest 0 0 0  Down, Depressed, Hopeless 0 2 1  PHQ - 2 Score 0 2 1  Altered sleeping  2 3  Tired, decreased energy  2 3  Change in appetite  0 0  Feeling bad or  failure about yourself   1 0  Trouble concentrating  0 3  Moving slowly or fidgety/restless  0 2  Suicidal thoughts  0 0  PHQ-9 Score  7 12  Difficult doing work/chores  Very difficult       07/30/2022   10:30 AM 10/04/2021   11:50 AM 08/23/2021    1:33 PM 08/13/2021   11:27 AM  GAD 7 : Generalized Anxiety Score  Nervous, Anxious, on Edge 2     Control/stop worrying 1     Worry too much - different things 2     Trouble relaxing 3     Restless 3     Easily annoyed or irritable 3     Afraid - awful might happen 3     Total GAD 7 Score 17     Anxiety Difficulty         Information is confidential and restricted. Go to Review Flowsheets to unlock data.    Medications: Outpatient Medications Prior to Visit  Medication Sig   albuterol  (VENTOLIN  HFA) 108 (90 Base) MCG/ACT inhaler Inhale 2 puffs into the lungs every 6 (six) hours as needed for wheezing or shortness of breath.   aspirin  EC 325 MG tablet Take 1 tablet (325 mg total) by mouth daily.   donepezil  (ARICEPT ) 5 MG tablet Take 1 tablet (5 mg total) by mouth at bedtime.   gabapentin  (NEURONTIN ) 300 MG capsule Take 1 capsule (300 mg total) by mouth 2 (two) times daily AND 2 capsules (600 mg  total) at bedtime. Do not take with alcohol..   gabapentin  (NEURONTIN ) 300 MG capsule Take 1 capsule (300 mg total) by mouth 2 (two) times daily AND 2 capsules (600 mg total) at bedtime. Do not take with alcohol.   hydrOXYzine  (ATARAX ) 25 MG tablet Take 1 tablet (25 mg total) by mouth at bedtime.   meloxicam  (MOBIC ) 15 MG tablet Take 1 tablet (15 mg total) by mouth daily.   PARoxetine  (PAXIL ) 40 MG tablet Take 1 tablet (40 mg total) by mouth daily.   PARoxetine  (PAXIL ) 40 MG tablet Take 1 tablet (40 mg total) by mouth daily.   potassium chloride  SA (KLOR-CON  M) 20 MEQ tablet Take 1 tablet (20 mEq total) by mouth daily.   traZODone  (DESYREL ) 100 MG tablet Take 2 tablets (200 mg total) by mouth at bedtime.   traZODone  (DESYREL ) 100 MG tablet Take 2  tablets (200 mg total) by mouth at bedtime.   No facility-administered medications prior to visit.    Review of Systems All negative Except see HPI       Objective    BP (!) 109/59 (BP Location: Right Arm, Patient Position: Sitting, Cuff Size: Normal)   Pulse 62   Resp 16   Ht 5\' 6"  (1.676 m)   Wt 154 lb 14.4 oz (70.3 kg)   SpO2 96%   BMI 25.00 kg/m     Physical Exam Vitals reviewed.  Constitutional:      General: He is not in acute distress.    Appearance: Normal appearance. He is not diaphoretic.  HENT:     Head: Normocephalic and atraumatic.  Eyes:     General: No scleral icterus.    Conjunctiva/sclera: Conjunctivae normal.  Cardiovascular:     Rate and Rhythm: Normal rate and regular rhythm.     Pulses: Normal pulses.     Heart sounds: Normal heart sounds. No murmur heard. Pulmonary:     Effort: Pulmonary effort is normal. No respiratory distress.     Breath sounds: Normal breath sounds. No wheezing or rhonchi.  Musculoskeletal:     Cervical back: Neck supple.     Right lower leg: No edema.     Left lower leg: No edema.  Lymphadenopathy:     Cervical: No cervical adenopathy.  Skin:    General: Skin is warm and dry.     Findings: No rash.  Neurological:     Mental Status: He is alert and oriented to person, place, and time. Mental status is at baseline.  Psychiatric:        Mood and Affect: Mood normal.        Behavior: Behavior normal.      No results found for any visits on 09/25/23.      Assessment & Plan Chest pain and shortness of breath Fatigue Chronic  intermittent chest pain and dyspnea with exertion. Smoking may contribute. - Order chest x-ray. - Perform blood work for underlying causes. Will follow-up Smoking Smokes 4-6 cigarettes daily, reduced from previous levels. Emphasized cessation for health. - Encourage continued reduction with goal of cessation.  Depression and anxiety Polysubstance abuse Ongoing symptoms not  well-managed by paroxetine . Missed behavioral health appointment. - Contact behavioral health to reschedule or visit [facility] for walk-in. - Consider prescribing paroxetine  if not already prescribed. - Bring all medications to next appointment for review.  Memory loss Reports memory issues, on donepezil . Referred to neurology.  Chronic pain syndrome  pain management Leg pain with previous orthopedic interventions exhausted. Referred to  [facility] pain clinic. - Contact [facility] for orthopedic and pain management evaluation.   hypokalemia - Lipid panel - Comprehensive metabolic panel with GFR  Elevated cholesterol - Lipid panel - Comprehensive metabolic panel with GFR  Shortness of breath - CBC with Differential/Platelet - DG Chest 2 View; Future  Other fatigue - Hemoglobin A1c - TSH  Mass of arm, left Tender without swelling or redness - US  LT UPPER EXTREM LTD SOFT TISSUE NON VASCULAR; Future Will reassess  Orders Placed This Encounter  Procedures   Lipid panel    Has the patient fasted?:   Yes   Comprehensive metabolic panel with GFR    Has the patient fasted?:   Yes    No follow-ups on file.   The patient was advised to call back or seek an in-person evaluation if the symptoms worsen or if the condition fails to improve as anticipated.  I discussed the assessment and treatment plan with the patient. The patient was provided an opportunity to ask questions and all were answered. The patient agreed with the plan and demonstrated an understanding of the instructions.  I, Ledonna Dormer, PA-C have reviewed all documentation for this visit. The documentation on 09/25/2023  for the exam, diagnosis, procedures, and orders are all accurate and complete.  Blane Bunting, [facility], MMS [facility] [phone] (phone) [phone] (fax)  [facility]

## 2023-09-30 ENCOUNTER — Ambulatory Visit

## 2023-10-01 ENCOUNTER — Ambulatory Visit: Attending: Physician Assistant
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
## 2023-10-14 ENCOUNTER — Ambulatory Visit: Admitting: Podiatry

## 2023-10-28 ENCOUNTER — Ambulatory Visit (INDEPENDENT_AMBULATORY_CARE_PROVIDER_SITE_OTHER): Admitting: Physician Assistant

## 2023-10-28 ENCOUNTER — Other Ambulatory Visit: Payer: Self-pay

## 2023-10-28 ENCOUNTER — Encounter: Payer: Self-pay | Admitting: Physician Assistant

## 2023-10-28 VITALS — BP 109/74 | HR 63 | Resp 16 | Ht 66.0 in | Wt 152.0 lb

## 2023-10-28 DIAGNOSIS — Z720 Tobacco use: Secondary | ICD-10-CM | POA: Diagnosis not present

## 2023-10-28 DIAGNOSIS — R413 Other amnesia: Secondary | ICD-10-CM

## 2023-10-28 DIAGNOSIS — F419 Anxiety disorder, unspecified: Secondary | ICD-10-CM | POA: Diagnosis not present

## 2023-10-28 DIAGNOSIS — F322 Major depressive disorder, single episode, severe without psychotic features: Secondary | ICD-10-CM

## 2023-10-28 DIAGNOSIS — R5383 Other fatigue: Secondary | ICD-10-CM | POA: Diagnosis not present

## 2023-10-28 DIAGNOSIS — R0602 Shortness of breath: Secondary | ICD-10-CM

## 2023-10-28 DIAGNOSIS — F1910 Other psychoactive substance abuse, uncomplicated: Secondary | ICD-10-CM | POA: Diagnosis not present

## 2023-10-28 DIAGNOSIS — G894 Chronic pain syndrome: Secondary | ICD-10-CM

## 2023-10-28 DIAGNOSIS — Z23 Encounter for immunization: Secondary | ICD-10-CM | POA: Diagnosis not present

## 2023-10-28 DIAGNOSIS — Z5900 Homelessness unspecified: Secondary | ICD-10-CM | POA: Diagnosis not present

## 2023-10-28 DIAGNOSIS — J4541 Moderate persistent asthma with (acute) exacerbation: Secondary | ICD-10-CM

## 2023-10-28 DIAGNOSIS — Z0000 Encounter for general adult medical examination without abnormal findings: Secondary | ICD-10-CM

## 2023-10-28 DIAGNOSIS — J438 Other emphysema: Secondary | ICD-10-CM

## 2023-10-28 MED ORDER — HYDROXYZINE HCL 25 MG PO TABS
25.0000 mg | ORAL_TABLET | Freq: Every day | ORAL | 0 refills | Status: DC
  Filled 2023-10-28: qty 30, 30d supply, fill #0

## 2023-10-28 MED ORDER — GABAPENTIN 300 MG PO CAPS
ORAL_CAPSULE | ORAL | 1 refills | Status: DC
Start: 2023-10-28 — End: 2024-04-14
  Filled 2023-10-28: qty 120, 30d supply, fill #0
  Filled 2023-12-05: qty 120, 30d supply, fill #1

## 2023-10-28 MED ORDER — FLUTICASONE-SALMETEROL 100-50 MCG/ACT IN AEPB
1.0000 | INHALATION_SPRAY | Freq: Two times a day (BID) | RESPIRATORY_TRACT | 1 refills | Status: DC
  Filled 2023-10-28: qty 60, 30d supply, fill #0
  Filled 2023-12-05: qty 60, 30d supply, fill #1

## 2023-10-28 MED ORDER — ALBUTEROL SULFATE HFA 108 (90 BASE) MCG/ACT IN AERS
2.0000 | INHALATION_SPRAY | Freq: Four times a day (QID) | RESPIRATORY_TRACT | 0 refills | Status: DC | PRN
  Filled 2023-10-28: qty 8.5, 17d supply, fill #0

## 2023-10-28 NOTE — Progress Notes (Unsigned)
 Established patient visit  Patient: Kenneth Bautista   DOB: 1985-09-16   38 y.o. Male  MRN: [id number] Visit Date: 10/28/2023  Today's healthcare provider: Blane Bunting, PA-C   Chief Complaint  Patient presents with   Follow-up    F/u on Chronic Pain.. needs referral for chest/ wrist xray wasn't able to get done before. And needs refills.Pt complains of Fatigue . (Wants Pneumo Vaccine) Pt wants referral to [facility].   Subjective     HPI     Follow-up    Additional comments: F/u on Chronic Pain.. needs referral for chest/ wrist xray wasn't able to get done before. And needs refills.Pt complains of Fatigue . (Wants Pneumo Vaccine) Pt wants referral to [facility] for [facility] on [facility].      Last edited by Estill Hemming, CMA on 10/28/2023  1:59 PM.       Discussed the use of AI scribe software for clinical note transcription with the patient, who gave verbal consent to proceed.  History of Present Illness        10/28/2023    2:10 PM 07/18/2023   11:10 AM 04/14/2023    1:02 PM  Depression screen PHQ 2/9  Decreased Interest 3 0 0  Down, Depressed, Hopeless 3 0 2  PHQ - 2 Score 6 0 2  Altered sleeping 3  2  Tired, decreased energy 3  2  Change in appetite 1  0  Feeling bad or failure about yourself  0  1  Trouble concentrating 3  0  Moving slowly or fidgety/restless 0  0  Suicidal thoughts 2  0  PHQ-9 Score 18  7  Difficult doing work/chores   Very difficult      10/28/2023    2:10 PM 07/30/2022   10:30 AM 10/04/2021   11:50 AM 08/23/2021    1:33 PM  GAD 7 : Generalized Anxiety Score  Nervous, Anxious, on Edge 2 2    Control/stop worrying 3 1    Worry too much - different things 3 2    Trouble relaxing 3 3    Restless 2 3    Easily annoyed or irritable 0 3    Afraid - awful might happen 0 3    Total GAD 7 Score 13 17    Anxiety Difficulty Not difficult at all        Information is confidential and restricted. Go to Review Flowsheets to unlock  data.    Medications: Outpatient Medications Prior to Visit  Medication Sig   aspirin  EC 325 MG tablet Take 1 tablet (325 mg total) by mouth daily.   donepezil  (ARICEPT ) 5 MG tablet Take 1 tablet (5 mg total) by mouth at bedtime.   gabapentin  (NEURONTIN ) 300 MG capsule Take 1 capsule (300 mg total) by mouth 2 (two) times daily AND 2 capsules (600 mg total) at bedtime. Do not take with alcohol.   meloxicam  (MOBIC ) 15 MG tablet Take 1 tablet (15 mg total) by mouth daily.   PARoxetine  (PAXIL ) 40 MG tablet Take 1 tablet (40 mg total) by mouth daily.   potassium chloride  SA (KLOR-CON  M) 20 MEQ tablet Take 1 tablet (20 mEq total) by mouth daily.   traZODone  (DESYREL ) 100 MG tablet Take 2 tablets (200 mg total) by mouth at bedtime.   traZODone  (DESYREL ) 100 MG tablet Take 2 tablets (200 mg total) by mouth at bedtime.   [DISCONTINUED] albuterol  (VENTOLIN  HFA) 108 (90 Base) MCG/ACT inhaler Inhale 2 puffs  into the lungs every 6 (six) hours as needed for wheezing or shortness of breath.   [DISCONTINUED] gabapentin  (NEURONTIN ) 300 MG capsule Take 1 capsule (300 mg total) by mouth 2 (two) times daily AND 2 capsules (600 mg total) at bedtime. Do not take with alcohol..   [DISCONTINUED] hydrOXYzine  (ATARAX ) 25 MG tablet Take 1 tablet (25 mg total) by mouth at bedtime.   PARoxetine  (PAXIL ) 40 MG tablet Take 1 tablet (40 mg total) by mouth daily.   No facility-administered medications prior to visit.    Review of Systems All negative Except see HPI   {Insert previous labs (optional):23779} {See past labs  Heme  Chem  Endocrine  Serology  Results Review (optional):1}   Objective    BP 109/74 (BP Location: Right Arm, Patient Position: Sitting)   Pulse 63   Resp 16   Ht 5\' 6"  (1.676 m)   Wt 152 lb (68.9 kg)   SpO2 100%   BMI 24.53 kg/m  {Insert last BP/Wt (optional):23777}{See vitals history (optional):1}   Physical Exam   No results found for any visits on 10/28/23.      Assessment  and Plan Assessment & Plan     Orders Placed This Encounter  Procedures   Pneumococcal conjugate vaccine 20-valent    No follow-ups on file.   The patient was advised to call back or seek an in-person evaluation if the symptoms worsen or if the condition fails to improve as anticipated.  I discussed the assessment and treatment plan with the patient. The patient was provided an opportunity to ask questions and all were answered. The patient agreed with the plan and demonstrated an understanding of the instructions.  I, Asako Saliba, PA-C have reviewed all documentation for this visit. The documentation on 10/28/2023  for the exam, diagnosis, procedures, and orders are all accurate and complete.  Blane Bunting, [facility], MMS [facility] [phone] (phone) [phone] (fax)  [facility]

## 2023-10-29 DIAGNOSIS — F322 Major depressive disorder, single episode, severe without psychotic features: Secondary | ICD-10-CM | POA: Insufficient documentation

## 2023-10-29 DIAGNOSIS — R0602 Shortness of breath: Secondary | ICD-10-CM | POA: Insufficient documentation

## 2023-10-29 DIAGNOSIS — J438 Other emphysema: Secondary | ICD-10-CM | POA: Insufficient documentation

## 2023-10-29 LAB — LIPID PANEL
Chol/HDL Ratio: 3.5 ratio (ref 0.0–5.0)
Cholesterol, Total: 204 mg/dL — ABNORMAL HIGH (ref 100–199)
HDL: 58 mg/dL (ref >39)
LDL Chol Calc (NIH): 95 mg/dL (ref 0–99)
Triglycerides: 308 mg/dL — ABNORMAL HIGH (ref 0–149)
VLDL Cholesterol Cal: 51 mg/dL — ABNORMAL HIGH (ref 5–40)

## 2023-10-29 LAB — COMPREHENSIVE METABOLIC PANEL WITH GFR
ALT: 12 IU/L (ref 0–44)
AST: 18 IU/L (ref 0–40)
Albumin: 5 g/dL (ref 4.1–5.1)
Alkaline Phosphatase: 104 IU/L (ref 44–121)
BUN/Creatinine Ratio: 18 (ref 9–20)
BUN: 14 mg/dL (ref 6–20)
Bilirubin Total: <0.2 mg/dL (ref 0.0–1.2)
CO2: 21 mmol/L (ref 20–29)
Calcium: 9.7 mg/dL (ref 8.7–10.2)
Chloride: 100 mmol/L (ref 96–106)
Creatinine, Ser: 0.78 mg/dL (ref 0.76–1.27)
Globulin, Total: 2.1 g/dL (ref 1.5–4.5)
Glucose: 90 mg/dL (ref 70–99)
Potassium: 4.4 mmol/L (ref 3.5–5.2)
Sodium: 138 mmol/L (ref 134–144)
Total Protein: 7.1 g/dL (ref 6.0–8.5)
eGFR: 116 mL/min/{1.73_m2} (ref >59)

## 2023-10-29 LAB — CBC WITH DIFFERENTIAL/PLATELET
Basophils Absolute: 0.1 10*3/uL (ref 0.0–0.2)
Basos: 1 %
EOS (ABSOLUTE): 0.2 10*3/uL (ref 0.0–0.4)
Eos: 3 %
Hematocrit: 44.5 % (ref 37.5–51.0)
Hemoglobin: 14.7 g/dL (ref 13.0–17.7)
Immature Grans (Abs): 0 10*3/uL (ref 0.0–0.1)
Immature Granulocytes: 0 %
Lymphocytes Absolute: 2 10*3/uL (ref 0.7–3.1)
Lymphs: 29 %
MCH: 31.7 pg (ref 26.6–33.0)
MCHC: 33 g/dL (ref 31.5–35.7)
MCV: 96 fL (ref 79–97)
Monocytes Absolute: 0.5 10*3/uL (ref 0.1–0.9)
Monocytes: 7 %
Neutrophils Absolute: 4.1 10*3/uL (ref 1.4–7.0)
Neutrophils: 60 %
Platelets: 335 10*3/uL (ref 150–450)
RBC: 4.63 x10E6/uL (ref 4.14–5.80)
RDW: 13.4 % (ref 11.6–15.4)
WBC: 6.9 10*3/uL (ref 3.4–10.8)

## 2023-10-29 LAB — TSH: TSH: 0.995 u[IU]/mL (ref 0.450–4.500)

## 2023-10-29 LAB — HEMOGLOBIN A1C
Est. average glucose Bld gHb Est-mCnc: 111 mg/dL
Hgb A1c MFr Bld: 5.5 % (ref 4.8–5.6)

## 2023-10-30 ENCOUNTER — Ambulatory Visit: Payer: Self-pay | Admitting: Physician Assistant

## 2023-10-30 ENCOUNTER — Telehealth (HOSPITAL_BASED_OUTPATIENT_CLINIC_OR_DEPARTMENT_OTHER): Payer: Self-pay | Admitting: Orthopaedic Surgery
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
## 2023-10-30 NOTE — Telephone Encounter (Signed)
 Patient needs medical records sent to [facility] [address] [place], Fax number [phone].

## 2023-11-07 DIAGNOSIS — M25561 Pain in right knee: Secondary | ICD-10-CM | POA: Diagnosis not present

## 2023-11-07 DIAGNOSIS — M25562 Pain in left knee: Secondary | ICD-10-CM | POA: Diagnosis not present

## 2023-11-07 DIAGNOSIS — M898X6 Other specified disorders of bone, lower leg: Secondary | ICD-10-CM | POA: Diagnosis not present

## 2023-11-09 ENCOUNTER — Other Ambulatory Visit: Payer: Self-pay

## 2023-11-09 MED ORDER — DICLOFENAC SODIUM 1 % EX GEL
2.0000 g | Freq: Four times a day (QID) | CUTANEOUS | 2 refills | Status: AC
  Filled 2023-11-09: qty 200, 25d supply, fill #0
  Filled 2023-11-10: qty 200, 30d supply, fill #0
  Filled 2023-12-05: qty 200, 30d supply, fill #1
  Filled 2024-01-03 – 2024-02-01 (×2): qty 200, 30d supply, fill #2
  Filled 2024-02-29: qty 200, 7d supply, fill #2

## 2023-11-10 ENCOUNTER — Other Ambulatory Visit (HOSPITAL_COMMUNITY): Payer: Self-pay

## 2023-11-10 ENCOUNTER — Other Ambulatory Visit: Payer: Self-pay

## 2023-11-12 ENCOUNTER — Emergency Department

## 2023-11-12 ENCOUNTER — Other Ambulatory Visit: Payer: Self-pay

## 2023-11-12 ENCOUNTER — Emergency Department: Admission: EM | Admit: 2023-11-12 | Discharge: 2023-11-12 | Disposition: A | Discharge status: Home / Self Care

## 2023-11-12 ENCOUNTER — Encounter: Payer: Self-pay | Admitting: *Deleted

## 2023-11-12 DIAGNOSIS — M25561 Pain in right knee: Secondary | ICD-10-CM | POA: Diagnosis not present

## 2023-11-12 DIAGNOSIS — M25562 Pain in left knee: Secondary | ICD-10-CM | POA: Diagnosis not present

## 2023-11-12 DIAGNOSIS — Z5900 Homelessness unspecified: Secondary | ICD-10-CM | POA: Diagnosis not present

## 2023-11-12 DIAGNOSIS — M170 Bilateral primary osteoarthritis of knee: Secondary | ICD-10-CM | POA: Diagnosis not present

## 2023-11-12 MED ORDER — ACETAMINOPHEN 325 MG PO TABS
650.0000 mg | ORAL_TABLET | Freq: Once | ORAL | Status: AC
  Administered 2023-11-12: 650 mg via ORAL
  Filled 2023-11-12: qty 2

## 2023-11-12 MED ORDER — ACETAMINOPHEN ER 650 MG PO TBCR
650.0000 mg | EXTENDED_RELEASE_TABLET | Freq: Three times a day (TID) | ORAL | 0 refills | Status: AC | PRN
  Filled 2023-11-12: qty 100, 34d supply, fill #0

## 2023-11-12 NOTE — Discharge Instructions (Addendum)
 You have been diagnosed with bilateral knee pain.  Knee x-ray did not show any fractures.  Please take acetaminophen  650 mg every 6 hours for pain.  Please make an appointment with [facility] for a follow-up.  Come back to ED or go to your PCP if you have new symptoms or symptoms worsen.

## 2023-11-12 NOTE — ED Provider Notes (Signed)
 [facility] Provider Note    Event Date/Time   First MD Initiated Contact with Patient 11/12/23 1952     (approximate)   History   Leg Pain    HPI  Kenneth Bautista is a 38 y.o. male    with a past medical history of chronic pain syndrome, polysubstance abuse, memory problem, elevated cholesterol, anxiety and depression, who presents to the ED complaining of bilateral leg pain. According to the patient, he went to [facility] last Thursday he got x-ray of his knees and they diagnosed arthritis.  Last Saturday he felt on his knee, with increase pain and stiffness.  Patient is homeless.  He is not taking any blood thinners.  Patient has history of bilateral knee surgery.     ROS: Patient currently denies any vision changes, tinnitus, difficulty speaking, facial droop, sore throat, chest pain, shortness of breath, abdominal pain, nausea/vomiting/diarrhea, dysuria, or weakness/numbness/paresthesias in any extremity   Physical Exam   Triage Vital Signs: ED Triage Vitals  Encounter Vitals Group     BP 11/12/23 1938 130/83     Systolic BP Percentile --      Diastolic BP Percentile --      Pulse Rate 11/12/23 1938 63     Resp 11/12/23 1938 18     Temp 11/12/23 1938 98.1 F (36.7 C)     Temp Source 11/12/23 1938 Oral     SpO2 11/12/23 1938 100 %     Weight 11/12/23 1939 145 lb (65.8 kg)     Height 11/12/23 1939 5' 6 (1.676 m)     Head Circumference --      Peak Flow --      Pain Score 11/12/23 1939 8     Pain Loc --      Pain Education --      Exclude from Growth Chart --     Most recent vital signs: Vitals:   11/12/23 1938  BP: 130/83  Pulse: 63  Resp: 18  Temp: 98.1 F (36.7 C)  SpO2: 100%    Vitals and nursing note reviewed.    Constitutional: Alert, NAD. Able to speak in complete sentences without cough or dyspnea  Eyes: Conjunctivae are normal.  Head: Atraumatic. Nose: No congestion/rhinnorhea. Mouth/Throat: Mucous membranes are  moist.   Neck: Painless ROM. Supple. No JVD, nodes, thyromegaly  Cardiovascular:   Good peripheral circulation.RRR no murmurs, gallops, rubs  Respiratory: Normal respiratory effort.  No retractions. Clear to auscultation bilaterally without wheezing or crackles  Gastrointestinal: Soft and nontender.  Musculoskeletal:  no deformity Neurologic:  MAE spontaneously. No gross focal neurologic deficits are appreciated.  Skin:  Skin is warm, dry and intact. No rash noted. Psychiatric: Mood and affect are normal. Speech and behavior are normal.    ED Results / Procedures / Treatments   Labs (all labs ordered are listed, but only abnormal results are displayed) Labs Reviewed - No data to display   EKG  RADIOLOGY I independently reviewed and interpreted imaging and agree with radiologists findings.      PROCEDURES:  Critical Care performed:   Procedures   MEDICATIONS ORDERED IN ED: Medications  acetaminophen  (TYLENOL ) tablet 650 mg (has no administration in time range)   Clinical Course as of 11/12/23 2102  Wed Nov 12, 2023  2055 DG Knee Complete 4 Views Left No definite acute osseous abnormality. 2. Chronic fracture deformities of the proximal tibia and fibula. Stable appearance of proximal tibial hardware   [AE]  2101 DG Knee Complete 4 Views Right No acute osseous abnormality. Postoperative changes with stable appearance of the hardware   [AE]    Clinical Course User Index [AE] Awilda Lennox, PA-C    IMPRESSION / MDM / ASSESSMENT AND PLAN / ED COURSE  I reviewed the triage vital signs and the nursing notes.  Differential diagnosis includes, but is not limited to, knee fracture, knee dislocation, screw rupture  Patient's presentation is most consistent with acute complicated illness / injury requiring diagnostic workup.   Kenneth Bautista is a 38 y.o., male [place] [facility] today complaining of bilateral knee pain after falling last Saturday.  Patient states last  Thursday was seeing a number of [facility] having diagnosis of arthritis on both knees.  Patient denies fever or signs of infection.  Patient is homeless. Patient's diagnosis is consistent with bilateral knee pain, bilateral knee arthritis. I independently reviewed and interpreted imaging and agree with radiologists findings. Labs are  reassuring. I did review the patient's allergies and medications.The patient is in stable and satisfactory condition for discharge home  Patient will be discharged home with prescriptions for acetaminophen  650 mg. Patient is to follow up with [facility] as needed or otherwise directed.  During admission patient received acetaminophen  650 mg for pain due to pharmacy hours. Patient is given ED precautions to return to the ED for any worsening or new symptoms. Discussed plan of care with patient, answered all of patient's questions, Patient agreeable to plan of care. Advised patient to take medications according to the instructions on the label. Discussed possible side effects of new medications. Patient verbalized understanding.  FINAL CLINICAL IMPRESSION(S) / ED DIAGNOSES   Final diagnoses:  Pain in both knees, unspecified chronicity     Rx / DC Orders   ED Discharge Orders          Ordered    acetaminophen  (ACETAMINOPHEN  8 HOUR) 650 MG CR tablet  Every 8 hours PRN        11/12/23 2102             Note:  This document was prepared using Dragon voice recognition software and may include unintentional dictation errors.   Awilda Lennox, PA-C 11/12/23 2102    Collis Deaner, MD 11/13/23 [phone]
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
## 2023-11-13 ENCOUNTER — Other Ambulatory Visit: Payer: Self-pay

## 2023-11-14 ENCOUNTER — Other Ambulatory Visit: Payer: Self-pay

## 2023-11-17 ENCOUNTER — Other Ambulatory Visit: Payer: Self-pay

## 2023-11-17 DIAGNOSIS — J4541 Moderate persistent asthma with (acute) exacerbation: Secondary | ICD-10-CM

## 2023-11-17 MED ORDER — ALBUTEROL SULFATE HFA 108 (90 BASE) MCG/ACT IN AERS
2.0000 | INHALATION_SPRAY | Freq: Four times a day (QID) | RESPIRATORY_TRACT | 0 refills | Status: DC | PRN
  Filled 2023-11-17: qty 6.7, 25d supply, fill #0

## 2023-11-20 ENCOUNTER — Other Ambulatory Visit: Payer: Self-pay

## 2023-12-05 ENCOUNTER — Other Ambulatory Visit: Payer: Self-pay | Admitting: Family Medicine

## 2023-12-05 ENCOUNTER — Other Ambulatory Visit: Payer: Self-pay | Admitting: Physician Assistant

## 2023-12-05 DIAGNOSIS — E876 Hypokalemia: Secondary | ICD-10-CM

## 2023-12-06 ENCOUNTER — Other Ambulatory Visit: Payer: Self-pay

## 2023-12-07 ENCOUNTER — Other Ambulatory Visit: Payer: Self-pay

## 2023-12-08 ENCOUNTER — Other Ambulatory Visit: Payer: Self-pay | Admitting: Physician Assistant

## 2023-12-08 ENCOUNTER — Other Ambulatory Visit: Payer: Self-pay

## 2023-12-08 ENCOUNTER — Other Ambulatory Visit: Payer: Self-pay | Admitting: Family Medicine

## 2023-12-08 DIAGNOSIS — F322 Major depressive disorder, single episode, severe without psychotic features: Secondary | ICD-10-CM

## 2023-12-08 DIAGNOSIS — F419 Anxiety disorder, unspecified: Secondary | ICD-10-CM

## 2023-12-08 DIAGNOSIS — E876 Hypokalemia: Secondary | ICD-10-CM

## 2023-12-09 ENCOUNTER — Other Ambulatory Visit: Payer: Self-pay

## 2023-12-09 MED FILL — Potassium Chloride Microencapsulated Crys ER Tab 20 mEq: ORAL | 90 days supply | Qty: 90 | Fill #0 | Status: AC

## 2023-12-09 MED FILL — Paroxetine HCl Tab 40 MG: ORAL | 90 days supply | Qty: 90 | Fill #0 | Status: AC

## 2023-12-10 MED ORDER — PAROXETINE HCL 40 MG PO TABS
40.0000 mg | ORAL_TABLET | Freq: Every day | ORAL | 1 refills | Status: AC
  Filled 2023-12-10 – 2024-06-14 (×5): qty 90, 90d supply, fill #0

## 2023-12-10 NOTE — Telephone Encounter (Signed)
 For refill of trazodone , pt should be seen. Unclear if he has been taking it

## 2023-12-11 ENCOUNTER — Other Ambulatory Visit: Payer: Self-pay

## 2023-12-15 ENCOUNTER — Ambulatory Visit: Payer: Self-pay
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
## 2023-12-28 NOTE — Progress Notes (Deleted)
 Established patient visit  Patient: Kenneth Bautista   DOB: 05-29-1986   38 y.o. Male  MRN: [id number] Visit Date: 12/29/2023  Today's healthcare provider: Jolynn Spencer, PA-C   No chief complaint on file.  Subjective       Discussed the use of AI scribe software for clinical note transcription with the patient, who gave verbal consent to proceed.  History of Present Illness        10/28/2023    2:10 PM 07/18/2023   11:10 AM 04/14/2023    1:02 PM  Depression screen PHQ 2/9  Decreased Interest 3 0 0  Down, Depressed, Hopeless 3 0 2  PHQ - 2 Score 6 0 2  Altered sleeping 3  2  Tired, decreased energy 3  2  Change in appetite 1  0  Feeling bad or failure about yourself  0  1  Trouble concentrating 3  0  Moving slowly or fidgety/restless 0  0  Suicidal thoughts 2  0  PHQ-9 Score 18  7  Difficult doing work/chores   Very difficult      10/28/2023    2:10 PM 07/30/2022   10:30 AM 10/04/2021   11:50 AM 08/23/2021    1:33 PM  GAD 7 : Generalized Anxiety Score  Nervous, Anxious, on Edge 2 2    Control/stop worrying 3 1    Worry too much - different things 3 2    Trouble relaxing 3 3    Restless 2 3    Easily annoyed or irritable 0 3    Afraid - awful might happen 0 3    Total GAD 7 Score 13 17    Anxiety Difficulty Not difficult at all        Information is confidential and restricted. Go to Review Flowsheets to unlock data.    Medications: Outpatient Medications Prior to Visit  Medication Sig  . acetaminophen  (ACETAMINOPHEN  8 HOUR) 650 MG CR tablet Take 1 tablet (650 mg total) by mouth every 8 (eight) hours as needed for pain.  . albuterol  (VENTOLIN  HFA) 108 (90 Base) MCG/ACT inhaler Inhale 2 puffs into the lungs every 6 (six) hours as needed for wheezing or shortness of breath.  . aspirin  EC 325 MG tablet Take 1 tablet (325 mg total) by mouth daily.  . diclofenac  Sodium (VOLTAREN ) 1 % GEL Apply 2 grams topically 4 (four) times daily.  . donepezil  (ARICEPT ) 5 MG  tablet Take 1 tablet (5 mg total) by mouth at bedtime.  . fluticasone -salmeterol (WIXELA INHUB ) 100-50 MCG/ACT AEPB Inhale 1 puff into the lungs 2 (two) times daily.  . gabapentin  (NEURONTIN ) 300 MG capsule Take 1 capsule (300 mg total) by mouth 2 (two) times daily AND 2 capsules (600 mg total) at bedtime. Do not take with alcohol.  . gabapentin  (NEURONTIN ) 300 MG capsule Take 1 capsule (300 mg total) by mouth 2 (two) times daily AND 2 capsules (600 mg total) at bedtime. Do not take with alcohol.  . hydrOXYzine  (ATARAX ) 25 MG tablet Take 1 tablet (25 mg total) by mouth at bedtime.  . meloxicam  (MOBIC ) 15 MG tablet Take 1 tablet (15 mg total) by mouth daily.  . PARoxetine  (PAXIL ) 40 MG tablet Take 1 tablet (40 mg total) by mouth daily.  . PARoxetine  (PAXIL ) 40 MG tablet Take 1 tablet (40 mg total) by mouth daily.  . potassium chloride  SA (KLOR-CON  M) 20 MEQ tablet Take 1 tablet (20 mEq total) by mouth daily.  . traZODone  (DESYREL ) 100  MG tablet Take 2 tablets (200 mg total) by mouth at bedtime.  . traZODone  (DESYREL ) 100 MG tablet Take 2 tablets (200 mg total) by mouth at bedtime.   No facility-administered medications prior to visit.    Review of Systems  All other systems reviewed and are negative.  All negative Except see HPI   {Insert previous labs (optional):23779} {See past labs  Heme  Chem  Endocrine  Serology  Results Review (optional):1}   Objective    There were no vitals taken for this visit. {Insert last BP/Wt (optional):23777}{See vitals history (optional):1}   Physical Exam Vitals reviewed.  Constitutional:      General: He is not in acute distress.    Appearance: Normal appearance. He is not diaphoretic.  HENT:     Head: Normocephalic and atraumatic.  Eyes:     General: No scleral icterus.    Conjunctiva/sclera: Conjunctivae normal.  Cardiovascular:     Rate and Rhythm: Normal rate and regular rhythm.     Pulses: Normal pulses.     Heart sounds: Normal  heart sounds. No murmur heard. Pulmonary:     Effort: Pulmonary effort is normal. No respiratory distress.     Breath sounds: Normal breath sounds. No wheezing or rhonchi.  Musculoskeletal:     Cervical back: Neck supple.     Right lower leg: No edema.     Left lower leg: No edema.  Lymphadenopathy:     Cervical: No cervical adenopathy.  Skin:    General: Skin is warm and dry.     Findings: No rash.  Neurological:     Mental Status: He is alert and oriented to person, place, and time. Mental status is at baseline.  Psychiatric:        Mood and Affect: Mood normal.        Behavior: Behavior normal.     No results found for any visits on 12/29/23.      Assessment and Plan Assessment & Plan     No orders of the defined types were placed in this encounter.   No follow-ups on file.   The patient was advised to call back or seek an in-person evaluation if the symptoms worsen or if the condition fails to improve as anticipated.  I discussed the assessment and treatment plan with the patient. The patient was provided an opportunity to ask questions and all were answered. The patient agreed with the plan and demonstrated an understanding of the instructions.  I, Maxtyn Nuzum, PA-C have reviewed all documentation for this visit. The documentation on 12/29/2023  for the exam, diagnosis, procedures, and orders are all accurate and complete.  Jolynn Spencer, [facility], MMS [facility] [phone] (phone) [phone] (fax)  [facility]

## 2023-12-29 ENCOUNTER — Ambulatory Visit: Admitting: Physician Assistant

## 2023-12-29 DIAGNOSIS — G894 Chronic pain syndrome: Secondary | ICD-10-CM

## 2023-12-29 DIAGNOSIS — J4541 Moderate persistent asthma with (acute) exacerbation: Secondary | ICD-10-CM

## 2023-12-29 DIAGNOSIS — R413 Other amnesia: Secondary | ICD-10-CM

## 2023-12-29 DIAGNOSIS — E7800 Pure hypercholesterolemia, unspecified: Secondary | ICD-10-CM

## 2023-12-29 DIAGNOSIS — J438 Other emphysema: Secondary | ICD-10-CM

## 2023-12-29 DIAGNOSIS — F322 Major depressive disorder, single episode, severe without psychotic features: Secondary | ICD-10-CM

## 2023-12-29 DIAGNOSIS — Z720 Tobacco use: Secondary | ICD-10-CM

## 2023-12-29 DIAGNOSIS — Z5900 Homelessness unspecified: Secondary | ICD-10-CM

## 2023-12-29 DIAGNOSIS — R5383 Other fatigue: Secondary | ICD-10-CM

## 2023-12-29 DIAGNOSIS — F419 Anxiety disorder, unspecified: Secondary | ICD-10-CM

## 2023-12-29 DIAGNOSIS — F1910 Other psychoactive substance abuse, uncomplicated: Secondary | ICD-10-CM

## 2023-12-29 DIAGNOSIS — J4520 Mild intermittent asthma, uncomplicated: Secondary | ICD-10-CM

## 2023-12-29 DIAGNOSIS — K219 Gastro-esophageal reflux disease without esophagitis: Secondary | ICD-10-CM

## 2024-01-01 ENCOUNTER — Other Ambulatory Visit: Payer: Self-pay

## 2024-01-01 ENCOUNTER — Emergency Department
Admission: EM | Admit: 2024-01-01 | Discharge: 2024-01-01 | Discharge status: Against Medical Advice | Attending: Emergency Medicine | Admitting: Emergency Medicine

## 2024-01-01 DIAGNOSIS — M79604 Pain in right leg: Secondary | ICD-10-CM | POA: Diagnosis present

## 2024-01-01 DIAGNOSIS — Z5321 Procedure and treatment not carried out due to patient leaving prior to being seen by health care provider: Secondary | ICD-10-CM | POA: Diagnosis not present

## 2024-01-01 DIAGNOSIS — M79605 Pain in left leg: Secondary | ICD-10-CM | POA: Insufficient documentation

## 2024-01-01 NOTE — ED Notes (Signed)
 Randi, EDT notified this RN that the patient has not been in the room for quite a while. This RN nor provider has seen the patient and the patient was not seen walking out by staff. After waiting 10-15 minutes and looking for the patient, this RN notified the provider and will chart the patient out of the ED accordingly.

## 2024-01-01 NOTE — ED Provider Notes (Incomplete)
    [facility] Emergency Department Provider Note     Event Date/Time   First MD Initiated Contact with Patient 01/01/24 2012     (approximate)   History   Leg Pain   HPI  Kenneth Bautista is a 38 y.o. male ***       Physical Exam   Triage Vital Signs: ED Triage Vitals [01/01/24 1944]  Encounter Vitals Group     BP 104/69     Girls Systolic BP Percentile      Girls Diastolic BP Percentile      Boys Systolic BP Percentile      Boys Diastolic BP Percentile      Pulse Rate 81     Resp 17     Temp 97.8 F (36.6 C)     Temp src      SpO2 98 %     Weight      Height      Head Circumference      Peak Flow      Pain Score      Pain Loc      Pain Education      Exclude from Growth Chart     Most recent vital signs: Vitals:   01/01/24 1944  BP: 104/69  Pulse: 81  Resp: 17  Temp: 97.8 F (36.6 C)  SpO2: 98%    General Awake, no distress. *** {**HEENT NCAT. PERRL. EOMI. No rhinorrhea. Mucous membranes are moist. **} CV:  Good peripheral perfusion. *** RESP:  Normal effort. *** ABD:  No distention. *** {**Other: **}   ED Results / Procedures / Treatments   Labs (all labs ordered are listed, but only abnormal results are displayed) Labs Reviewed - No data to display   EKG  ***  RADIOLOGY  {**I personally viewed and evaluated these images as part of my medical decision making, as well as reviewing the written report by the radiologist.  ED Provider Interpretation: ***  No results found.   PROCEDURES:  Critical Care performed: {CriticalCareYesNo:19197::Yes, see critical care procedure note(s),No}  Procedures   MEDICATIONS ORDERED IN ED: Medications - No data to display   IMPRESSION / MDM / ASSESSMENT AND PLAN / ED COURSE  I reviewed the triage vital signs and the nursing notes.                              Differential diagnosis includes, but is not limited to, ***  Patient's presentation is most  consistent with {EM COPA:27473}  {**The patient is on the cardiac monitor to evaluate for evidence of arrhythmia and/or significant heart rate changes.**}  Patient's diagnosis is consistent with ***. Patient will be discharged home with prescriptions for ***. Patient is to follow up with *** as needed or otherwise directed. Patient is given ED precautions to return to the ED for any worsening or new symptoms.     FINAL CLINICAL IMPRESSION(S) / ED DIAGNOSES   Final diagnoses:  None     Rx / DC Orders   ED Discharge Orders     None        Note:  This document was prepared using Dragon voice recognition software and may include unintentional dictation errors.
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
## 2024-01-03 ENCOUNTER — Other Ambulatory Visit: Payer: Self-pay | Admitting: Physical Medicine and Rehabilitation

## 2024-01-03 ENCOUNTER — Other Ambulatory Visit: Payer: Self-pay

## 2024-01-03 ENCOUNTER — Other Ambulatory Visit: Payer: Self-pay | Admitting: Family Medicine

## 2024-01-03 ENCOUNTER — Other Ambulatory Visit: Payer: Self-pay | Admitting: Physician Assistant

## 2024-01-03 DIAGNOSIS — R0602 Shortness of breath: Secondary | ICD-10-CM

## 2024-01-03 DIAGNOSIS — J4541 Moderate persistent asthma with (acute) exacerbation: Secondary | ICD-10-CM

## 2024-01-03 DIAGNOSIS — F32A Depression, unspecified: Secondary | ICD-10-CM

## 2024-01-03 MED FILL — Potassium Chloride Microencapsulated Crys ER Tab 20 mEq: ORAL | 90 days supply | Qty: 90 | Fill #1 | Status: CN

## 2024-01-04 ENCOUNTER — Other Ambulatory Visit: Payer: Self-pay

## 2024-01-05 ENCOUNTER — Other Ambulatory Visit: Payer: Self-pay

## 2024-01-05 ENCOUNTER — Other Ambulatory Visit: Payer: Self-pay | Admitting: Physician Assistant

## 2024-01-05 ENCOUNTER — Other Ambulatory Visit: Payer: Self-pay | Admitting: Physical Medicine and Rehabilitation

## 2024-01-05 DIAGNOSIS — F419 Anxiety disorder, unspecified: Secondary | ICD-10-CM

## 2024-01-05 DIAGNOSIS — J4541 Moderate persistent asthma with (acute) exacerbation: Secondary | ICD-10-CM

## 2024-01-05 DIAGNOSIS — R0602 Shortness of breath: Secondary | ICD-10-CM

## 2024-01-05 MED FILL — Gabapentin Cap 300 MG: ORAL | Qty: 120 | Fill #0 | Status: CN

## 2024-01-05 MED FILL — Paroxetine HCl Tab 40 MG: ORAL | 90 days supply | Qty: 90 | Fill #0 | Status: CN

## 2024-01-05 MED FILL — Hydroxyzine HCl Tab 25 MG: ORAL | 30 days supply | Qty: 30 | Fill #0 | Status: AC

## 2024-01-05 MED FILL — Albuterol Sulfate Inhal Aero 108 MCG/ACT (90MCG Base Equiv): RESPIRATORY_TRACT | 25 days supply | Qty: 6.7 | Fill #0 | Status: AC

## 2024-01-05 MED FILL — Fluticasone-Salmeterol Aer Powder BA 100-50 MCG/ACT: RESPIRATORY_TRACT | 30 days supply | Qty: 60 | Fill #0 | Status: AC

## 2024-01-05 MED FILL — Trazodone HCl Tab 100 MG: ORAL | 30 days supply | Qty: 60 | Fill #0 | Status: AC

## 2024-01-07 ENCOUNTER — Other Ambulatory Visit: Payer: Self-pay
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
## 2024-01-20 DIAGNOSIS — S0990XA Unspecified injury of head, initial encounter: Secondary | ICD-10-CM | POA: Diagnosis not present

## 2024-02-01 ENCOUNTER — Other Ambulatory Visit: Payer: Self-pay

## 2024-02-01 ENCOUNTER — Other Ambulatory Visit: Payer: Self-pay | Admitting: Family Medicine

## 2024-02-01 MED FILL — Trazodone HCl Tab 100 MG: ORAL | 30 days supply | Qty: 60 | Fill #1 | Status: AC

## 2024-02-01 MED FILL — Gabapentin Cap 300 MG: ORAL | 30 days supply | Qty: 120 | Fill #0 | Status: AC

## 2024-02-01 MED FILL — Potassium Chloride Microencapsulated Crys ER Tab 20 mEq: ORAL | 90 days supply | Qty: 90 | Fill #1 | Status: CN

## 2024-02-01 MED FILL — Paroxetine HCl Tab 40 MG: ORAL | 90 days supply | Qty: 90 | Fill #0 | Status: CN

## 2024-02-03 ENCOUNTER — Other Ambulatory Visit: Payer: Self-pay

## 2024-02-03 NOTE — Telephone Encounter (Signed)
 Last dispense on 01/05/24

## 2024-02-04 DIAGNOSIS — H53453 Other localized visual field defect, bilateral: Secondary | ICD-10-CM | POA: Diagnosis not present

## 2024-02-04 LAB — HM DIABETES EYE EXAM

## 2024-02-11 DIAGNOSIS — S0990XA Unspecified injury of head, initial encounter: Secondary | ICD-10-CM | POA: Diagnosis not present

## 2024-02-11 DIAGNOSIS — Z0100 Encounter for examination of eyes and vision without abnormal findings: Secondary | ICD-10-CM | POA: Diagnosis not present

## 2024-02-19 ENCOUNTER — Ambulatory Visit (INDEPENDENT_AMBULATORY_CARE_PROVIDER_SITE_OTHER): Admitting: Physician Assistant

## 2024-02-19 ENCOUNTER — Encounter: Payer: Self-pay | Admitting: Physician Assistant

## 2024-02-19 VITALS — BP 99/60 | HR 62 | Ht 65.0 in | Wt 163.7 lb

## 2024-02-19 DIAGNOSIS — F419 Anxiety disorder, unspecified: Secondary | ICD-10-CM | POA: Diagnosis not present

## 2024-02-19 DIAGNOSIS — J438 Other emphysema: Secondary | ICD-10-CM

## 2024-02-19 DIAGNOSIS — F1910 Other psychoactive substance abuse, uncomplicated: Secondary | ICD-10-CM

## 2024-02-19 DIAGNOSIS — R5383 Other fatigue: Secondary | ICD-10-CM

## 2024-02-19 DIAGNOSIS — F322 Major depressive disorder, single episode, severe without psychotic features: Secondary | ICD-10-CM | POA: Diagnosis not present

## 2024-02-19 DIAGNOSIS — R413 Other amnesia: Secondary | ICD-10-CM

## 2024-02-19 DIAGNOSIS — Z720 Tobacco use: Secondary | ICD-10-CM | POA: Diagnosis not present

## 2024-02-19 DIAGNOSIS — G894 Chronic pain syndrome: Secondary | ICD-10-CM

## 2024-02-19 NOTE — Progress Notes (Signed)
 Established patient visit  Patient: Kenneth Bautista   DOB: 05-04-1986   38 y.o. Male  MRN: [id number] Visit Date: 02/19/2024  Today's healthcare provider: Talullah Abate, PA-C   Chief Complaint  Patient presents with   Medical Management of Chronic Issues    Patient is present due to having trouble sleeping even while taking trazadone and report paroxetine  is no longer helping. He reports he would also like to see about seeing pain mgmt due to bad problem with leg having pain and it is radiating up to his hip   Insomnia    Taking rx and still not able to sleep at night   Depression    Feels like he is no longer tolerating medication as he is becoming irritated very easily and snapping   Pain Management    Would like a referral due to radiation and medications not helping.    Subjective     Discussed the use of AI scribe software for clinical note transcription with the patient, who gave verbal consent to proceed.  History of Present Illness Kenneth Bautista is a 37 year old male with chronic leg and back pain who presents with worsening leg pain and insomnia.  He experiences chronic leg and back pain, with recent worsening of leg pain radiating to the hip, described as stabbing pain with loss of sensation. He has screws in both legs from a previous accident. Over-the-counter muscle relaxants and pain cream have been ineffective. He is currently on gabapentin  for pain management.  He suffers from insomnia and depression, taking trazodone  200 mg at night and paroxetine  40 mg. These medications are not effectively managing his insomnia, as he wakes up every thirty minutes. He has gained weight from 135-145 lbs to 163 lbs, which he attributes to healthier eating.  He uses albuterol  as needed and Advair  daily for respiratory issues. He experiences shortness of breath when walking, which he attributes to smoking half a pack of cigarettes a day. He has not used cocaine in three  weeks.  He reports chest pain and shortness of breath when walking, but no palpitations. He experiences memory loss, forgetting things while talking. His mother has obesity, weighing 330 lbs. He experiences leg swelling after prolonged walking.       02/19/2024    9:50 AM 10/28/2023    2:10 PM 07/18/2023   11:10 AM  Depression screen PHQ 2/9  Decreased Interest 0 3 0  Down, Depressed, Hopeless 3 3 0  PHQ - 2 Score 3 6 0  Altered sleeping 3 3   Tired, decreased energy 3 3   Change in appetite 0 1   Feeling bad or failure about yourself  3 0   Trouble concentrating 0 3   Moving slowly or fidgety/restless 0 0   Suicidal thoughts 0 2   PHQ-9 Score 12 18   Difficult doing work/chores Not difficult at all        02/19/2024    9:51 AM 10/28/2023    2:10 PM 07/30/2022   10:30 AM 10/04/2021   11:50 AM  GAD 7 : Generalized Anxiety Score  Nervous, Anxious, on Edge 3 2 2    Control/stop worrying 3 3 1    Worry too much - different things 3 3 2    Trouble relaxing 3 3 3    Restless 3 2 3    Easily annoyed or irritable 0 0 3   Afraid - awful might happen 3 0 3   Total GAD 7 Score 18  13 17   Anxiety Difficulty Not difficult at all Not difficult at all       Information is confidential and restricted. Go to Review Flowsheets to unlock data.    Medications: Outpatient Medications Prior to Visit  Medication Sig   acetaminophen  (ACETAMINOPHEN  8 HOUR) 650 MG CR tablet Take 1 tablet (650 mg total) by mouth every 8 (eight) hours as needed for pain.   albuterol  (VENTOLIN  HFA) 108 (90 Base) MCG/ACT inhaler Inhale 2 puffs into the lungs every 6 (six) hours as needed for wheezing or shortness of breath.   aspirin  EC 325 MG tablet Take 1 tablet (325 mg total) by mouth daily.   diclofenac  Sodium (VOLTAREN ) 1 % GEL Apply 2 grams topically 4 (four) times daily.   donepezil  (ARICEPT ) 5 MG tablet Take 1 tablet (5 mg total) by mouth at bedtime.   fluticasone -salmeterol (ADVAIR ) 100-50 MCG/ACT AEPB Inhale 1  puff into the lungs 2 (two) times daily.   gabapentin  (NEURONTIN ) 300 MG capsule Take 1 capsule (300 mg total) by mouth 2 (two) times daily AND 2 capsules (600 mg total) at bedtime. Do not take with alcohol.   gabapentin  (NEURONTIN ) 300 MG capsule Take 1 capsule (300 mg total) by mouth 2 (two) times daily AND 2 capsules (600 mg total) at bedtime. Do not take with alcohol.   gabapentin  (NEURONTIN ) 300 MG capsule Take 1 capsule (300 mg total) by mouth 2 (two) times daily AND 2 capsules (600 mg total) at bedtime. Do not take with alcohol.   hydrOXYzine  (ATARAX ) 25 MG tablet Take 1 tablet (25 mg total) by mouth at bedtime.   meloxicam  (MOBIC ) 15 MG tablet Take 1 tablet (15 mg total) by mouth daily.   PARoxetine  (PAXIL ) 40 MG tablet Take 1 tablet (40 mg total) by mouth daily.   PARoxetine  (PAXIL ) 40 MG tablet Take 1 tablet (40 mg total) by mouth daily.   potassium chloride  SA (KLOR-CON  M) 20 MEQ tablet Take 1 tablet (20 mEq total) by mouth daily.   traZODone  (DESYREL ) 100 MG tablet Take 2 tablets (200 mg total) by mouth at bedtime.   traZODone  (DESYREL ) 100 MG tablet Take 2 tablets (200 mg total) by mouth at bedtime.   traZODone  (DESYREL ) 100 MG tablet Take 2 tablets (200 mg total) by mouth at bedtime.   No facility-administered medications prior to visit.    Review of Systems All negative Except see HPI       Objective    BP 99/60 (BP Location: Left Arm, Patient Position: Sitting, Cuff Size: Normal)   Pulse 62   Ht 5' 5 (1.651 m)   Wt 163 lb 11.2 oz (74.3 kg)   SpO2 99%   BMI 27.24 kg/m     Physical Exam Vitals reviewed.  Constitutional:      General: He is not in acute distress.    Appearance: Normal appearance. He is not diaphoretic.  HENT:     Head: Normocephalic and atraumatic.  Eyes:     General: No scleral icterus.    Conjunctiva/sclera: Conjunctivae normal.  Cardiovascular:     Rate and Rhythm: Normal rate and regular rhythm.     Pulses: Normal pulses.     Heart  sounds: Normal heart sounds. No murmur heard. Pulmonary:     Effort: Pulmonary effort is normal. No respiratory distress.     Breath sounds: Normal breath sounds. No wheezing or rhonchi.  Musculoskeletal:     Cervical back: Neck supple.     Right lower  leg: No edema.     Left lower leg: No edema.  Lymphadenopathy:     Cervical: No cervical adenopathy.  Skin:    General: Skin is warm and dry.     Findings: No rash.  Neurological:     Mental Status: He is alert and oriented to person, place, and time. Mental status is at baseline.  Psychiatric:        Mood and Affect: Mood normal.        Behavior: Behavior normal.      No results found for any visits on 02/19/24.      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 - Refer to [facility] for evaluation and potential injections. - Continue gabapentin  as prescribed by orthopedics. 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 Anxiety more severe than depression. - Refer to counseling services available at the clinic. - Continue current medications (trazodone  and paroxetine ) until reassessment by psychiatrist. - Psychiatrist to review counseling notes and adjust medications as needed. Pt was advised to proceed with [facility] for a prompt evaluation and management if needed.  Collaboration of Care: Medication Management AEB  , Primary Care Provider AEB  , Psychiatrist AEB  , and Referral or follow-up with counselor/therapist AEB    Patient/Guardian was advised Release of Information must be obtained prior to any record release in order to collaborate their care with an outside provider. Patient/Guardian was advised if they have not already done so to contact the registration department to sign all necessary forms  in order for us  to release information regarding their care.   Consent: Patient/Guardian gives verbal consent for treatment and assignment of benefits for services provided during this visit. Patient/Guardian expressed understanding and agreed to proceed.   Emphysema Chronic Shortness of breath exacerbated by smoking. Albuterol  used as needed, advised daily Advair . - Continue albuterol  as needed. - Ensure daily use of Advair  as prescribed. Will reassess  Tobacco use disorder Chronic Smoking half a pack daily, advised to quit to prevent emphysema exacerbation. - Encourage contacting the [employer] for smoking cessation support and free resources. 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 - Continue with counseling services for support in maintaining remission.  Hyperlipidemia Chronic Elevated cholesterol with recent weight gain, risk of exacerbation of hyperlipidemia. - Advise low cholesterol diet and regular exercise. - Plan to recheck lipid levels at next visit.  Memory loss Memory loss noted, advised to stop substance use and take multivitamins. - Continue taking multivitamins daily. - Consider follow-up with neurology if memory issues persist.  Collaboration of Care: Medication Management AEB  , Primary Care Provider AEB  , Psychiatrist AEB  , and Referral or follow-up with counselor/therapist AEB    Patient/Guardian was advised Release of Information must be obtained prior to any record release in order to collaborate their care with an outside provider. Patient/Guardian was advised if they have not already done so to contact the registration department to sign all necessary forms in order for us  to release information regarding their care.   Consent: Patient/Guardian gives verbal consent for treatment and assignment of benefits for services provided during this visit. Patient/Guardian  expressed understanding and agreed to proceed.    No orders of the defined types were placed in this  encounter.   No follow-ups on file.   The patient was advised to call back or seek an in-person evaluation if the symptoms worsen or if the condition fails to improve as anticipated.  I discussed the assessment and treatment plan with the patient. The patient was provided an opportunity to ask questions and all were answered. The patient agreed with the plan and demonstrated an understanding of the instructions.  I, Antonette Hendricks, PA-C have reviewed all documentation for this visit. The documentation on 02/19/2024  for the exam, diagnosis, procedures, and orders are all accurate and complete.  Jolynn Spencer, [facility], MMS [facility] [phone] (phone) [phone] (fax)  [facility]

## 2024-02-29 ENCOUNTER — Other Ambulatory Visit: Payer: Self-pay

## 2024-02-29 ENCOUNTER — Other Ambulatory Visit: Payer: Self-pay | Admitting: Physician Assistant

## 2024-02-29 DIAGNOSIS — J4541 Moderate persistent asthma with (acute) exacerbation: Secondary | ICD-10-CM

## 2024-02-29 MED FILL — Fluticasone-Salmeterol Aer Powder BA 100-50 MCG/ACT: RESPIRATORY_TRACT | 30 days supply | Qty: 60 | Fill #1 | Status: AC

## 2024-02-29 MED FILL — Paroxetine HCl Tab 40 MG: ORAL | 90 days supply | Qty: 90 | Fill #0 | Status: AC

## 2024-02-29 MED FILL — Gabapentin Cap 300 MG: ORAL | 30 days supply | Qty: 120 | Fill #1 | Status: AC

## 2024-03-01 ENCOUNTER — Other Ambulatory Visit: Payer: Self-pay

## 2024-03-01 MED FILL — Trazodone HCl Tab 100 MG: ORAL | 30 days supply | Qty: 60 | Fill #0 | Status: AC

## 2024-03-01 MED FILL — Albuterol Sulfate Inhal Aero 108 MCG/ACT (90MCG Base Equiv): RESPIRATORY_TRACT | 25 days supply | Qty: 6.7 | Fill #0 | Status: AC

## 2024-03-05 ENCOUNTER — Other Ambulatory Visit: Payer: Self-pay

## 2024-03-22 ENCOUNTER — Institutional Professional Consult (permissible substitution): Admitting: Licensed Clinical Social Worker

## 2024-03-22 NOTE — BH Specialist Note (Unsigned)
 Pt rescheduled appt to 04/26/24

## 2024-03-30 ENCOUNTER — Telehealth: Payer: Self-pay | Admitting: Neurology

## 2024-03-30 NOTE — Telephone Encounter (Signed)
 MYC conf

## 2024-03-31 ENCOUNTER — Ambulatory Visit: Admitting: Neurology

## 2024-04-01 ENCOUNTER — Ambulatory Visit: Admitting: Physical Medicine and Rehabilitation

## 2024-04-05 ENCOUNTER — Encounter: Payer: Self-pay | Admitting: Radiology

## 2024-04-09 ENCOUNTER — Other Ambulatory Visit: Payer: Self-pay | Admitting: Physician Assistant

## 2024-04-09 ENCOUNTER — Other Ambulatory Visit: Payer: Self-pay

## 2024-04-09 DIAGNOSIS — R0602 Shortness of breath: Secondary | ICD-10-CM

## 2024-04-09 DIAGNOSIS — F5105 Insomnia due to other mental disorder: Secondary | ICD-10-CM

## 2024-04-09 MED FILL — Potassium Chloride Microencapsulated Crys ER Tab 20 mEq: ORAL | 90 days supply | Qty: 90 | Fill #1 | Status: AC

## 2024-04-09 MED FILL — Trazodone HCl Tab 100 MG: ORAL | 30 days supply | Qty: 60 | Fill #1 | Status: CN

## 2024-04-12 ENCOUNTER — Other Ambulatory Visit: Payer: Self-pay | Admitting: Physician Assistant

## 2024-04-12 ENCOUNTER — Other Ambulatory Visit: Payer: Self-pay

## 2024-04-12 DIAGNOSIS — M24661 Ankylosis, right knee: Secondary | ICD-10-CM

## 2024-04-12 DIAGNOSIS — G8929 Other chronic pain: Secondary | ICD-10-CM

## 2024-04-12 DIAGNOSIS — R0602 Shortness of breath: Secondary | ICD-10-CM

## 2024-04-12 DIAGNOSIS — G894 Chronic pain syndrome: Secondary | ICD-10-CM

## 2024-04-12 NOTE — Telephone Encounter (Signed)
Has been sent to provider for review.

## 2024-04-13 ENCOUNTER — Other Ambulatory Visit: Payer: Self-pay

## 2024-04-13 MED ORDER — TRAZODONE HCL 100 MG PO TABS
200.0000 mg | ORAL_TABLET | Freq: Every day | ORAL | 1 refills | Status: AC
  Filled 2024-04-13 – 2024-05-08 (×2): qty 60, 30d supply, fill #0
  Filled 2024-06-14: qty 60, 30d supply, fill #1

## 2024-04-13 MED FILL — Fluticasone-Salmeterol Aer Powder BA 100-50 MCG/ACT: RESPIRATORY_TRACT | 30 days supply | Qty: 60 | Fill #0 | Status: CN

## 2024-04-13 MED FILL — Fluticasone-Salmeterol Aer Powder BA 100-50 MCG/ACT: RESPIRATORY_TRACT | 30 days supply | Qty: 60 | Fill #0 | Status: AC

## 2024-04-13 NOTE — Telephone Encounter (Signed)
 A courtesy refill before pt will follow-up with BH. Advised to decrease a dose to 150mg  .

## 2024-04-14 ENCOUNTER — Other Ambulatory Visit: Payer: Self-pay

## 2024-04-14 ENCOUNTER — Other Ambulatory Visit: Payer: Self-pay | Admitting: Physician Assistant

## 2024-04-14 MED FILL — Gabapentin Cap 300 MG: ORAL | 30 days supply | Qty: 120 | Fill #0 | Status: AC

## 2024-04-26 ENCOUNTER — Ambulatory Visit: Admitting: Licensed Clinical Social Worker

## 2024-04-26 DIAGNOSIS — Z91199 Patient's noncompliance with other medical treatment and regimen due to unspecified reason: Secondary | ICD-10-CM

## 2024-04-26 NOTE — Progress Notes (Signed)
 [facility] Collaborative Care Clinician attempted to connect with patient for scheduled appointment in office. Patient did not show up for the appointment. After waiting 15 minutes, clinician attempted to reach pt by phone to offer virtual visit.   Pt did not answer phone and clinician was unable to leave voice mail due to voice mail box full

## 2024-05-08 ENCOUNTER — Other Ambulatory Visit: Payer: Self-pay

## 2024-05-08 MED FILL — Fluticasone-Salmeterol Aer Powder BA 100-50 MCG/ACT: RESPIRATORY_TRACT | 30 days supply | Qty: 60 | Fill #1 | Status: AC

## 2024-05-10 ENCOUNTER — Other Ambulatory Visit: Payer: Self-pay

## 2024-05-11 MED FILL — Gabapentin Cap 300 MG: ORAL | 30 days supply | Qty: 120 | Fill #1 | Status: AC

## 2024-06-14 ENCOUNTER — Other Ambulatory Visit: Payer: Self-pay | Admitting: Physician Assistant

## 2024-06-14 ENCOUNTER — Ambulatory Visit (HOSPITAL_COMMUNITY): Admission: EM | Admit: 2024-06-14 | Discharge: 2024-06-14 | Disposition: A | Discharge status: Home / Self Care

## 2024-06-14 ENCOUNTER — Ambulatory Visit (INDEPENDENT_AMBULATORY_CARE_PROVIDER_SITE_OTHER)

## 2024-06-14 ENCOUNTER — Other Ambulatory Visit: Payer: Self-pay

## 2024-06-14 ENCOUNTER — Ambulatory Visit: Payer: Self-pay

## 2024-06-14 ENCOUNTER — Encounter (HOSPITAL_COMMUNITY): Payer: Self-pay | Admitting: Emergency Medicine

## 2024-06-14 DIAGNOSIS — M24661 Ankylosis, right knee: Secondary | ICD-10-CM

## 2024-06-14 DIAGNOSIS — J209 Acute bronchitis, unspecified: Secondary | ICD-10-CM

## 2024-06-14 DIAGNOSIS — R0602 Shortness of breath: Secondary | ICD-10-CM

## 2024-06-14 DIAGNOSIS — G8929 Other chronic pain: Secondary | ICD-10-CM

## 2024-06-14 DIAGNOSIS — G894 Chronic pain syndrome: Secondary | ICD-10-CM

## 2024-06-14 MED ORDER — FLUTICASONE-SALMETEROL 100-50 MCG/ACT IN AEPB
1.0000 | INHALATION_SPRAY | Freq: Two times a day (BID) | RESPIRATORY_TRACT | 1 refills | Status: AC
  Filled 2024-06-14: qty 60, 30d supply, fill #0

## 2024-06-14 MED ORDER — AZELASTINE HCL 0.1 % NA SOLN: 1.0000 | Freq: Two times a day (BID) | NASAL | 0 refills | Status: AC

## 2024-06-14 MED ORDER — PROMETHAZINE-DM 6.25-15 MG/5ML PO SYRP: 10.0000 mL | ORAL_SOLUTION | Freq: Three times a day (TID) | ORAL | 0 refills | Status: AC | PRN

## 2024-06-14 MED ORDER — PREDNISONE 20 MG PO TABS: 40.0000 mg | ORAL_TABLET | Freq: Every day | ORAL | 0 refills | Status: AC

## 2024-06-14 NOTE — Discharge Instructions (Signed)
" °  1. Acute bronchitis, unspecified organism (Primary) - DG Chest 2 View x-ray completed today in UC shows no acute cardiopulmonary processes, no sign of consolidation or pneumonia. - azelastine  (ASTELIN ) 0.1 % nasal spray; Place 1 spray into both nostrils 2 (two) times daily. Use in each nostril as directed  Dispense: 30 mL; Refill: 0 - promethazine -dextromethorphan (PROMETHAZINE -DM) 6.25-15 MG/5ML syrup; Take 10 mLs by mouth 3 (three) times daily as needed for cough.  Dispense: 240 mL; Refill: 0 - predniSONE  (DELTASONE ) 20 MG tablet; Take 2 tablets (40 mg total) by mouth daily for 5 days.  Dispense: 10 tablet; Refill: 0  -Continue to monitor symptoms for any change in severity if there is any escalation of current symptoms or development of new symptoms follow-up in ER for further evaluation and management. "

## 2024-06-14 NOTE — ED Triage Notes (Signed)
 Pt had cough for almost two months. Taking Tylenol  and cold medication that is not helping. Reports spitting out orangish mucous. Pt reports that sometimes has pain in chest when walking or laying down for 2 months as well.

## 2024-06-14 NOTE — Telephone Encounter (Signed)
 FYI Only or Action Required?: FYI only for provider: UC advised.  Patient was last seen in primary care on 02/19/2024 by Ostwalt, Janna, PA-C.  Called Nurse Triage reporting Cough and Wheezing.  Symptoms began several weeks ago.  Interventions attempted: OTC medications: dayquil, nyquil and Rest, hydration, or home remedies.  Symptoms are: gradually worsening.  Triage Disposition: See HCP Within 4 Hours (Or PCP Triage)  Patient/caregiver understands and will follow disposition?: YesCopied from CRM #[id number]. Topic: Clinical - Red Word Triage >> Jun 14, 2024  9:11 AM Kenneth Bautista wrote: Red Word that prompted transfer to Nurse Triage: Bad cough, wheezing, mucous - orange/brown Reason for Disposition  [1] MILD difficulty breathing (e.g., minimal/no SOB at rest, SOB with walking, pulse < 100) AND [2] still present when not coughing  Answer Assessment - Initial Assessment Questions Cough productive  (orange brown), sinus congestion, sore throat for about a month now. Cough worse with activity. SOB with cough. Laying down coughs worse. Denies ear ache. Not sleeping well  Denies ear ache  Nyquil/dayquil Albuterol  and Advair -   Patient no longer living in the [place] area and can't get up there for an acute appt today. Advised UC for urgent assessment or ED. He understands and will be seen in UC on [address]    1. ONSET: When did the cough begin?      A month  2. SEVERITY: How bad is the cough today?      Hard to talk, worse with laying down and activity 3. SPUTUM: Describe the color of your sputum (e.g., none, dry cough; clear, white, yellow, green)     Brownish orangish color 4. HEMOPTYSIS: Are you coughing up any blood? If Yes, ask: How much? (e.g., flecks, streaks, tablespoons, etc.)     denies 5. DIFFICULTY BREATHING: Are you having difficulty breathing? If Yes, ask: How bad is it? (e.g., mild, moderate, severe)      Activity, coughing spells gets SOB 6. FEVER: Do  you have a fever? If Yes, ask: What is your temperature, how was it measured, and when did it start?     Unsure- has chills  7. CARDIAC HISTORY: Do you have any history of heart disease? (e.g., heart attack, congestive heart failure)      denies 8. LUNG HISTORY: Do you have any history of lung disease?  (e.g., pulmonary embolus, asthma, emphysema)    Asthma, emphysema  9. PE RISK FACTORS: Do you have a history of blood clots? (or: recent major surgery, recent prolonged travel, bedridden)     denies 10. OTHER SYMPTOMS: Do you have any other symptoms? (e.g., runny nose, wheezing, chest pain)       Wheezing, chest tightness with coughing  Protocols used: Cough - Acute Productive-A-AH

## 2024-06-14 NOTE — ED Provider Notes (Signed)
 " [facility]  Note:  This document was prepared using Dragon voice recognition software and may include unintentional dictation errors.  MRN: [id number] DOB: [birth year]  Subjective:   Kenneth Bautista is a 39 y.o. male presenting for persistent cough x 2 months.  Patient also reports mucus production, mild shortness of breath and chest discomfort when laying down or walking.  Denies any fever, body aches, weakness, dizziness.  Patient admits to taking Tylenol  and over-the-counter cough medication with minimal improvement.  Patient reports in the past he used diagnosed with COPD and has been prescribed albuterol  and Advair  for control of symptoms.  Current Medications[1]   Allergies[2]  Past Medical History:  Diagnosis Date   Arthritis    Asthma    Atypical angina    Dyspnea    Epigastric hernia    GERD (gastroesophageal reflux disease)    History of 2019 novel coronavirus disease (COVID-19) 06/24/2019   Pneumonia    Polysubstance abuse (HCC)    ETOH, marijuana, cocaine, amphetamines   Tachycardia      Past Surgical History:  Procedure Laterality Date   ANTERIOR CRUCIATE LIGAMENT REPAIR Right 02/01/2021   Procedure: RECONSTRUCTION ANTERIOR CRUCIATE LIGAMENT (ACL);  Surgeon: Cristy Bonner DASEN, MD;  Location: [facility] OR;  Service: Orthopedics;  Laterality: Right;   EPIGASTRIC HERNIA REPAIR N/A 11/15/2020   Procedure: HERNIA REPAIR EPIGASTRIC ADULT, open;  Surgeon: Marolyn Nest, MD;  Location: [facility] ORS;  Service: General;  Laterality: N/A;   EXTERNAL FIXATION LEG Right 01/27/2021   Procedure: EXTERNAL FIXATION KNEE;  Surgeon: Kendal Franky SQUIBB, MD;  Location: [facility] OR;  Service: Orthopedics;  Laterality: Right;   HERNIA REPAIR     KNEE ARTHROSCOPY Right 08/06/2021   Procedure: RIGHT KNEE ARTHROSCOPIC LYSIS OF ADHESIONS AND MUNIPULATION UNDER ANESTHESIA / POSSIBLE LATERAL EXRA-ARTICULAR TENODESIS;  Surgeon: Genelle Standing, MD;  Location: [facility] OR;  Service: Orthopedics;   Laterality: Right;   None     ORIF ELBOW FRACTURE Right 01/27/2021   Procedure: OPEN REDUCTION INTERNAL FIXATION (ORIF) ELBOW/OLECRANON FRACTURE;  Surgeon: Kendal Franky SQUIBB, MD;  Location: [facility] OR;  Service: Orthopedics;  Laterality: Right;   ORIF TIBIA FRACTURE Left 01/27/2021   Procedure: OPEN REDUCTION INTERNAL FIXATION (ORIF) TIBIA FRACTURE;  Surgeon: Kendal Franky SQUIBB, MD;  Location: [facility] OR;  Service: Orthopedics;  Laterality: Left;   POSTERIOR CRUCIATE LIGAMENT RECONSTRUCTION Right 02/01/2021   Procedure: RECONSTRUCTION POSTERIOR CRUCIATE LIGAMENT (PCL);  Surgeon: Cristy Bonner DASEN, MD;  Location: [facility] OR;  Service: Orthopedics;  Laterality: Right;    Family History  Problem Relation Age of Onset   Heart disease Mother        details unknown   Heart disease Father        details unknown    Social History[3]  ROS Refer to HPI for ROS details.  Objective:    Vitals: BP 107/65 (BP Location: Left Arm)   Pulse 85   Temp 97.9 F (36.6 C) (Oral)   Resp 17   SpO2 96%   Physical Exam Vitals and nursing note reviewed.  Constitutional:      General: He is not in acute distress.    Appearance: Normal appearance. He is well-developed. He is not ill-appearing or toxic-appearing.  HENT:     Head: Normocephalic.     Nose: Nose normal. No congestion or rhinorrhea.     Mouth/Throat:     Mouth: Mucous membranes are moist.  Cardiovascular:     Rate and Rhythm: Normal rate.  Pulmonary:  Effort: Pulmonary effort is normal. No respiratory distress.     Breath sounds: No stridor. No wheezing.  Skin:    General: Skin is warm and dry.  Neurological:     General: No focal deficit present.     Mental Status: He is alert and oriented to person, place, and time.  Psychiatric:        Mood and Affect: Mood normal.        Behavior: Behavior normal.     Procedures  No results found for this or any previous visit (from the past 24 hours).  Assessment and Plan :     Discharge Instructions        1. Acute bronchitis, unspecified organism (Primary) - DG Chest 2 View x-ray completed today in UC shows no acute cardiopulmonary processes, no sign of consolidation or pneumonia. - azelastine  (ASTELIN ) 0.1 % nasal spray; Place 1 spray into both nostrils 2 (two) times daily. Use in each nostril as directed  Dispense: 30 mL; Refill: 0 - promethazine -dextromethorphan (PROMETHAZINE -DM) 6.25-15 MG/5ML syrup; Take 10 mLs by mouth 3 (three) times daily as needed for cough.  Dispense: 240 mL; Refill: 0 - predniSONE  (DELTASONE ) 20 MG tablet; Take 2 tablets (40 mg total) by mouth daily for 5 days.  Dispense: 10 tablet; Refill: 0  -Continue to monitor symptoms for any change in severity if there is any escalation of current symptoms or development of new symptoms follow-up in ER for further evaluation and management.      Lola Lofaro B Annalynn Centanni    [1] No current facility-administered medications for this encounter.  Current Outpatient Medications:    azelastine  (ASTELIN ) 0.1 % nasal spray, Place 1 spray into both nostrils 2 (two) times daily. Use in each nostril as directed, Disp: 30 mL, Rfl: 0   predniSONE  (DELTASONE ) 20 MG tablet, Take 2 tablets (40 mg total) by mouth daily for 5 days., Disp: 10 tablet, Rfl: 0   promethazine -dextromethorphan (PROMETHAZINE -DM) 6.25-15 MG/5ML syrup, Take 10 mLs by mouth 3 (three) times daily as needed for cough., Disp: 240 mL, Rfl: 0   acetaminophen  (ACETAMINOPHEN  8 HOUR) 650 MG CR tablet, Take 1 tablet (650 mg total) by mouth every 8 (eight) hours as needed for pain., Disp: 100 tablet, Rfl: 0   albuterol  (VENTOLIN  HFA) 108 (90 Base) MCG/ACT inhaler, Inhale 2 puffs into the lungs every 6 (six) hours as needed for wheezing or shortness of breath., Disp: 6.7 g, Rfl: 0   aspirin  EC 325 MG tablet, Take 1 tablet (325 mg total) by mouth daily., Disp: 30 tablet, Rfl: 0   diclofenac  Sodium (VOLTAREN ) 1 % GEL, Apply 2 grams topically 4 (four) times daily., Disp: 200 g, Rfl:  2   donepezil  (ARICEPT ) 5 MG tablet, Take 1 tablet (5 mg total) by mouth at bedtime., Disp: 30 tablet, Rfl: 5   fluticasone -salmeterol (ADVAIR ) 100-50 MCG/ACT AEPB, Inhale 1 puff into the lungs 2 (two) times daily., Disp: 60 each, Rfl: 1   gabapentin  (NEURONTIN ) 300 MG capsule, Take 1 capsule (300 mg total) by mouth 2 (two) times daily AND 2 capsules (600 mg total) at bedtime. Do not take with alcohol., Disp: 120 capsule, Rfl: 1   hydrOXYzine  (ATARAX ) 25 MG tablet, Take 1 tablet (25 mg total) by mouth at bedtime., Disp: 30 tablet, Rfl: 0   PARoxetine  (PAXIL ) 40 MG tablet, Take 1 tablet (40 mg total) by mouth daily., Disp: 90 tablet, Rfl: 1   PARoxetine  (PAXIL ) 40 MG tablet, Take 1 tablet (40 mg total)  by mouth daily., Disp: 90 tablet, Rfl: 0   potassium chloride  SA (KLOR-CON  M) 20 MEQ tablet, Take 1 tablet (20 mEq total) by mouth daily., Disp: 90 tablet, Rfl: 1   traZODone  (DESYREL ) 100 MG tablet, Take 2 tablets (200 mg total) by mouth at bedtime., Disp: 60 tablet, Rfl: 1 [2]  Allergies Allergen Reactions   Eicosapentaenoic Acid Other (See Comments)   Omega-3 Fatty Acids (Plant)   [3]  Social History Tobacco Use   Smoking status: Every Day    Current packs/day: 0.50    Average packs/day: 0.5 packs/day for 20.0 years (10.0 ttl pk-yrs)    Types: Cigarettes   Smokeless tobacco: Never  Vaping Use   Vaping status: Every Day   Substances: Nicotine   Substance Use Topics   Alcohol use: Yes    Comment: none right now sober since accident.   Drug use: Not Currently    Types: Cocaine, Amphetamines, Marijuana    Comment: has not used drugs since Augest 1st     Thy Gullikson B, NP 06/14/24 1418  "

## 2024-06-14 NOTE — Telephone Encounter (Signed)
 LOV 02/19/24 NOV 06/21/24 LRF 04/13/24 qty:120 r:1

## 2024-06-15 ENCOUNTER — Other Ambulatory Visit: Payer: Self-pay

## 2024-06-17 ENCOUNTER — Other Ambulatory Visit: Payer: Self-pay

## 2024-06-17 MED FILL — Gabapentin Cap 300 MG: ORAL | 30 days supply | Qty: 120 | Fill #0 | Status: AC

## 2024-06-19 ENCOUNTER — Other Ambulatory Visit: Payer: Self-pay

## 2024-06-21 ENCOUNTER — Ambulatory Visit: Admitting: Physician Assistant

## 2024-06-25 ENCOUNTER — Telehealth: Payer: Self-pay

## 2024-06-25 NOTE — Telephone Encounter (Signed)
 Copied from CRM #[id number]. Topic: Appointments - Transfer of Care >> Jun 24, 2024  2:02 PM Pinkey ORN wrote: Pt is requesting to transfer FROM: [facility] Pt is requesting to transfer TO: [facility] Reason for requested transfer: Patient didn't provide a reason for transfer.  It is the responsibility of the team the patient would like to transfer to Gracie Blush, MD) to reach out to the patient if for any reason this transfer is not acceptable.

## 2024-08-09 ENCOUNTER — Encounter: Payer: Self-pay | Admitting: Family Medicine
